# Patient Record
Sex: Male | Born: 1950
Health system: Southern US, Community
[De-identification: ages and names within clinical notes are randomized; demographics above are authoritative.]

## PROBLEM LIST (undated history)

## (undated) DIAGNOSIS — B1920 Unspecified viral hepatitis C without hepatic coma: Secondary | ICD-10-CM

## (undated) DIAGNOSIS — I10 Essential (primary) hypertension: Secondary | ICD-10-CM

## (undated) DIAGNOSIS — E7800 Pure hypercholesterolemia, unspecified: Secondary | ICD-10-CM

## (undated) DIAGNOSIS — F1020 Alcohol dependence, uncomplicated: Secondary | ICD-10-CM

## (undated) DIAGNOSIS — I219 Acute myocardial infarction, unspecified: Secondary | ICD-10-CM

## (undated) DIAGNOSIS — E119 Type 2 diabetes mellitus without complications: Secondary | ICD-10-CM

## (undated) DIAGNOSIS — N400 Enlarged prostate without lower urinary tract symptoms: Secondary | ICD-10-CM

## (undated) DIAGNOSIS — D649 Anemia, unspecified: Secondary | ICD-10-CM

## (undated) HISTORY — PX: HEMORRHOID SURGERY: SHX153

## (undated) HISTORY — PX: HERNIA REPAIR: SHX51

## (undated) HISTORY — PX: KNEE ARTHROPLASTY: SHX992

---

## 2018-12-25 ENCOUNTER — Emergency Department (HOSPITAL_COMMUNITY): Payer: No Typology Code available for payment source

## 2018-12-25 ENCOUNTER — Inpatient Hospital Stay (HOSPITAL_COMMUNITY)
Admission: EM | Admit: 2018-12-25 | Discharge: 2018-12-26 | DRG: 286 | Disposition: A | Discharge status: Home / Self Care | Payer: No Typology Code available for payment source | Attending: Cardiology | Admitting: Cardiology

## 2018-12-25 ENCOUNTER — Encounter (HOSPITAL_COMMUNITY)
Admission: EM | Disposition: A | Discharge status: Home / Self Care | Payer: Self-pay | Source: Home / Self Care | Attending: Cardiology

## 2018-12-25 ENCOUNTER — Other Ambulatory Visit: Payer: Self-pay

## 2018-12-25 ENCOUNTER — Encounter (HOSPITAL_COMMUNITY): Payer: Self-pay | Admitting: Emergency Medicine

## 2018-12-25 DIAGNOSIS — Z79899 Other long term (current) drug therapy: Secondary | ICD-10-CM

## 2018-12-25 DIAGNOSIS — E669 Obesity, unspecified: Secondary | ICD-10-CM | POA: Diagnosis present

## 2018-12-25 DIAGNOSIS — F17210 Nicotine dependence, cigarettes, uncomplicated: Secondary | ICD-10-CM | POA: Diagnosis present

## 2018-12-25 DIAGNOSIS — I5033 Acute on chronic diastolic (congestive) heart failure: Secondary | ICD-10-CM | POA: Diagnosis present

## 2018-12-25 DIAGNOSIS — I426 Alcoholic cardiomyopathy: Secondary | ICD-10-CM | POA: Diagnosis present

## 2018-12-25 DIAGNOSIS — R9431 Abnormal electrocardiogram [ECG] [EKG]: Secondary | ICD-10-CM | POA: Diagnosis present

## 2018-12-25 DIAGNOSIS — E785 Hyperlipidemia, unspecified: Secondary | ICD-10-CM | POA: Diagnosis present

## 2018-12-25 DIAGNOSIS — Z7984 Long term (current) use of oral hypoglycemic drugs: Secondary | ICD-10-CM | POA: Diagnosis not present

## 2018-12-25 DIAGNOSIS — F17200 Nicotine dependence, unspecified, uncomplicated: Secondary | ICD-10-CM | POA: Diagnosis not present

## 2018-12-25 DIAGNOSIS — I420 Dilated cardiomyopathy: Secondary | ICD-10-CM

## 2018-12-25 DIAGNOSIS — I214 Non-ST elevation (NSTEMI) myocardial infarction: Secondary | ICD-10-CM | POA: Diagnosis present

## 2018-12-25 DIAGNOSIS — I10 Essential (primary) hypertension: Secondary | ICD-10-CM

## 2018-12-25 DIAGNOSIS — Z720 Tobacco use: Secondary | ICD-10-CM | POA: Diagnosis not present

## 2018-12-25 DIAGNOSIS — I110 Hypertensive heart disease with heart failure: Secondary | ICD-10-CM | POA: Diagnosis present

## 2018-12-25 DIAGNOSIS — E7800 Pure hypercholesterolemia, unspecified: Secondary | ICD-10-CM | POA: Diagnosis present

## 2018-12-25 DIAGNOSIS — F1010 Alcohol abuse, uncomplicated: Secondary | ICD-10-CM | POA: Diagnosis not present

## 2018-12-25 DIAGNOSIS — E119 Type 2 diabetes mellitus without complications: Secondary | ICD-10-CM | POA: Diagnosis present

## 2018-12-25 DIAGNOSIS — R0789 Other chest pain: Secondary | ICD-10-CM

## 2018-12-25 DIAGNOSIS — R05 Cough: Secondary | ICD-10-CM | POA: Diagnosis present

## 2018-12-25 DIAGNOSIS — R197 Diarrhea, unspecified: Secondary | ICD-10-CM | POA: Diagnosis present

## 2018-12-25 DIAGNOSIS — I5043 Acute on chronic combined systolic (congestive) and diastolic (congestive) heart failure: Secondary | ICD-10-CM | POA: Diagnosis not present

## 2018-12-25 DIAGNOSIS — Z20828 Contact with and (suspected) exposure to other viral communicable diseases: Secondary | ICD-10-CM | POA: Diagnosis present

## 2018-12-25 DIAGNOSIS — I200 Unstable angina: Principal | ICD-10-CM | POA: Diagnosis present

## 2018-12-25 DIAGNOSIS — Z791 Long term (current) use of non-steroidal anti-inflammatories (NSAID): Secondary | ICD-10-CM

## 2018-12-25 DIAGNOSIS — F1020 Alcohol dependence, uncomplicated: Secondary | ICD-10-CM | POA: Diagnosis present

## 2018-12-25 DIAGNOSIS — R0602 Shortness of breath: Secondary | ICD-10-CM | POA: Diagnosis not present

## 2018-12-25 HISTORY — DX: Essential (primary) hypertension: I10

## 2018-12-25 HISTORY — DX: Type 2 diabetes mellitus without complications: E11.9

## 2018-12-25 HISTORY — PX: LEFT HEART CATH AND CORONARY ANGIOGRAPHY: CATH118249

## 2018-12-25 HISTORY — DX: Pure hypercholesterolemia, unspecified: E78.00

## 2018-12-25 LAB — COMPREHENSIVE METABOLIC PANEL
ALT: 34 U/L (ref 0–44)
AST: 39 U/L (ref 15–41)
Albumin: 3.5 g/dL (ref 3.5–5.0)
Alkaline Phosphatase: 60 U/L (ref 38–126)
Anion gap: 12 (ref 5–15)
BUN: 9 mg/dL (ref 8–23)
CO2: 20 mmol/L — ABNORMAL LOW (ref 22–32)
Calcium: 9.1 mg/dL (ref 8.9–10.3)
Chloride: 111 mmol/L (ref 98–111)
Creatinine, Ser: 1.22 mg/dL (ref 0.61–1.24)
GFR calc Af Amer: >60 mL/min (ref >60)
GFR calc non Af Amer: >60 mL/min (ref >60)
Glucose, Bld: 127 mg/dL — ABNORMAL HIGH (ref 70–99)
Potassium: 3.5 mmol/L (ref 3.5–5.1)
Sodium: 143 mmol/L (ref 135–145)
Total Bilirubin: 0.6 mg/dL (ref 0.3–1.2)
Total Protein: 7 g/dL (ref 6.5–8.1)

## 2018-12-25 LAB — CBC WITH DIFFERENTIAL/PLATELET
Abs Immature Granulocytes: 0.01 10*3/uL (ref 0.00–0.07)
Basophils Absolute: 0 10*3/uL (ref 0.0–0.1)
Basophils Relative: 0 %
Eosinophils Absolute: 0.2 10*3/uL (ref 0.0–0.5)
Eosinophils Relative: 3 %
HCT: 39.8 % (ref 39.0–52.0)
Hemoglobin: 12.8 g/dL — ABNORMAL LOW (ref 13.0–17.0)
Immature Granulocytes: 0 %
Lymphocytes Relative: 29 %
Lymphs Abs: 2 10*3/uL (ref 0.7–4.0)
MCH: 31.8 pg (ref 26.0–34.0)
MCHC: 32.2 g/dL (ref 30.0–36.0)
MCV: 98.8 fL (ref 80.0–100.0)
Monocytes Absolute: 0.7 10*3/uL (ref 0.1–1.0)
Monocytes Relative: 10 %
Neutro Abs: 4 10*3/uL (ref 1.7–7.7)
Neutrophils Relative %: 58 %
Platelets: 274 10*3/uL (ref 150–400)
RBC: 4.03 MIL/uL — ABNORMAL LOW (ref 4.22–5.81)
RDW: 15.3 % (ref 11.5–15.5)
WBC: 6.9 10*3/uL (ref 4.0–10.5)
nRBC: 0 % (ref 0.0–0.2)

## 2018-12-25 LAB — HEMOGLOBIN A1C
Hgb A1c MFr Bld: 6.3 % — ABNORMAL HIGH (ref 4.8–5.6)
Mean Plasma Glucose: 134.11 mg/dL

## 2018-12-25 LAB — TSH: TSH: 0.999 u[IU]/mL (ref 0.350–4.500)

## 2018-12-25 LAB — TROPONIN I (HIGH SENSITIVITY)
Troponin I (High Sensitivity): 193 ng/L (ref <18)
Troponin I (High Sensitivity): 192 ng/L (ref <18)

## 2018-12-25 LAB — D-DIMER, QUANTITATIVE: D-Dimer, Quant: 0.89 ug/mL-FEU — ABNORMAL HIGH (ref 0.00–0.50)

## 2018-12-25 LAB — LIPID PANEL
Cholesterol: 146 mg/dL (ref 0–200)
HDL: 73 mg/dL (ref >40)
LDL Cholesterol: 63 mg/dL (ref 0–99)
Total CHOL/HDL Ratio: 2 RATIO
Triglycerides: 52 mg/dL (ref <150)
VLDL: 10 mg/dL (ref 0–40)

## 2018-12-25 LAB — SARS CORONAVIRUS 2 BY RT PCR (HOSPITAL ORDER, PERFORMED IN ~~LOC~~ HOSPITAL LAB): SARS Coronavirus 2: NEGATIVE

## 2018-12-25 LAB — GLUCOSE, CAPILLARY: Glucose-Capillary: 109 mg/dL — ABNORMAL HIGH (ref 70–99)

## 2018-12-25 LAB — BRAIN NATRIURETIC PEPTIDE: B Natriuretic Peptide: 312.2 pg/mL — ABNORMAL HIGH (ref 0.0–100.0)

## 2018-12-25 SURGERY — LEFT HEART CATH AND CORONARY ANGIOGRAPHY
Anesthesia: LOCAL

## 2018-12-25 MED ORDER — HEPARIN BOLUS VIA INFUSION
4000.0000 [IU] | Freq: Once | INTRAVENOUS | Status: AC
  Administered 2018-12-25: 12:00:00 4000 [IU] via INTRAVENOUS
  Filled 2018-12-25: qty 4000

## 2018-12-25 MED ORDER — ENALAPRIL MALEATE 10 MG PO TABS
10.0000 mg | ORAL_TABLET | Freq: Every day | ORAL | Status: DC
  Administered 2018-12-25 – 2018-12-26 (×2): 10 mg via ORAL
  Filled 2018-12-25 (×2): qty 1

## 2018-12-25 MED ORDER — HEPARIN SODIUM (PORCINE) 1000 UNIT/ML IJ SOLN
INTRAMUSCULAR | Status: DC | PRN
  Administered 2018-12-25: 6000 [IU] via INTRAVENOUS

## 2018-12-25 MED ORDER — ASPIRIN 81 MG PO CHEW: 324.0000 mg | CHEWABLE_TABLET | ORAL | Status: DC

## 2018-12-25 MED ORDER — HEPARIN (PORCINE) IN NACL 1000-0.9 UT/500ML-% IV SOLN
INTRAVENOUS | Status: DC | PRN
  Administered 2018-12-25: 500 mL

## 2018-12-25 MED ORDER — FENTANYL CITRATE (PF) 100 MCG/2ML IJ SOLN
INTRAMUSCULAR | Status: DC | PRN
  Administered 2018-12-25: 50 ug via INTRAVENOUS

## 2018-12-25 MED ORDER — POTASSIUM CHLORIDE CRYS ER 10 MEQ PO TBCR
20.0000 meq | EXTENDED_RELEASE_TABLET | Freq: Two times a day (BID) | ORAL | Status: AC
  Administered 2018-12-25 – 2018-12-26 (×2): 20 meq via ORAL
  Filled 2018-12-25 (×3): qty 2

## 2018-12-25 MED ORDER — FUROSEMIDE 10 MG/ML IJ SOLN
40.0000 mg | Freq: Two times a day (BID) | INTRAMUSCULAR | Status: DC
  Administered 2018-12-26: 07:00:00 40 mg via INTRAVENOUS
  Filled 2018-12-25: qty 4

## 2018-12-25 MED ORDER — MIDAZOLAM HCL 2 MG/2ML IJ SOLN
INTRAMUSCULAR | Status: AC
  Filled 2018-12-25: qty 2

## 2018-12-25 MED ORDER — HEPARIN (PORCINE) IN NACL 1000-0.9 UT/500ML-% IV SOLN
INTRAVENOUS | Status: AC
  Filled 2018-12-25: qty 1000

## 2018-12-25 MED ORDER — SODIUM CHLORIDE 0.9% FLUSH: 3.0000 mL | INTRAVENOUS | Status: DC | PRN

## 2018-12-25 MED ORDER — ASPIRIN 300 MG RE SUPP: 300.0000 mg | RECTAL | Status: DC

## 2018-12-25 MED ORDER — SODIUM CHLORIDE 0.9% FLUSH
3.0000 mL | Freq: Two times a day (BID) | INTRAVENOUS | Status: DC
  Administered 2018-12-25: 3 mL via INTRAVENOUS

## 2018-12-25 MED ORDER — TRAZODONE HCL 50 MG PO TABS: 150.0000 mg | ORAL_TABLET | Freq: Every evening | ORAL | Status: DC | PRN

## 2018-12-25 MED ORDER — ONDANSETRON HCL 4 MG/2ML IJ SOLN: 4.0000 mg | Freq: Four times a day (QID) | INTRAMUSCULAR | Status: DC | PRN

## 2018-12-25 MED ORDER — INSULIN ASPART 100 UNIT/ML ~~LOC~~ SOLN
0.0000 [IU] | Freq: Three times a day (TID) | SUBCUTANEOUS | Status: DC
  Administered 2018-12-26: 3 [IU] via SUBCUTANEOUS

## 2018-12-25 MED ORDER — HEPARIN (PORCINE) 25000 UT/250ML-% IV SOLN
1100.0000 [IU]/h | INTRAVENOUS | Status: DC
  Administered 2018-12-25: 12:00:00 1100 [IU]/h via INTRAVENOUS
  Filled 2018-12-25: qty 250

## 2018-12-25 MED ORDER — ATORVASTATIN CALCIUM 80 MG PO TABS: 80.0000 mg | ORAL_TABLET | Freq: Every day | ORAL | Status: DC

## 2018-12-25 MED ORDER — NITROGLYCERIN 0.4 MG SL SUBL: 0.4000 mg | SUBLINGUAL_TABLET | SUBLINGUAL | Status: DC | PRN

## 2018-12-25 MED ORDER — LURASIDONE HCL 60 MG PO TABS: 60.0000 mg | ORAL_TABLET | Freq: Every day | ORAL | Status: DC

## 2018-12-25 MED ORDER — LIDOCAINE HCL (PF) 1 % IJ SOLN
INTRAMUSCULAR | Status: DC | PRN
  Administered 2018-12-25: 5 mL

## 2018-12-25 MED ORDER — SODIUM CHLORIDE 0.9 % IV BOLUS
1000.0000 mL | Freq: Once | INTRAVENOUS | Status: AC
  Administered 2018-12-25: 10:00:00 1000 mL via INTRAVENOUS

## 2018-12-25 MED ORDER — ASPIRIN EC 81 MG PO TBEC: 81.0000 mg | DELAYED_RELEASE_TABLET | Freq: Every day | ORAL | Status: DC

## 2018-12-25 MED ORDER — LIDOCAINE HCL (PF) 1 % IJ SOLN
INTRAMUSCULAR | Status: AC
  Filled 2018-12-25: qty 30

## 2018-12-25 MED ORDER — DIAZEPAM 5 MG PO TABS
10.0000 mg | ORAL_TABLET | Freq: Three times a day (TID) | ORAL | Status: DC
  Administered 2018-12-25: 10 mg via ORAL
  Filled 2018-12-25 (×2): qty 2

## 2018-12-25 MED ORDER — FENTANYL CITRATE (PF) 100 MCG/2ML IJ SOLN
INTRAMUSCULAR | Status: AC
  Filled 2018-12-25: qty 2

## 2018-12-25 MED ORDER — HYDRALAZINE HCL 20 MG/ML IJ SOLN
10.0000 mg | INTRAMUSCULAR | Status: AC | PRN
  Administered 2018-12-25 (×2): 10 mg via INTRAVENOUS
  Filled 2018-12-25: qty 1

## 2018-12-25 MED ORDER — VERAPAMIL HCL 2.5 MG/ML IV SOLN
INTRAVENOUS | Status: DC | PRN
  Administered 2018-12-25: 18:00:00 5 mL via INTRA_ARTERIAL

## 2018-12-25 MED ORDER — VERAPAMIL HCL 2.5 MG/ML IV SOLN
INTRAVENOUS | Status: AC
  Filled 2018-12-25: qty 2

## 2018-12-25 MED ORDER — PANTOPRAZOLE SODIUM 40 MG PO TBEC
40.0000 mg | DELAYED_RELEASE_TABLET | Freq: Two times a day (BID) | ORAL | Status: DC
  Administered 2018-12-26: 09:00:00 40 mg via ORAL
  Filled 2018-12-25: qty 1

## 2018-12-25 MED ORDER — MIDAZOLAM HCL 2 MG/2ML IJ SOLN
INTRAMUSCULAR | Status: DC | PRN
  Administered 2018-12-25: 2 mg via INTRAVENOUS

## 2018-12-25 MED ORDER — AMLODIPINE BESYLATE 5 MG PO TABS
5.0000 mg | ORAL_TABLET | Freq: Every day | ORAL | Status: DC
  Administered 2018-12-26: 11:00:00 5 mg via ORAL
  Filled 2018-12-25: qty 1

## 2018-12-25 MED ORDER — ASPIRIN 81 MG PO CHEW
324.0000 mg | CHEWABLE_TABLET | Freq: Once | ORAL | Status: AC
  Administered 2018-12-25: 324 mg via ORAL
  Filled 2018-12-25: qty 4

## 2018-12-25 MED ORDER — ENALAPRIL MALEATE 10 MG PO TABS
10.0000 mg | ORAL_TABLET | Freq: Every day | ORAL | Status: DC
  Filled 2018-12-25: qty 1

## 2018-12-25 MED ORDER — SODIUM CHLORIDE 0.9 % IV SOLN: 250.0000 mL | INTRAVENOUS | Status: DC | PRN

## 2018-12-25 MED ORDER — HEPARIN SODIUM (PORCINE) 1000 UNIT/ML IJ SOLN
INTRAMUSCULAR | Status: AC
  Filled 2018-12-25: qty 1

## 2018-12-25 MED ORDER — IOHEXOL 350 MG/ML SOLN
INTRAVENOUS | Status: DC | PRN
  Administered 2018-12-25: 18:00:00 70 mL via INTRA_ARTERIAL

## 2018-12-25 MED ORDER — METOPROLOL TARTRATE 25 MG PO TABS
25.0000 mg | ORAL_TABLET | Freq: Two times a day (BID) | ORAL | Status: DC
  Administered 2018-12-26: 11:00:00 25 mg via ORAL
  Filled 2018-12-25 (×2): qty 1

## 2018-12-25 MED ORDER — GLIPIZIDE 5 MG PO TABS
5.0000 mg | ORAL_TABLET | Freq: Two times a day (BID) | ORAL | Status: DC
  Administered 2018-12-26: 5 mg via ORAL
  Filled 2018-12-25 (×2): qty 1

## 2018-12-25 MED ORDER — ACETAMINOPHEN 325 MG PO TABS
650.0000 mg | ORAL_TABLET | ORAL | Status: DC | PRN
  Administered 2018-12-25: 21:00:00 650 mg via ORAL
  Filled 2018-12-25: qty 2

## 2018-12-25 MED ORDER — IOHEXOL 350 MG/ML SOLN
100.0000 mL | Freq: Once | INTRAVENOUS | Status: AC | PRN
  Administered 2018-12-25: 13:00:00 100 mL via INTRAVENOUS

## 2018-12-25 MED ORDER — LABETALOL HCL 5 MG/ML IV SOLN
10.0000 mg | INTRAVENOUS | Status: AC | PRN
  Administered 2018-12-25: 20:00:00 10 mg via INTRAVENOUS
  Filled 2018-12-25: qty 4

## 2018-12-25 SURGICAL SUPPLY — 10 items
CATH OPTITORQUE TIG 4.0 5F (CATHETERS) ×1 IMPLANT
DEVICE RAD COMP TR BAND LRG (VASCULAR PRODUCTS) ×1 IMPLANT
GLIDESHEATH SLEND A-KIT 6F 22G (SHEATH) ×1 IMPLANT
GUIDEWIRE INQWIRE 1.5J.035X260 (WIRE) IMPLANT
INQWIRE 1.5J .035X260CM (WIRE) ×4
KIT HEART LEFT (KITS) ×2 IMPLANT
PACK CARDIAC CATHETERIZATION (CUSTOM PROCEDURE TRAY) ×2 IMPLANT
SHEATH PROBE COVER 6X72 (BAG) ×1 IMPLANT
TRANSDUCER W/STOPCOCK (MISCELLANEOUS) ×2 IMPLANT
TUBING CIL FLEX 10 FLL-RA (TUBING) ×2 IMPLANT

## 2018-12-25 NOTE — Progress Notes (Signed)
TR BAND REMOVAL  LOCATION:    right radial  DEFLATED PER PROTOCOL:    Yes.    TIME BAND OFF / DRESSING APPLIED:    2145   SITE UPON ARRIVAL:    Level 0  SITE AFTER BAND REMOVAL:    Level 0  CIRCULATION SENSATION AND MOVEMENT:    Within Normal Limits   Yes.    COMMENTS:   Pt.tolerated well .No bleeding or hematoma noted.
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
## 2018-12-25 NOTE — Progress Notes (Signed)
ANTICOAGULATION CONSULT NOTE - Initial Consult  Pharmacy Consult for heparin  Indication: chest pain/ACS  No Known Allergies  Patient Measurements: Patient notes current weight to be estimated at 109kg  Vital Signs: Temp: 98.4 F (36.9 C) (06/26 0916) Temp Source: Oral (06/26 0916) BP: 161/111 (06/26 1000) Pulse Rate: 89 (06/26 0945)  Labs: Recent Labs    12/25/18 1007  HGB 12.8*  HCT 39.8  PLT 274  CREATININE 1.22  TROPONINIHS 193*    CrCl cannot be calculated (Unknown ideal weight.).   Medical History: History reviewed. No pertinent past medical history.   Assessment: Corey Meyer is a 68yo male admitted with SOB and chest pain, starting a few nights ago. Pharmacy consulted for heparin infusion. Hgb 12.8 and pltc 274  Goal of Therapy:  Heparin level 0.3-0.7 units/ml Monitor platelets by anticoagulation protocol: Yes   Plan:  Heparin bolus 4000 x1 now Start heparin infusion at 1100 units/hr Heparin level at 1800 Daily heparin level, CBC, monitor for s/sx bleeding  Thank you for involving pharmacy in this patient's care.  Janae Bridgeman, PharmD PGY1 Pharmacy Resident Phone: [phone] 12/25/2018 11:09 AM
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
## 2018-12-25 NOTE — ED Triage Notes (Addendum)
Pt here with c/o shob, cough, and diarrhea. Pt called [facility] and they instructed him to come straight to ED for Covid evaluation.  Pt states he also consumes 3.5 gallons of alcohol week. Last drink this AM. Also endorses smoking a pack of cigarettes a day.
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
## 2018-12-25 NOTE — ED Notes (Signed)
Pt transferred to cath lab. 6E notified of transfer. Report given to cath lab RN.

## 2018-12-26 ENCOUNTER — Inpatient Hospital Stay (HOSPITAL_COMMUNITY): Payer: No Typology Code available for payment source

## 2018-12-26 DIAGNOSIS — I110 Hypertensive heart disease with heart failure: Secondary | ICD-10-CM

## 2018-12-26 DIAGNOSIS — Z720 Tobacco use: Secondary | ICD-10-CM

## 2018-12-26 DIAGNOSIS — F1010 Alcohol abuse, uncomplicated: Secondary | ICD-10-CM

## 2018-12-26 LAB — BASIC METABOLIC PANEL
Anion gap: 9 (ref 5–15)
BUN: 10 mg/dL (ref 8–23)
CO2: 21 mmol/L — ABNORMAL LOW (ref 22–32)
Calcium: 8.8 mg/dL — ABNORMAL LOW (ref 8.9–10.3)
Chloride: 105 mmol/L (ref 98–111)
Creatinine, Ser: 1.18 mg/dL (ref 0.61–1.24)
GFR calc Af Amer: >60 mL/min (ref >60)
GFR calc non Af Amer: >60 mL/min (ref >60)
Glucose, Bld: 156 mg/dL — ABNORMAL HIGH (ref 70–99)
Potassium: 3.3 mmol/L — ABNORMAL LOW (ref 3.5–5.1)
Sodium: 135 mmol/L (ref 135–145)

## 2018-12-26 LAB — ECHOCARDIOGRAM COMPLETE: Weight: 3840 oz

## 2018-12-26 LAB — CBC
HCT: 36 % — ABNORMAL LOW (ref 39.0–52.0)
Hemoglobin: 12.2 g/dL — ABNORMAL LOW (ref 13.0–17.0)
MCH: 32.4 pg (ref 26.0–34.0)
MCHC: 33.9 g/dL (ref 30.0–36.0)
MCV: 95.7 fL (ref 80.0–100.0)
Platelets: 265 10*3/uL (ref 150–400)
RBC: 3.76 MIL/uL — ABNORMAL LOW (ref 4.22–5.81)
RDW: 15 % (ref 11.5–15.5)
WBC: 8.3 10*3/uL (ref 4.0–10.5)
nRBC: 0 % (ref 0.0–0.2)

## 2018-12-26 LAB — HIV ANTIBODY (ROUTINE TESTING W REFLEX): HIV Screen 4th Generation wRfx: NONREACTIVE

## 2018-12-26 LAB — GLUCOSE, CAPILLARY: Glucose-Capillary: 200 mg/dL — ABNORMAL HIGH (ref 70–99)

## 2018-12-26 MED ORDER — ENALAPRIL MALEATE 20 MG PO TABS: 20.0000 mg | ORAL_TABLET | Freq: Every evening | ORAL | 0 refills | Status: DC

## 2018-12-26 MED ORDER — TRIAMTERENE-HCTZ 37.5-25 MG PO TABS: 1.0000 | ORAL_TABLET | ORAL | 1 refills | Status: DC

## 2018-12-26 MED ORDER — AMLODIPINE BESYLATE 10 MG PO TABS: 10.0000 mg | ORAL_TABLET | Freq: Every evening | ORAL | 1 refills | Status: DC

## 2018-12-26 MED ORDER — METOPROLOL SUCCINATE ER 100 MG PO TB24: 100.0000 mg | ORAL_TABLET | Freq: Every day | ORAL | 1 refills | Status: DC

## 2018-12-26 NOTE — Discharge Instructions (Signed)
Radial Site Care ° °This sheet gives you information about how to care for yourself after your procedure. Your health care provider may also give you more specific instructions. If you have problems or questions, contact your health care provider. °What can I expect after the procedure? °After the procedure, it is common to have: °· Bruising and tenderness at the catheter insertion area. °Follow these instructions at home: °Medicines °· Take over-the-counter and prescription medicines only as told by your health care provider. °Insertion site care °· Follow instructions from your health care provider about how to take care of your insertion site. Make sure you: °? Wash your hands with soap and water before you change your bandage (dressing). If soap and water are not available, use hand sanitizer. °? Change your dressing as told by your health care provider. °? Leave stitches (sutures), skin glue, or adhesive strips in place. These skin closures may need to stay in place for 2 weeks or longer. If adhesive strip edges start to loosen and curl up, you may trim the loose edges. Do not remove adhesive strips completely unless your health care provider tells you to do that. °· Check your insertion site every day for signs of infection. Check for: °? Redness, swelling, or pain. °? Fluid or blood. °? Pus or a bad smell. °? Warmth. °· Do not take baths, swim, or use a hot tub until your health care provider approves. °· You may shower 24-48 hours after the procedure, or as directed by your health care provider. °? Remove the dressing and gently wash the site with plain soap and water. °? Pat the area dry with a clean towel. °? Do not rub the site. That could cause bleeding. °· Do not apply powder or lotion to the site. °Activity ° °· For 24 hours after the procedure, or as directed by your health care provider: °? Do not flex or bend the affected arm. °? Do not push or pull heavy objects with the affected arm. °? Do not  drive yourself home from the hospital or clinic. You may drive 24 hours after the procedure unless your health care provider tells you not to. °? Do not operate machinery or power tools. °· Do not lift anything that is heavier than 10 lb (4.5 kg), or the limit that you are told, until your health care provider says that it is safe. °· Ask your health care provider when it is okay to: °? Return to work or school. °? Resume usual physical activities or sports. °? Resume sexual activity. °General instructions °· If the catheter site starts to bleed, raise your arm and put firm pressure on the site. If the bleeding does not stop, get help right away. This is a medical emergency. °· If you went home on the same day as your procedure, a responsible adult should be with you for the first 24 hours after you arrive home. °· Keep all follow-up visits as told by your health care provider. This is important. °Contact a health care provider if: °· You have a fever. °· You have redness, swelling, or yellow drainage around your insertion site. °Get help right away if: °· You have unusual pain at the radial site. °· The catheter insertion area swells very fast. °· The insertion area is bleeding, and the bleeding does not stop when you hold steady pressure on the area. °· Your arm or hand becomes pale, cool, tingly, or numb. °These symptoms may represent a serious problem   that is an emergency. Do not wait to see if the symptoms will go away. Get medical help right away. Call your local emergency services (911 in the [place] Do not drive yourself to the hospital. Summary  After the procedure, it is common to have bruising and tenderness at the site.  Follow instructions from your health care provider about how to take care of your radial site wound. Check the wound every day for signs of infection.  Do not lift anything that is heavier than 10 lb (4.5 kg), or the limit that you are told, until your health care provider says  that it is safe. This information is not intended to replace advice given to you by your health care provider. Make sure you discuss any questions you have with your health care provider. Document Released: 07/20/2010 Document Revised: 07/23/2017 Document Reviewed: 07/23/2017 Elsevier Patient Education  2020 [employer]   Hypertension, Adult Hypertension is another name for high blood pressure. High blood pressure forces your heart to work harder to pump blood. This can cause problems over time. There are two numbers in a blood pressure reading. There is a top number (systolic) over a bottom number (diastolic). It is best to have a blood pressure that is below 120/80. Healthy choices can help lower your blood pressure, or you may need medicine to help lower it. What are the causes? The cause of this condition is not known. Some conditions may be related to high blood pressure. What increases the risk?  Smoking.  Having type 2 diabetes mellitus, high cholesterol, or both.  Not getting enough exercise or physical activity.  Being overweight.  Having too much fat, sugar, calories, or salt (sodium) in your diet.  Drinking too much alcohol.  Having long-term (chronic) kidney disease.  Having a family history of high blood pressure.  Age. Risk increases with age.  Race. You may be at higher risk if you are African American.  Gender. Men are at higher risk than women before age 66. After age 66, women are at higher risk than men.  Having obstructive sleep apnea.  Stress. What are the signs or symptoms?  High blood pressure may not cause symptoms. Very high blood pressure (hypertensive crisis) may cause: ? Headache. ? Feelings of worry or nervousness (anxiety). ? Shortness of breath. ? Nosebleed. ? A feeling of being sick to your stomach (nausea). ? Throwing up (vomiting). ? Changes in how you see. ? Very bad chest pain. ? Seizures. How is this treated?  This condition  is treated by making healthy lifestyle changes, such as: ? Eating healthy foods. ? Exercising more. ? Drinking less alcohol.  Your health care provider may prescribe medicine if lifestyle changes are not enough to get your blood pressure under control, and if: ? Your top number is above 130. ? Your bottom number is above 80.  Your personal target blood pressure may vary. Follow these instructions at home: Eating and drinking   If told, follow the DASH eating plan. To follow this plan: ? Fill one half of your plate at each meal with fruits and vegetables. ? Fill one fourth of your plate at each meal with whole grains. Whole grains include whole-wheat pasta, brown rice, and whole-grain bread. ? Eat or drink low-fat dairy products, such as skim milk or low-fat yogurt. ? Fill one fourth of your plate at each meal with low-fat (lean) proteins. Low-fat proteins include fish, chicken without skin, eggs, beans, and tofu. ? Avoid fatty  meat, cured and processed meat, or chicken with skin. ? Avoid pre-made or processed food.  Eat less than 1,500 mg of salt each day.  Do not drink alcohol if: ? Your doctor tells you not to drink. ? You are pregnant, may be pregnant, or are planning to become pregnant.  If you drink alcohol: ? Limit how much you use to:  0-1 drink a day for women.  0-2 drinks a day for men. ? Be aware of how much alcohol is in your drink. In the [place] one drink equals one 12 oz bottle of beer (355 mL), one 5 oz glass of wine (148 mL), or one 1 oz glass of hard liquor (44 mL). Lifestyle   Work with your doctor to stay at a healthy weight or to lose weight. Ask your doctor what the best weight is for you.  Get at least 30 minutes of exercise most days of the week. This may include walking, swimming, or biking.  Get at least 30 minutes of exercise that strengthens your muscles (resistance exercise) at least 3 days a week. This may include lifting weights or doing  Pilates.  Do not use any products that contain nicotine or tobacco, such as cigarettes, e-cigarettes, and chewing tobacco. If you need help quitting, ask your doctor.  Check your blood pressure at home as told by your doctor.  Keep all follow-up visits as told by your doctor. This is important. Medicines  Take over-the-counter and prescription medicines only as told by your doctor. Follow directions carefully.  Do not skip doses of blood pressure medicine. The medicine does not work as well if you skip doses. Skipping doses also puts you at risk for problems.  Ask your doctor about side effects or reactions to medicines that you should watch for. Contact a doctor if you:  Think you are having a reaction to the medicine you are taking.  Have headaches that keep coming back (recurring).  Feel dizzy.  Have swelling in your ankles.  Have trouble with your vision. Get help right away if you:  Get a very bad headache.  Start to feel mixed up (confused).  Feel weak or numb.  Feel faint.  Have very bad pain in your: ? Chest. ? Belly (abdomen).  Throw up more than once.  Have trouble breathing. Summary  Hypertension is another name for high blood pressure.  High blood pressure forces your heart to work harder to pump blood.  For most people, a normal blood pressure is less than 120/80.  Making healthy choices can help lower blood pressure. If your blood pressure does not get lower with healthy choices, you may need to take medicine. This information is not intended to replace advice given to you by your health care provider. Make sure you discuss any questions you have with your health care provider. Document Released: 12/04/2007 Document Revised: 02/25/2018 Document Reviewed: 02/25/2018 Elsevier Patient Education  2020 Elsevier Inc.    Diabetes Mellitus and Nutrition, Adult When you have diabetes (diabetes mellitus), it is very important to have healthy eating  habits because your blood sugar (glucose) levels are greatly affected by what you eat and drink. Eating healthy foods in the appropriate amounts, at about the same times every day, can help you:  Control your blood glucose.  Lower your risk of heart disease.  Improve your blood pressure.  Reach or maintain a healthy weight. Every person with diabetes is different, and each person has different needs for a meal  plan. Your health care provider may recommend that you work with a diet and nutrition specialist (dietitian) to make a meal plan that is best for you. Your meal plan may vary depending on factors such as:  The calories you need.  The medicines you take.  Your weight.  Your blood glucose, blood pressure, and cholesterol levels.  Your activity level.  Other health conditions you have, such as heart or kidney disease. How do carbohydrates affect me? Carbohydrates, also called carbs, affect your blood glucose level more than any other type of food. Eating carbs naturally raises the amount of glucose in your blood. Carb counting is a method for keeping track of how many carbs you eat. Counting carbs is important to keep your blood glucose at a healthy level, especially if you use insulin or take certain oral diabetes medicines. It is important to know how many carbs you can safely have in each meal. This is different for every person. Your dietitian can help you calculate how many carbs you should have at each meal and for each snack. Foods that contain carbs include:  Bread, cereal, rice, pasta, and crackers.  Potatoes and corn.  Peas, beans, and lentils.  Milk and yogurt.  Fruit and juice.  Desserts, such as cakes, cookies, ice cream, and candy. How does alcohol affect me? Alcohol can cause a sudden decrease in blood glucose (hypoglycemia), especially if you use insulin or take certain oral diabetes medicines. Hypoglycemia can be a life-threatening condition. Symptoms of  hypoglycemia (sleepiness, dizziness, and confusion) are similar to symptoms of having too much alcohol. If your health care provider says that alcohol is safe for you, follow these guidelines:  Limit alcohol intake to no more than 1 drink per day for nonpregnant women and 2 drinks per day for men. One drink equals 12 oz of beer, 5 oz of wine, or 1 oz of hard liquor.  Do not drink on an empty stomach.  Keep yourself hydrated with water, diet soda, or unsweetened iced tea.  Keep in mind that regular soda, juice, and other mixers may contain a lot of sugar and must be counted as carbs. What are tips for following this plan?  Reading food labels  Start by checking the serving size on the "Nutrition Facts" label of packaged foods and drinks. The amount of calories, carbs, fats, and other nutrients listed on the label is based on one serving of the item. Many items contain more than one serving per package.  Check the total grams (g) of carbs in one serving. You can calculate the number of servings of carbs in one serving by dividing the total carbs by 15. For example, if a food has 30 g of total carbs, it would be equal to 2 servings of carbs.  Check the number of grams (g) of saturated and trans fats in one serving. Choose foods that have low or no amount of these fats.  Check the number of milligrams (mg) of salt (sodium) in one serving. Most people should limit total sodium intake to less than 2,300 mg per day.  Always check the nutrition information of foods labeled as "low-fat" or "nonfat". These foods may be higher in added sugar or refined carbs and should be avoided.  Talk to your dietitian to identify your daily goals for nutrients listed on the label. Shopping  Avoid buying canned, premade, or processed foods. These foods tend to be high in fat, sodium, and added sugar.  Shop around the  outside edge of the grocery store. This includes fresh fruits and vegetables, bulk grains, fresh  meats, and fresh dairy. Cooking  Use low-heat cooking methods, such as baking, instead of high-heat cooking methods like deep frying.  Cook using healthy oils, such as olive, canola, or sunflower oil.  Avoid cooking with butter, cream, or high-fat meats. Meal planning  Eat meals and snacks regularly, preferably at the same times every day. Avoid going long periods of time without eating.  Eat foods high in fiber, such as fresh fruits, vegetables, beans, and whole grains. Talk to your dietitian about how many servings of carbs you can eat at each meal.  Eat 4-6 ounces (oz) of lean protein each day, such as lean meat, chicken, fish, eggs, or tofu. One oz of lean protein is equal to: ? 1 oz of meat, chicken, or fish. ? 1 egg. ?  cup of tofu.  Eat some foods each day that contain healthy fats, such as avocado, nuts, seeds, and fish. Lifestyle  Check your blood glucose regularly.  Exercise regularly as told by your health care provider. This may include: ? 150 minutes of moderate-intensity or vigorous-intensity exercise each week. This could be brisk walking, biking, or water aerobics. ? Stretching and doing strength exercises, such as yoga or weightlifting, at least 2 times a week.  Take medicines as told by your health care provider.  Do not use any products that contain nicotine or tobacco, such as cigarettes and e-cigarettes. If you need help quitting, ask your health care provider.  Work with a Veterinary surgeoncounselor or diabetes educator to identify strategies to manage stress and any emotional and social challenges. Questions to ask a health care provider  Do I need to meet with a diabetes educator?  Do I need to meet with a dietitian?  What number can I call if I have questions?  When are the best times to check my blood glucose? Where to find more information:  American Diabetes Association: diabetes.org  Academy of Nutrition and Dietetics: www.eatright.[employer]  National Institute  of Diabetes and Digestive and Kidney Diseases (NIH): Car[employer].tnwww.niddk.nih.gov Summary  A healthy meal plan will help you control your blood glucose and maintain a healthy lifestyle.  Working with a diet and nutrition specialist (dietitian) can help you make a meal plan that is best for you.  Keep in mind that carbohydrates (carbs) and alcohol have immediate effects on your blood glucose levels. It is important to count carbs and to use alcohol carefully. This information is not intended to replace advice given to you by your health care provider. Make sure you discuss any questions you have with your health care provider. Document Released: 03/14/2005 Document Revised: 05/30/2017 Document Reviewed: 07/22/2016 Elsevier Patient Education  2020 [employer]

## 2018-12-26 NOTE — Progress Notes (Signed)
  Echocardiogram 2D Echocardiogram has been performed.  Corey Meyer 12/26/2018, 10:05 AM
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
## 2018-12-26 NOTE — Progress Notes (Signed)
Discharge order received.  Patient's BP=155/108 at 1039.  Dr Einar Gip stated he was ok with this BP at discharge.  Discharge instructions reviewed with and given to patient. IVs and telemetry removed. Patient insistent on walking outside to wait on ride.

## 2018-12-28 ENCOUNTER — Encounter (HOSPITAL_COMMUNITY): Payer: Self-pay | Admitting: Cardiology

## 2019-01-04 ENCOUNTER — Other Ambulatory Visit: Payer: Self-pay

## 2019-01-04 ENCOUNTER — Inpatient Hospital Stay (HOSPITAL_COMMUNITY)
Admission: EM | Admit: 2019-01-04 | Discharge: 2019-01-06 | DRG: 293 | Disposition: A | Discharge status: Home / Self Care | Payer: No Typology Code available for payment source | Attending: Cardiology | Admitting: Cardiology

## 2019-01-04 ENCOUNTER — Emergency Department (HOSPITAL_COMMUNITY): Payer: No Typology Code available for payment source

## 2019-01-04 ENCOUNTER — Encounter (HOSPITAL_COMMUNITY): Payer: Self-pay | Admitting: Emergency Medicine

## 2019-01-04 DIAGNOSIS — Z79899 Other long term (current) drug therapy: Secondary | ICD-10-CM

## 2019-01-04 DIAGNOSIS — I110 Hypertensive heart disease with heart failure: Secondary | ICD-10-CM | POA: Diagnosis not present

## 2019-01-04 DIAGNOSIS — Y92239 Unspecified place in hospital as the place of occurrence of the external cause: Secondary | ICD-10-CM | POA: Diagnosis present

## 2019-01-04 DIAGNOSIS — E119 Type 2 diabetes mellitus without complications: Secondary | ICD-10-CM | POA: Diagnosis present

## 2019-01-04 DIAGNOSIS — F1010 Alcohol abuse, uncomplicated: Secondary | ICD-10-CM | POA: Diagnosis not present

## 2019-01-04 DIAGNOSIS — E669 Obesity, unspecified: Secondary | ICD-10-CM | POA: Diagnosis present

## 2019-01-04 DIAGNOSIS — I509 Heart failure, unspecified: Secondary | ICD-10-CM

## 2019-01-04 DIAGNOSIS — Z6835 Body mass index (BMI) 35.0-35.9, adult: Secondary | ICD-10-CM

## 2019-01-04 DIAGNOSIS — F17210 Nicotine dependence, cigarettes, uncomplicated: Secondary | ICD-10-CM | POA: Diagnosis present

## 2019-01-04 DIAGNOSIS — R0902 Hypoxemia: Secondary | ICD-10-CM | POA: Diagnosis present

## 2019-01-04 DIAGNOSIS — I5021 Acute systolic (congestive) heart failure: Secondary | ICD-10-CM

## 2019-01-04 DIAGNOSIS — I426 Alcoholic cardiomyopathy: Secondary | ICD-10-CM | POA: Diagnosis present

## 2019-01-04 DIAGNOSIS — T502X5A Adverse effect of carbonic-anhydrase inhibitors, benzothiadiazides and other diuretics, initial encounter: Secondary | ICD-10-CM | POA: Diagnosis present

## 2019-01-04 DIAGNOSIS — F1020 Alcohol dependence, uncomplicated: Secondary | ICD-10-CM | POA: Diagnosis present

## 2019-01-04 DIAGNOSIS — I5043 Acute on chronic combined systolic (congestive) and diastolic (congestive) heart failure: Secondary | ICD-10-CM | POA: Diagnosis not present

## 2019-01-04 DIAGNOSIS — Z7984 Long term (current) use of oral hypoglycemic drugs: Secondary | ICD-10-CM

## 2019-01-04 DIAGNOSIS — E785 Hyperlipidemia, unspecified: Secondary | ICD-10-CM | POA: Diagnosis present

## 2019-01-04 DIAGNOSIS — Z20828 Contact with and (suspected) exposure to other viral communicable diseases: Secondary | ICD-10-CM | POA: Diagnosis present

## 2019-01-04 DIAGNOSIS — E7800 Pure hypercholesterolemia, unspecified: Secondary | ICD-10-CM | POA: Diagnosis present

## 2019-01-04 DIAGNOSIS — Z9111 Patient's noncompliance with dietary regimen: Secondary | ICD-10-CM

## 2019-01-04 DIAGNOSIS — I420 Dilated cardiomyopathy: Secondary | ICD-10-CM | POA: Diagnosis present

## 2019-01-04 LAB — BASIC METABOLIC PANEL
Anion gap: 12 (ref 5–15)
BUN: 12 mg/dL (ref 8–23)
CO2: 23 mmol/L (ref 22–32)
Calcium: 9.4 mg/dL (ref 8.9–10.3)
Chloride: 107 mmol/L (ref 98–111)
Creatinine, Ser: 1.47 mg/dL — ABNORMAL HIGH (ref 0.61–1.24)
GFR calc Af Amer: 57 mL/min — ABNORMAL LOW (ref >60)
GFR calc non Af Amer: 49 mL/min — ABNORMAL LOW (ref >60)
Glucose, Bld: 181 mg/dL — ABNORMAL HIGH (ref 70–99)
Potassium: 4 mmol/L (ref 3.5–5.1)
Sodium: 142 mmol/L (ref 135–145)

## 2019-01-04 LAB — CBC
HCT: 43.3 % (ref 39.0–52.0)
Hemoglobin: 14 g/dL (ref 13.0–17.0)
MCH: 31.8 pg (ref 26.0–34.0)
MCHC: 32.3 g/dL (ref 30.0–36.0)
MCV: 98.4 fL (ref 80.0–100.0)
Platelets: 325 10*3/uL (ref 150–400)
RBC: 4.4 MIL/uL (ref 4.22–5.81)
RDW: 15.4 % (ref 11.5–15.5)
WBC: 7.8 10*3/uL (ref 4.0–10.5)
nRBC: 0 % (ref 0.0–0.2)

## 2019-01-04 LAB — BRAIN NATRIURETIC PEPTIDE: B Natriuretic Peptide: 670.8 pg/mL — ABNORMAL HIGH (ref 0.0–100.0)

## 2019-01-04 LAB — SARS CORONAVIRUS 2 BY RT PCR (HOSPITAL ORDER, PERFORMED IN ~~LOC~~ HOSPITAL LAB): SARS Coronavirus 2: NEGATIVE

## 2019-01-04 LAB — TROPONIN I (HIGH SENSITIVITY): Troponin I (High Sensitivity): 154 ng/L (ref <18)

## 2019-01-04 LAB — MRSA PCR SCREENING: MRSA by PCR: NEGATIVE

## 2019-01-04 MED ORDER — SODIUM CHLORIDE 0.9% FLUSH: 3.0000 mL | INTRAVENOUS | Status: DC | PRN

## 2019-01-04 MED ORDER — ONDANSETRON HCL 4 MG/2ML IJ SOLN: 4.0000 mg | Freq: Four times a day (QID) | INTRAMUSCULAR | Status: DC | PRN

## 2019-01-04 MED ORDER — AMLODIPINE BESYLATE 10 MG PO TABS
10.0000 mg | ORAL_TABLET | Freq: Every evening | ORAL | Status: DC
  Administered 2019-01-04 – 2019-01-05 (×2): 10 mg via ORAL
  Filled 2019-01-04 (×2): qty 1

## 2019-01-04 MED ORDER — METOPROLOL SUCCINATE ER 100 MG PO TB24
100.0000 mg | ORAL_TABLET | Freq: Every day | ORAL | Status: DC
  Administered 2019-01-04 – 2019-01-06 (×3): 100 mg via ORAL
  Filled 2019-01-04 (×3): qty 1

## 2019-01-04 MED ORDER — POTASSIUM CHLORIDE CRYS ER 20 MEQ PO TBCR: 20.0000 meq | EXTENDED_RELEASE_TABLET | Freq: Three times a day (TID) | ORAL | Status: DC

## 2019-01-04 MED ORDER — SODIUM CHLORIDE 0.9% FLUSH
3.0000 mL | Freq: Two times a day (BID) | INTRAVENOUS | Status: DC
  Administered 2019-01-04 – 2019-01-05 (×4): 3 mL via INTRAVENOUS

## 2019-01-04 MED ORDER — FUROSEMIDE 10 MG/ML IJ SOLN: 40.0000 mg | Freq: Three times a day (TID) | INTRAMUSCULAR | Status: DC

## 2019-01-04 MED ORDER — SODIUM CHLORIDE 0.9% FLUSH: 3.0000 mL | Freq: Once | INTRAVENOUS | Status: DC

## 2019-01-04 MED ORDER — SPIRONOLACTONE 25 MG PO TABS
25.0000 mg | ORAL_TABLET | Freq: Every day | ORAL | Status: DC
  Administered 2019-01-04 – 2019-01-06 (×3): 25 mg via ORAL
  Filled 2019-01-04 (×3): qty 1

## 2019-01-04 MED ORDER — FUROSEMIDE 10 MG/ML IJ SOLN
20.0000 mg | Freq: Two times a day (BID) | INTRAMUSCULAR | Status: DC
  Administered 2019-01-04 – 2019-01-05 (×3): 20 mg via INTRAVENOUS
  Filled 2019-01-04 (×3): qty 2

## 2019-01-04 MED ORDER — FUROSEMIDE 10 MG/ML IJ SOLN
40.0000 mg | INTRAMUSCULAR | Status: AC
  Administered 2019-01-04: 40 mg via INTRAVENOUS
  Filled 2019-01-04: qty 4

## 2019-01-04 MED ORDER — ACETAMINOPHEN 325 MG PO TABS: 650.0000 mg | ORAL_TABLET | ORAL | Status: DC | PRN

## 2019-01-04 MED ORDER — HEPARIN SODIUM (PORCINE) 5000 UNIT/ML IJ SOLN
5000.0000 [IU] | Freq: Three times a day (TID) | INTRAMUSCULAR | Status: DC
  Administered 2019-01-04 – 2019-01-06 (×6): 5000 [IU] via SUBCUTANEOUS
  Filled 2019-01-04 (×7): qty 1

## 2019-01-04 MED ORDER — ENALAPRIL MALEATE 20 MG PO TABS
20.0000 mg | ORAL_TABLET | Freq: Every evening | ORAL | Status: DC
  Administered 2019-01-04 – 2019-01-05 (×2): 20 mg via ORAL
  Filled 2019-01-04 (×3): qty 1

## 2019-01-04 MED ORDER — SODIUM CHLORIDE 0.9 % IV SOLN: 250.0000 mL | INTRAVENOUS | Status: DC | PRN

## 2019-01-04 MED ORDER — SIMVASTATIN 20 MG PO TABS
20.0000 mg | ORAL_TABLET | Freq: Every day | ORAL | Status: DC
  Administered 2019-01-04 – 2019-01-05 (×2): 20 mg via ORAL
  Filled 2019-01-04 (×2): qty 1

## 2019-01-04 NOTE — ED Notes (Signed)
Attempted to call report to 2C03, no answer will re-attempt 5-10 minutes

## 2019-01-04 NOTE — ED Notes (Signed)
Attempted to give report, nurse to give call back

## 2019-01-04 NOTE — TOC Initial Note (Signed)
Transition of Care [facility]) - Initial/Assessment Note    Patient Details  Name: Malakie Balis MRN: [id number] Date of Birth: 1951/04/13  Transition of Care [facility]) CM/SW Contact:    Midge Minium RN, BSN, NCM-BC, ACM-RN [phone] Phone Number: 01/04/2019, 2:06 PM  Clinical Narrative:                 CM consult acknowledged for HF/HH screening. The patient follows at [facility] for his care needs. Patient is a 68 yo male with uncontrolled HTN, type 2 DM, tobacco and ETOH abuse who presented with SOB. CM team will continue to dispositional needs.  Expected Discharge Plan: Home/Self Care Barriers to Discharge: Continued Medical Work up   Expected Discharge Plan and Services Expected Discharge Plan: Home/Self Care In-house Referral: Clinical Social Work Discharge Planning Services: CM Consult       Admission diagnosis:  Acute systolic congestive heart failure ([place]) [I50.21] CHF (congestive heart failure) ([place]) [I50.9] Patient Active Problem List   Diagnosis Date Noted  . CHF (congestive heart failure) ([place]) 01/04/2019  . Acute on chronic systolic and diastolic heart failure, NYHA class 1 ([place]) 01/04/2019  . Unstable angina pectoris ([place]) 12/25/2018   PCP:  [place]:   [place], [place] - [place] [phone] [place] [address] Phone: [phone] Fax: [phone]     Social Determinants of Health (SDOH) Interventions    Readmission Risk Interventions No flowsheet data found.

## 2019-01-04 NOTE — Plan of Care (Signed)

## 2019-01-04 NOTE — H&P (Signed)
Corey Meyer is an 68 y.o. male.   Chief Complaint: Shortness of breath HPI:   68 y.o. African American male  with uncontrolled hypertension, type 2 DM, tobacco abuse, alcohol abuse, with HFrEF, recent hospital discharge, readmitted with shortness of breath.   He endorses dietary noncompliance, states he "eats everything he wants" and has no insight into heart failure diet restrictions. He admits to drinking 3.5 gallons of bourbon a week since 03/2019. He used to be crack cocaine addict prior to that.   Chest Xray., BNP suggestive of heart failur eexacerbation.  Past Medical History:  Diagnosis Date  . Controlled diabetes mellitus type II without complication ([place])   . Essential hypertension   . Hypercholesteremia   . Hypercholesteremia     Past Surgical History:  Procedure Laterality Date  . LEFT HEART CATH AND CORONARY ANGIOGRAPHY N/A 12/25/2018   Procedure: LEFT HEART CATH AND CORONARY ANGIOGRAPHY;  Surgeon: Adrian Prows, MD;  Location: [facility];  Service: Cardiovascular;  Laterality: N/A;    No family history on file. Social History:  reports that he has been smoking. He has been smoking about 1.00 pack per day. He has never used smokeless tobacco. He reports current alcohol use of about 6.0 standard drinks of alcohol per week. He reports that he does not use drugs.  Allergies: No Known Allergies  Review of Systems  Constitution: Negative for decreased appetite, malaise/fatigue, weight gain and weight loss.  HENT: Negative for congestion.   Eyes: Negative for visual disturbance.  Cardiovascular: Positive for dyspnea on exertion. Negative for chest pain, leg swelling, palpitations and syncope.  Respiratory: Positive for shortness of breath. Negative for cough.   Endocrine: Negative for cold intolerance.  Hematologic/Lymphatic: Does not bruise/bleed easily.  Skin: Negative for itching and rash.  Musculoskeletal: Negative for myalgias.  Gastrointestinal: Negative for  abdominal pain, nausea and vomiting.  Genitourinary: Negative for dysuria.  Neurological: Negative for dizziness and weakness.  Psychiatric/Behavioral: The patient is not nervous/anxious.   All other systems reviewed and are negative.    Blood pressure (!) 149/120, pulse 81, temperature 98.4 F (36.9 C), temperature source Oral, resp. rate 17, height 5\' 9"  (1.753 m), weight 108 kg, SpO2 90 %. Body mass index is 35.16 kg/m.  Physical Exam  Constitutional: He is oriented to person, place, and time. He appears well-developed and well-nourished. No distress.  HENT:  Head: Normocephalic and atraumatic.  Eyes: Pupils are equal, round, and reactive to light. Conjunctivae are normal.  Neck: JVD present.  Cardiovascular: Normal rate, regular rhythm and intact distal pulses.  Pulmonary/Chest: Effort normal. He has no wheezes. He has rales.  Abdominal: Soft. Bowel sounds are normal. There is no rebound.  Musculoskeletal:        General: No edema.  Lymphadenopathy:    He has no cervical adenopathy.  Neurological: He is alert and oriented to person, place, and time. No cranial nerve deficit.  Skin: Skin is warm and dry.  Psychiatric: He has a normal mood and affect.  Nursing note and vitals reviewed.   Results for orders placed or performed during the hospital encounter of 01/04/19 (from the past 48 hour(s))  Basic metabolic panel     Status: Abnormal   Collection Time: 01/04/19  2:33 AM  Result Value Ref Range   Sodium 142 135 - 145 mmol/L   Potassium 4.0 3.5 - 5.1 mmol/L   Chloride 107 98 - 111 mmol/L   CO2 23 22 - 32 mmol/L   Glucose, Bld  181 (H) 70 - 99 mg/dL   BUN 12 8 - 23 mg/dL   Creatinine, Ser 1.611.47 (H) 0.61 - 1.24 mg/dL   Calcium 9.4 8.9 - 09.610.3 mg/dL   GFR calc non Af Amer 49 (L) >60 mL/min   GFR calc Af Amer 57 (L) >60 mL/min   Anion gap 12 5 - 15    Comment: Performed at [facility], [address] [place], [place] [address]  CBC     Status: None   Collection  Time: 01/04/19  2:33 AM  Result Value Ref Range   WBC 7.8 4.0 - 10.5 K/uL   RBC 4.40 4.22 - 5.81 MIL/uL   Hemoglobin 14.0 13.0 - 17.0 g/dL   HCT 98.143.3 19.139.0 - 47.852.0 %   MCV 98.4 80.0 - 100.0 fL   MCH 31.8 26.0 - 34.0 pg   MCHC 32.3 30.0 - 36.0 g/dL   RDW 29.515.4 62.111.5 - 30.815.5 %   Platelets 325 150 - 400 K/uL   nRBC 0.0 0.0 - 0.2 %    Comment: Performed at [facility], [address] [place], [place] [address]  Troponin I (High Sensitivity)     Status: Abnormal   Collection Time: 01/04/19  2:33 AM  Result Value Ref Range   Troponin I (High Sensitivity) 154 (HH) <18 ng/L    Comment: CRITICAL RESULT CALLED TO, READ BACK BY AND VERIFIED WITH: DENNIS A,RN 01/04/19 0322 WAYK Performed at [facility], [address] [place], [place] [address]   Brain natriuretic peptide     Status: Abnormal   Collection Time: 01/04/19  2:33 AM  Result Value Ref Range   B Natriuretic Peptide 670.8 (H) 0.0 - 100.0 pg/mL    Comment: Performed at [facility], [address] [place], [place] [address]  SARS Coronavirus 2 (CEPHEID - Performed in [facility] hospital lab), Hosp Order     Status: None   Collection Time: 01/04/19  3:59 AM   Specimen: Nasopharyngeal Swab  Result Value Ref Range   SARS Coronavirus 2 NEGATIVE NEGATIVE    Comment: (NOTE) If result is NEGATIVE SARS-CoV-2 target nucleic acids are NOT DETECTED. The SARS-CoV-2 RNA is generally detectable in upper and lower  respiratory specimens during the acute phase of infection. The lowest  concentration of SARS-CoV-2 viral copies this assay can detect is 250  copies / mL. A negative result does not preclude SARS-CoV-2 infection  and should not be used as the sole basis for treatment or other  patient management decisions.  A negative result may occur with  improper specimen collection / handling, submission of specimen other  than nasopharyngeal swab, presence of viral mutation(s) within the  areas targeted by this assay, and  inadequate number of viral copies  (<250 copies / mL). A negative result must be combined with clinical  observations, patient history, and epidemiological information. If result is POSITIVE SARS-CoV-2 target nucleic acids are DETECTED. The SARS-CoV-2 RNA is generally detectable in upper and lower  respiratory specimens dur ing the acute phase of infection.  Positive  results are indicative of active infection with SARS-CoV-2.  Clinical  correlation with patient history and other diagnostic information is  necessary to determine patient infection status.  Positive results do  not rule out bacterial infection or co-infection with other viruses. If result is PRESUMPTIVE POSTIVE SARS-CoV-2 nucleic acids MAY BE PRESENT.   A presumptive positive result was obtained on the submitted specimen  and confirmed  on repeat testing.  While 2019 novel coronavirus  (SARS-CoV-2) nucleic acids may be present in the submitted sample  additional confirmatory testing may be necessary for epidemiological  and / or clinical management purposes  to differentiate between  SARS-CoV-2 and other Sarbecovirus currently known to infect humans.  If clinically indicated additional testing with an alternate test  methodology [phone] is advised. The SARS-CoV-2 RNA is generally  detectable in upper and lower respiratory sp ecimens during the acute  phase of infection. The expected result is Negative. Fact Sheet for Patients:  BoilerBrush.com.cyhttps://www.fda.gov/media/136312/download Fact Sheet for Healthcare Providers: https://pope.com/https://www.fda.gov/media/136313/download This test is not yet approved or cleared by the [place] FDA and has been authorized for detection and/or diagnosis of SARS-CoV-2 by FDA under an Emergency Use Authorization (EUA).  This EUA will remain in effect (meaning this test can be used) for the duration of the COVID-19 declaration under Section 564(b)(1) of the Act, 21 U.S.C. section 360bbb-3(b)(1), unless the  authorization is terminated or revoked sooner. Performed at [facility], [address] [place], [place] [address]     Labs:   Lab Results  Component Value Date   WBC 7.8 01/04/2019   HGB 14.0 01/04/2019   HCT 43.3 01/04/2019   MCV 98.4 01/04/2019   PLT 325 01/04/2019    Recent Labs  Lab 01/04/19 0233  NA 142  K 4.0  CL 107  CO2 23  BUN 12  CREATININE 1.47*  CALCIUM 9.4  GLUCOSE 181*    Lipid Panel     Component Value Date/Time   CHOL 146 12/25/2018 1423   TRIG 52 12/25/2018 1423   HDL 73 12/25/2018 1423   CHOLHDL 2.0 12/25/2018 1423   VLDL 10 12/25/2018 1423   LDLCALC 63 12/25/2018 1423    BNP (last 3 results) Recent Labs    12/25/18 1007 01/04/19 0233  BNP 312.2* 670.8*    HEMOGLOBIN A1C Lab Results  Component Value Date   HGBA1C 6.3 (H) 12/25/2018   MPG 134.11 12/25/2018    Cardiac Panel (last 3 results) No results for input(s): CKTOTAL, CKMB, TROPONINI, RELINDX in the last 8760 hours.  No results found for: CKTOTAL, CKMB, CKMBINDEX, TROPONINI   TSH Recent Labs    12/25/18 1904  TSH 0.999     Medications Prior to Admission  Medication Sig Dispense Refill  . amLODipine (NORVASC) 10 MG tablet Take 1 tablet (10 mg total) by mouth every evening. 30 tablet 1  . enalapril (VASOTEC) 20 MG tablet Take 1 tablet (20 mg total) by mouth every evening. 90 tablet 0  . glipiZIDE (GLUCOTROL) 5 MG tablet Take 5 mg by mouth 2 (two) times daily before a meal.    . Lurasidone HCl 60 MG TABS Take 60 mg by mouth daily after supper.    . metFORMIN (GLUCOPHAGE) 1000 MG tablet Take 1,000 mg by mouth 2 (two) times daily with a meal.    . metoprolol succinate (TOPROL XL) 100 MG 24 hr tablet Take 1 tablet (100 mg total) by mouth daily. Take with or immediately following a meal. 30 tablet 1  . pantoprazole (PROTONIX) 40 MG tablet Take 40 mg by mouth 2 (two) times daily before a meal.    . simvastatin (ZOCOR) 20 MG tablet Take 20 mg by mouth at bedtime.     . traZODone (DESYREL) 150 MG tablet Take 150 mg by mouth at bedtime as needed for sleep.    Marland Kitchen. triamterene-hydrochlorothiazide (MAXZIDE-25) 37.5-25 MG tablet Take 1 tablet by  mouth every morning. 30 tablet 1  . vitamin B-12 (CYANOCOBALAMIN) 500 MCG tablet Take 500 mcg by mouth daily.        Current Facility-Administered Medications:  .  0.9 %  sodium chloride infusion, 250 mL, Intravenous, PRN, Yates DecampGanji, Jay, MD .  acetaminophen (TYLENOL) tablet 650 mg, 650 mg, Oral, Q4H PRN, Yates DecampGanji, Jay, MD .  enalapril (VASOTEC) tablet 20 mg, 20 mg, Oral, QPM, Yates DecampGanji, Jay, MD .  furosemide (LASIX) injection 20 mg, 20 mg, Intravenous, BID, Yates DecampGanji, Jay, MD, 20 mg at 01/04/19 0809 .  heparin injection 5,000 Units, 5,000 Units, Subcutaneous, Q8H, Yates DecampGanji, Jay, MD .  metoprolol succinate (TOPROL-XL) 24 hr tablet 100 mg, 100 mg, Oral, Daily, Yates DecampGanji, Jay, MD, 100 mg at 01/04/19 0810 .  ondansetron (ZOFRAN) injection 4 mg, 4 mg, Intravenous, Q6H PRN, Yates DecampGanji, Jay, MD .  simvastatin (ZOCOR) tablet 20 mg, 20 mg, Oral, QHS, Yates DecampGanji, Jay, MD .  sodium chloride flush (NS) 0.9 % injection 3 mL, 3 mL, Intravenous, Once, Ward, Kristen N, DO .  sodium chloride flush (NS) 0.9 % injection 3 mL, 3 mL, Intravenous, Q12H, Yates DecampGanji, Jay, MD, 3 mL at 01/04/19 0811 .  sodium chloride flush (NS) 0.9 % injection 3 mL, 3 mL, Intravenous, PRN, Yates DecampGanji, Jay, MD .  spironolactone (ALDACTONE) tablet 25 mg, 25 mg, Oral, Daily, Yates DecampGanji, Jay, MD, 25 mg at 01/04/19 0810   Today's Vitals   01/04/19 0420 01/04/19 0430 01/04/19 0500 01/04/19 0747  BP:  (!) 131/118 (!) 138/108 (!) 149/120  Pulse:  84 81   Resp:  14 (!) 22 17  Temp:    98.4 F (36.9 C)  TempSrc:    Oral  SpO2:  97% 95% 90%  Weight:      Height:      PainSc: 0-No pain      Body mass index is 35.16 kg/m.       CARDIAC STUDIES:  EKG 01/04/2019: Sinus rhythm. IVCD. Prolonged QTc  Echocardiogram 12/26/2018: Personally reviewed by me. Global hypokinesis with LVEF 20-25%. Grade 1  diastolic dysfunction. Mild MR, mild TR.    Assessment/Plan  68 y.o. African American male  with uncontrolled hypertension, type 2 DM, tobacco abuse, alcohol abuse, with HFrEF, recent hospital discharge, readmitted with shortness of breath.   HFrEF: Alcoholic nonischemic cardiomyopathy. Stop amlodipine. Start spironolactone 25 mg daily. IV lasix 20 mg bid.  [facility]. Continue metoprolol succinate 100 mg daily. Strict I/O. Daily weights.   Alcohol abuse: Social Designer, industrial/productwork/case manager consult. He is interested in rehab. CIWA protocol  Hypertension: As above  Elder NegusManish J Haylo Fake, MD 01/04/2019, 8:13 AM [facility] Pager: [phone] Office: [phone] If no answer: [phone]

## 2019-01-04 NOTE — ED Notes (Addendum)
Per 3E, pt. with High troponin/BNP not appropriate for floor.
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
## 2019-01-04 NOTE — ED Notes (Signed)
Nurse room [phone] not available for report. To call back.

## 2019-01-04 NOTE — Plan of Care (Signed)

## 2019-01-04 NOTE — ED Notes (Signed)
Attempted to call report x2 to 2C03, no answer.

## 2019-01-04 NOTE — ED Notes (Signed)
PA at bedside.

## 2019-01-04 NOTE — ED Notes (Signed)
Patient transported to X-ray 
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
## 2019-01-04 NOTE — ED Triage Notes (Signed)
Pt presents with increased work of breathing onset last night, pt reports he felt fine yesterday, now speaking short sentences, feeling as if he can not get enough air. Denies pain.

## 2019-01-05 DIAGNOSIS — Z20828 Contact with and (suspected) exposure to other viral communicable diseases: Secondary | ICD-10-CM | POA: Diagnosis present

## 2019-01-05 DIAGNOSIS — I426 Alcoholic cardiomyopathy: Secondary | ICD-10-CM

## 2019-01-05 DIAGNOSIS — E785 Hyperlipidemia, unspecified: Secondary | ICD-10-CM | POA: Diagnosis present

## 2019-01-05 DIAGNOSIS — I5043 Acute on chronic combined systolic (congestive) and diastolic (congestive) heart failure: Secondary | ICD-10-CM | POA: Diagnosis present

## 2019-01-05 DIAGNOSIS — I10 Essential (primary) hypertension: Secondary | ICD-10-CM

## 2019-01-05 DIAGNOSIS — Z7984 Long term (current) use of oral hypoglycemic drugs: Secondary | ICD-10-CM | POA: Diagnosis not present

## 2019-01-05 DIAGNOSIS — T502X5A Adverse effect of carbonic-anhydrase inhibitors, benzothiadiazides and other diuretics, initial encounter: Secondary | ICD-10-CM | POA: Diagnosis present

## 2019-01-05 DIAGNOSIS — E669 Obesity, unspecified: Secondary | ICD-10-CM | POA: Diagnosis present

## 2019-01-05 DIAGNOSIS — Z79899 Other long term (current) drug therapy: Secondary | ICD-10-CM | POA: Diagnosis not present

## 2019-01-05 DIAGNOSIS — E7800 Pure hypercholesterolemia, unspecified: Secondary | ICD-10-CM | POA: Diagnosis present

## 2019-01-05 DIAGNOSIS — Y92239 Unspecified place in hospital as the place of occurrence of the external cause: Secondary | ICD-10-CM | POA: Diagnosis present

## 2019-01-05 DIAGNOSIS — F17210 Nicotine dependence, cigarettes, uncomplicated: Secondary | ICD-10-CM | POA: Diagnosis present

## 2019-01-05 DIAGNOSIS — Z6835 Body mass index (BMI) 35.0-35.9, adult: Secondary | ICD-10-CM | POA: Diagnosis not present

## 2019-01-05 DIAGNOSIS — N179 Acute kidney failure, unspecified: Secondary | ICD-10-CM | POA: Diagnosis not present

## 2019-01-05 DIAGNOSIS — E119 Type 2 diabetes mellitus without complications: Secondary | ICD-10-CM | POA: Diagnosis present

## 2019-01-05 DIAGNOSIS — E876 Hypokalemia: Secondary | ICD-10-CM

## 2019-01-05 DIAGNOSIS — I420 Dilated cardiomyopathy: Secondary | ICD-10-CM | POA: Diagnosis present

## 2019-01-05 DIAGNOSIS — Z9111 Patient's noncompliance with dietary regimen: Secondary | ICD-10-CM | POA: Diagnosis not present

## 2019-01-05 DIAGNOSIS — R0902 Hypoxemia: Secondary | ICD-10-CM | POA: Diagnosis present

## 2019-01-05 DIAGNOSIS — I110 Hypertensive heart disease with heart failure: Secondary | ICD-10-CM | POA: Diagnosis present

## 2019-01-05 DIAGNOSIS — I5021 Acute systolic (congestive) heart failure: Secondary | ICD-10-CM | POA: Diagnosis present

## 2019-01-05 DIAGNOSIS — J810 Acute pulmonary edema: Secondary | ICD-10-CM | POA: Diagnosis not present

## 2019-01-05 DIAGNOSIS — F1020 Alcohol dependence, uncomplicated: Secondary | ICD-10-CM | POA: Diagnosis present

## 2019-01-05 LAB — BASIC METABOLIC PANEL
Anion gap: 14 (ref 5–15)
BUN: 15 mg/dL (ref 8–23)
CO2: 25 mmol/L (ref 22–32)
Calcium: 9.6 mg/dL (ref 8.9–10.3)
Chloride: 100 mmol/L (ref 98–111)
Creatinine, Ser: 1.28 mg/dL — ABNORMAL HIGH (ref 0.61–1.24)
GFR calc Af Amer: >60 mL/min (ref >60)
GFR calc non Af Amer: 58 mL/min — ABNORMAL LOW (ref >60)
Glucose, Bld: 135 mg/dL — ABNORMAL HIGH (ref 70–99)
Potassium: 3 mmol/L — ABNORMAL LOW (ref 3.5–5.1)
Sodium: 139 mmol/L (ref 135–145)

## 2019-01-05 LAB — BRAIN NATRIURETIC PEPTIDE: B Natriuretic Peptide: 1147.9 pg/mL — ABNORMAL HIGH (ref 0.0–100.0)

## 2019-01-05 MED ORDER — POTASSIUM CHLORIDE ER 10 MEQ PO TBCR
20.0000 meq | EXTENDED_RELEASE_TABLET | Freq: Three times a day (TID) | ORAL | Status: DC
  Administered 2019-01-05 – 2019-01-06 (×3): 20 meq via ORAL
  Filled 2019-01-05 (×6): qty 2

## 2019-01-05 MED ORDER — DIAZEPAM 5 MG PO TABS
5.0000 mg | ORAL_TABLET | Freq: Three times a day (TID) | ORAL | Status: DC
  Administered 2019-01-05 – 2019-01-06 (×4): 5 mg via ORAL
  Filled 2019-01-05 (×4): qty 1

## 2019-01-05 MED ORDER — POTASSIUM CHLORIDE ER 10 MEQ PO TBCR
20.0000 meq | EXTENDED_RELEASE_TABLET | Freq: Three times a day (TID) | ORAL | Status: DC
  Filled 2019-01-05: qty 2

## 2019-01-05 MED ORDER — POTASSIUM CHLORIDE ER 10 MEQ PO TBCR
20.0000 meq | EXTENDED_RELEASE_TABLET | ORAL | Status: AC
  Administered 2019-01-05 (×3): 20 meq via ORAL
  Filled 2019-01-05 (×6): qty 2

## 2019-01-05 MED ORDER — FUROSEMIDE 10 MG/ML IJ SOLN
40.0000 mg | Freq: Three times a day (TID) | INTRAMUSCULAR | Status: DC
  Administered 2019-01-05 – 2019-01-06 (×3): 40 mg via INTRAVENOUS
  Filled 2019-01-05 (×3): qty 4
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
## 2019-01-06 ENCOUNTER — Telehealth: Payer: Self-pay

## 2019-01-06 DIAGNOSIS — N179 Acute kidney failure, unspecified: Secondary | ICD-10-CM

## 2019-01-06 DIAGNOSIS — J810 Acute pulmonary edema: Secondary | ICD-10-CM

## 2019-01-06 LAB — BASIC METABOLIC PANEL
Anion gap: 10 (ref 5–15)
BUN: 18 mg/dL (ref 8–23)
CO2: 25 mmol/L (ref 22–32)
Calcium: 9.5 mg/dL (ref 8.9–10.3)
Chloride: 101 mmol/L (ref 98–111)
Creatinine, Ser: 1.54 mg/dL — ABNORMAL HIGH (ref 0.61–1.24)
GFR calc Af Amer: 54 mL/min — ABNORMAL LOW (ref >60)
GFR calc non Af Amer: 47 mL/min — ABNORMAL LOW (ref >60)
Glucose, Bld: 162 mg/dL — ABNORMAL HIGH (ref 70–99)
Potassium: 3.7 mmol/L (ref 3.5–5.1)
Sodium: 136 mmol/L (ref 135–145)

## 2019-01-06 LAB — BRAIN NATRIURETIC PEPTIDE: B Natriuretic Peptide: 292.7 pg/mL — ABNORMAL HIGH (ref 0.0–100.0)

## 2019-01-06 MED ORDER — POTASSIUM CHLORIDE ER 20 MEQ PO TBCR: 20.0000 meq | EXTENDED_RELEASE_TABLET | Freq: Every day | ORAL | 1 refills | Status: DC

## 2019-01-06 MED ORDER — SPIRONOLACTONE 25 MG PO TABS: 25.0000 mg | ORAL_TABLET | Freq: Every morning | ORAL | 1 refills | Status: DC

## 2019-01-06 MED ORDER — SPIRONOLACTONE 25 MG PO TABS: 25.0000 mg | ORAL_TABLET | Freq: Every morning | ORAL | 0 refills | Status: DC

## 2019-01-06 MED ORDER — SPIRONOLACTONE 25 MG PO TABS: 25.0000 mg | ORAL_TABLET | ORAL | 2 refills | Status: DC

## 2019-01-06 MED ORDER — FUROSEMIDE 40 MG PO TABS: 40.0000 mg | ORAL_TABLET | ORAL | 1 refills | Status: DC

## 2019-01-06 MED ORDER — FUROSEMIDE 40 MG PO TABS: 40.0000 mg | ORAL_TABLET | ORAL | 0 refills | Status: DC

## 2019-01-06 MED ORDER — POTASSIUM CHLORIDE ER 20 MEQ PO TBCR: 20.0000 meq | EXTENDED_RELEASE_TABLET | Freq: Every morning | ORAL | 0 refills | Status: DC

## 2019-01-06 MED FILL — POTASSIUM CL ER 20 MEQ TABL: 20 | 30 days supply | Qty: 30 | Fill #0

## 2019-01-06 MED FILL — SPIRONOLACTONE 25 MG TABLET: 25 | 30 days supply | Qty: 30 | Fill #0

## 2019-01-06 MED FILL — FUROSEMIDE 40 MG TABLET: 40 | 30 days supply | Qty: 30 | Fill #0
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
## 2019-01-06 NOTE — Plan of Care (Signed)

## 2019-01-06 NOTE — Progress Notes (Signed)
Patient provided with detailed verbal discharge instructions. Paper copy provided to patient. No further questions from patient. Prescription brought to patient at discharge by [facility]. Patient belongings sent with patient. Patient discharged via wheelchair through [employer] entrance to private vehicle.

## 2019-01-06 NOTE — Telephone Encounter (Signed)
Location of hospitalization: [place] Reason for hospitalization: chest pain, SOB Date of discharge:01/06/19 Date of first communication with patient: 01/06/19 Person contacting patient: Corey Meyer b Current symptoms: feel okay Do you understand why you were in the Hospital: Yes Questions regarding discharge instructions: None Where were you discharged to: Home Medications reviewed: Yes Allergies reviewed: Yes Dietary changes reviewed: Yes. Heart healthy diet Referals reviewed: NA Activities of Daily Living:  No straining  Any transportation issues/concerns: None Any patient concerns: None Confirmed importance & date/time of Follow up appt: Yes 7/15//20 @ 1:00p.m. Confirmed with patient if condition begins to worsen call. Pt was given the office number and encouraged to call back with questions or concerns: Yes

## 2019-01-06 NOTE — Telephone Encounter (Signed)
-----   Message from Chad Cordial sent at 01/06/2019  9:39 AM EDT ----- Regarding: TOC Per JG pt needs TOC. He has a f/u appt on 7/15 @ 1:00.

## 2019-01-13 ENCOUNTER — Encounter: Payer: Self-pay | Admitting: Cardiology

## 2019-01-13 ENCOUNTER — Other Ambulatory Visit: Payer: Self-pay

## 2019-01-13 ENCOUNTER — Ambulatory Visit (INDEPENDENT_AMBULATORY_CARE_PROVIDER_SITE_OTHER): Payer: No Typology Code available for payment source | Admitting: Cardiology

## 2019-01-13 DIAGNOSIS — I130 Hypertensive heart and chronic kidney disease with heart failure and stage 1 through stage 4 chronic kidney disease, or unspecified chronic kidney disease: Secondary | ICD-10-CM

## 2019-01-13 DIAGNOSIS — I5022 Chronic systolic (congestive) heart failure: Secondary | ICD-10-CM | POA: Diagnosis not present

## 2019-01-13 DIAGNOSIS — Z720 Tobacco use: Secondary | ICD-10-CM

## 2019-01-13 DIAGNOSIS — I426 Alcoholic cardiomyopathy: Secondary | ICD-10-CM | POA: Diagnosis not present

## 2019-01-13 DIAGNOSIS — Z789 Other specified health status: Secondary | ICD-10-CM

## 2019-01-13 DIAGNOSIS — N183 Chronic kidney disease, stage 3 unspecified: Secondary | ICD-10-CM

## 2019-01-13 DIAGNOSIS — Z7289 Other problems related to lifestyle: Secondary | ICD-10-CM | POA: Diagnosis not present

## 2019-01-13 DIAGNOSIS — I10 Essential (primary) hypertension: Secondary | ICD-10-CM

## 2019-01-13 NOTE — Progress Notes (Signed)
Primary Physician:  [facility]   Patient ID: Corey AlbrightWalter Meyer, male    DOB: 12/24/1950, 68 y.o.   MRN: [id number]  Subjective:    Chief Complaint  Patient presents with  . Congestive Heart Failure  . Hospitalization Follow-up    HPI: Corey Meyer  is a 68 y.o. male  with hypertension, hypercholesteremia, type 2 diabetes, non-ischemic cardiomyopathy by coronary angiogram in June 2020 felt to be related to heavy alcohol use, previous cocaine use, ongoing tobacco use, recently presented to the hospital on 01/04/2019 with hypoxemia and pulmonary edema requiring aggressive diuresis secondary to acute on chronic systolic and diastolic heart failure exacerbation.  He now presents for hospital follow up. Patient had previously been in the hospital in early June with chest pain and elevated troponin. Had normal coronary arteries by cath, but non-ischemic cardiomyopathy. He was started on heart failure therapy at discharge; however, patient did not start the medications, which led to his most recent hospitalization. He did have AKI not felt to be significant.   He states that since discharge, he is doing much better. Has not had any significant shortness of breath. No leg swelling, chest pain, PND or orthopnea. He is tolerating medications well.   Patient previously used cocaine, but has been abstinent since early 2020. He was previously drinking 3.5 gallons of bourbon a week, but since discharge has been able to cut back to 2 fifths a week. He states that his heavy drinking started in Sept 2019 after moving here. States that with quitting drug use, he replaced with alcohol use. He is considering a 3 month rehab program. Continues to smoke 1 pack per day for the last 30 years.   He is followed by the [facility], which he sees approximately twice a year. States diabetes is well controlled.   Past Medical History:  Diagnosis Date  . Controlled diabetes mellitus type II without complication (HCC)   .  Essential hypertension   . Hypercholesteremia   . Hypercholesteremia     Past Surgical History:  Procedure Laterality Date  . LEFT HEART CATH AND CORONARY ANGIOGRAPHY N/A 12/25/2018   Procedure: LEFT HEART CATH AND CORONARY ANGIOGRAPHY;  Surgeon: Yates DecampGanji, Jay, MD;  Location: [facility];  Service: Cardiovascular;  Laterality: N/A;    Social History   Socioeconomic History  . Marital status: Single    Spouse name: Not on file  . Number of children: 3  . Years of education: Not on file  . Highest education level: Not on file  Occupational History  . Not on file  Social Needs  . Financial resource strain: Not on file  . Food insecurity    Worry: Not on file    Inability: Not on file  . Transportation needs    Medical: Not on file    Non-medical: Not on file  Tobacco Use  . Smoking status: Current Every Day Smoker    Packs/day: 1.00    Years: 30.00    Pack years: 30.00    Types: Cigarettes  . Smokeless tobacco: Never Used  Substance and Sexual Activity  . Alcohol use: Yes    Alcohol/week: 6.0 standard drinks    Types: 6 Shots of liquor per week    Comment: 3.5 gallons of bourbon/week since 03/2019  . Drug use: Yes    Types: Cocaine    Comment: Quit cocaine in early 2020  . Sexual activity: Not Currently  Lifestyle  . Physical activity    Days per  week: 0 days    Minutes per session: Not on file  . Stress: Not at all  Relationships  . Social Herbalist on phone: Not on file    Gets together: Not on file    Attends religious service: Not on file    Active member of club or organization: Not on file    Attends meetings of clubs or organizations: Not on file    Relationship status: Not on file  . Intimate partner violence    Fear of current or ex partner: Not on file    Emotionally abused: Not on file    Physically abused: Not on file    Forced sexual activity: Not on file  Other Topics Concern  . Not on file  Social History Narrative  . Not on  file    Review of Systems  Constitution: Negative for decreased appetite, malaise/fatigue, weight gain and weight loss.  Eyes: Negative for visual disturbance.  Cardiovascular: Positive for dyspnea on exertion (significantly improved). Negative for chest pain, claudication, leg swelling, orthopnea, palpitations and syncope.  Respiratory: Negative for hemoptysis and wheezing.   Endocrine: Negative for cold intolerance and heat intolerance.  Hematologic/Lymphatic: Does not bruise/bleed easily.  Skin: Negative for nail changes.  Musculoskeletal: Negative for muscle weakness and myalgias.  Gastrointestinal: Negative for abdominal pain, change in bowel habit, nausea and vomiting.  Neurological: Negative for difficulty with concentration, dizziness, focal weakness and headaches.  Psychiatric/Behavioral: Negative for altered mental status and suicidal ideas.  All other systems reviewed and are negative.     Objective:  Blood pressure 101/71, pulse 82, height 5\' 9"  (1.753 m), weight 215 lb 3.2 oz (97.6 kg), SpO2 97 %. Body mass index is 31.78 kg/m.    Physical Exam  Constitutional: He is oriented to person, place, and time. Vital signs are normal. He appears well-developed and well-nourished.  HENT:  Head: Normocephalic and atraumatic.  Neck: Normal range of motion.  Cardiovascular: Normal rate, regular rhythm, normal heart sounds and intact distal pulses.  Pulmonary/Chest: Effort normal and breath sounds normal. No accessory muscle usage. No respiratory distress.  Abdominal: Soft. Bowel sounds are normal.  Musculoskeletal: Normal range of motion.  Neurological: He is alert and oriented to person, place, and time.  Skin: Skin is warm and dry.  Vitals reviewed.  Radiology:  CTA of chest 12/25/2018:  1. No demonstrable pulmonary embolus. No thoracic aortic aneurysm. No dissection evident with caveat that the contrast bolus in the aorta is not sufficient to exclude dissection is a  differential consideration radiographically. Foci coronary artery calcification are noted.  2. Small free-flowing pleural effusions. Areas of atelectatic change. No edema or consolidation.  3.  Right hilar and subcarinal adenopathy of uncertain etiology.  4. Reflux of contrast into the inferior vena cava and hepatic veins is noted, a finding that may be indicative of a degree of increase in right heart pressure.  Laboratory examination:    CMP Latest Ref Rng & Units 01/06/2019 01/05/2019 01/04/2019  Glucose 70 - 99 mg/dL 162(H) 135(H) 181(H)  BUN 8 - 23 mg/dL 18 15 12   Creatinine 0.61 - 1.24 mg/dL 1.54(H) 1.28(H) 1.47(H)  Sodium 135 - 145 mmol/L 136 139 142  Potassium 3.5 - 5.1 mmol/L 3.7 3.0(L) 4.0  Chloride 98 - 111 mmol/L 101 100 107  CO2 22 - 32 mmol/L 25 25 23   Calcium 8.9 - 10.3 mg/dL 9.5 9.6 9.4  Total Protein 6.5 - 8.1 g/dL - - -  Total  Bilirubin 0.3 - 1.2 mg/dL - - -  Alkaline Phos 38 - 126 U/L - - -  AST 15 - 41 U/L - - -  ALT 0 - 44 U/L - - -   CBC Latest Ref Rng & Units 01/04/2019 12/26/2018 12/25/2018  WBC 4.0 - 10.5 K/uL 7.8 8.3 6.9  Hemoglobin 13.0 - 17.0 g/dL 16.114.0 12.2(L) 12.8(L)  Hematocrit 39.0 - 52.0 % 43.3 36.0(L) 39.8  Platelets 150 - 400 K/uL 325 265 274   Lipid Panel     Component Value Date/Time   CHOL 146 12/25/2018 1423   TRIG 52 12/25/2018 1423   HDL 73 12/25/2018 1423   CHOLHDL 2.0 12/25/2018 1423   VLDL 10 12/25/2018 1423   LDLCALC 63 12/25/2018 1423   HEMOGLOBIN A1C Lab Results  Component Value Date   HGBA1C 6.3 (H) 12/25/2018   MPG 134.11 12/25/2018   TSH Recent Labs    12/25/18 1904  TSH 0.999    PRN Meds:. Medications Discontinued During This Encounter  Medication Reason  . potassium chloride 20 MEQ TBCR Duplicate  . furosemide (LASIX) 40 MG tablet Duplicate  . spironolactone (ALDACTONE) 25 MG tablet Duplicate   Current Meds  Medication Sig  . amLODipine (NORVASC) 10 MG tablet Take 1 tablet (10 mg total) by mouth every  evening.  . cetirizine (ZYRTEC) 10 MG tablet Take 10 mg by mouth daily.  . diclofenac sodium (VOLTAREN) 1 % GEL Apply topically as needed.   . enalapril (VASOTEC) 20 MG tablet Take 1 tablet (20 mg total) by mouth every evening.  . furosemide (LASIX) 40 MG tablet Take 1 tablet (40 mg total) by mouth every morning.  Marland Kitchen. glipiZIDE (GLUCOTROL) 5 MG tablet Take 5 mg by mouth 2 (two) times daily before a meal.  . Lurasidone HCl 60 MG TABS Take 60 mg by mouth daily after supper.  . metFORMIN (GLUCOPHAGE) 1000 MG tablet Take 1,000 mg by mouth 2 (two) times daily with a meal.  . metoprolol succinate (TOPROL XL) 100 MG 24 hr tablet Take 1 tablet (100 mg total) by mouth daily. Take with or immediately following a meal.  . pantoprazole (PROTONIX) 40 MG tablet Take 40 mg by mouth 2 (two) times daily before a meal.  . potassium chloride 20 MEQ TBCR Take 20 mEq by mouth daily.  . prazosin (MINIPRESS) 2 MG capsule Take 2 mg by mouth at bedtime.  . sildenafil (VIAGRA) 100 MG tablet Take 100 mg by mouth daily as needed for erectile dysfunction.  . simvastatin (ZOCOR) 20 MG tablet Take 40 mg by mouth at bedtime.  Marland Kitchen. spironolactone (ALDACTONE) 25 MG tablet Take 1 tablet (25 mg total) by mouth every morning.  . traZODone (DESYREL) 150 MG tablet Take 150 mg by mouth at bedtime as needed for sleep.  Marland Kitchen. triamterene-hydrochlorothiazide (MAXZIDE-25) 37.5-25 MG tablet Take 1 tablet by mouth daily.  . vitamin B-12 (CYANOCOBALAMIN) 500 MCG tablet Take 500 mcg by mouth daily.    Cardiac Studies:   Coronary angiogram 12/25/2018: Coronary arteries.  Mild to moderately dilated LV, markedly elevated LVEDP, EF 40%.  Echocardiogram 12/26/2018:  1. The left ventricle has moderate to severely reduced systolic function, with an ejection fraction of 30-35%. The cavity size was mildly dilated. Left ventricular diastolic parameters were normal. Left ventrical global hypokinesis without regional wall  motion abnormalities. 2. The right  ventricle has normal systolic function. The cavity was normal. There is no increase in right ventricular wall thickness.  Assessment:  ICD-10-CM   1. Alcoholic cardiomyopathy (HCC)  I42.6   2. Chronic systolic (congestive) heart failure (HCC)  I50.22   3. Essential hypertension  I10   4. Alcohol use  Z72.89   5. Tobacco use  Z72.0   6. CKD (chronic kidney disease) stage 3, GFR 30-59 ml/min (HCC)  N18.3 Basic metabolic panel    Basic metabolic panel     EKG 01/04/2019:  NSR right atrial enlargement, leftward axis, IRBBB, poor R wave progression, cannot exclude anteroseptal infarct old. Nonspecific lateral T abnormality.  Recommendations:   Since being discharged from the hospital, patient has had significant improvement in symptoms.  He has minimal dyspnea on exertion which has significantly improved since his hospitalization.  He is tolerating medications well.  Had normal coronary angiogram in June 2020 and cardiomyopathy felt to be related to heavy alcohol use.  I have discussed the importance of complete cessation from alcohol and risk of exacerbations with heart failure.  Patient is working to quit drinking and has been able to cut back from 3-1/2 gallons a week to 2/5 a week.  I have encouraged him to seek help with rehab to help with cessation.  He will discuss this with his PCP and consider this.  No evidence of acute decompensated heart failure by physical exam today.  Blood pressure is stable.  He did have acute kidney injury during his hospitalization due to aggressive diuresis.  I will repeat BMP in the next few days to follow-up on his kidney function.  Depending upon his kidney function, may [facility].  Could also consider BiDil.  Otherwise, he is on appropriate medical therapy.  He does continue to smoke 1 pack/day, I have encouraged him to also be working on smoking cessation to prevent future events and decrease his risk factors.  I would like  to see him back in 1 month for follow-up on CHF and his alcohol use.  Encouraged him to contact me sooner if needed.  Toniann FailAshton Haynes Kelley, MSN, APRN, FNP-C [facility] [place] Office: [phone] Fax: [phone]

## 2019-01-29 LAB — BASIC METABOLIC PANEL
BUN/Creatinine Ratio: 14 (ref 10–24)
BUN: 29 mg/dL — ABNORMAL HIGH (ref 8–27)
CO2: 19 mmol/L — ABNORMAL LOW (ref 20–29)
Calcium: 10.2 mg/dL (ref 8.6–10.2)
Chloride: 100 mmol/L (ref 96–106)
Creatinine, Ser: 2.03 mg/dL — ABNORMAL HIGH (ref 0.76–1.27)
GFR calc Af Amer: 39 mL/min/{1.73_m2} — ABNORMAL LOW (ref >59)
GFR calc non Af Amer: 33 mL/min/{1.73_m2} — ABNORMAL LOW (ref >59)
Glucose: 117 mg/dL — ABNORMAL HIGH (ref 65–99)
Potassium: 5.2 mmol/L (ref 3.5–5.2)
Sodium: 137 mmol/L (ref 134–144)

## 2019-02-02 NOTE — Progress Notes (Signed)
Called unable to leave vm due to mailbox being full

## 2019-02-02 NOTE — Addendum Note (Signed)
Addended by: Gwinda Maine on: 02/02/2019 07:49 AM   Modules accepted: Orders

## 2019-02-04 ENCOUNTER — Telehealth: Payer: Self-pay

## 2019-02-04 NOTE — Telephone Encounter (Signed)
I spoke with the patient and I told him to stop Maxide and to cut Aldactone in half and have blood work checked in 1 week

## 2019-02-09 LAB — BASIC METABOLIC PANEL
BUN/Creatinine Ratio: 16 (ref 10–24)
BUN: 36 mg/dL — ABNORMAL HIGH (ref 8–27)
CO2: 22 mmol/L (ref 20–29)
Calcium: 10.2 mg/dL (ref 8.6–10.2)
Chloride: 99 mmol/L (ref 96–106)
Creatinine, Ser: 2.31 mg/dL — ABNORMAL HIGH (ref 0.76–1.27)
GFR calc Af Amer: 33 mL/min/{1.73_m2} — ABNORMAL LOW (ref >59)
GFR calc non Af Amer: 29 mL/min/{1.73_m2} — ABNORMAL LOW (ref >59)
Glucose: 161 mg/dL — ABNORMAL HIGH (ref 65–99)
Potassium: 5.4 mmol/L — ABNORMAL HIGH (ref 3.5–5.2)
Sodium: 138 mmol/L (ref 134–144)

## 2019-02-10 ENCOUNTER — Encounter: Payer: Self-pay | Admitting: Cardiology

## 2019-02-10 ENCOUNTER — Ambulatory Visit (INDEPENDENT_AMBULATORY_CARE_PROVIDER_SITE_OTHER): Payer: No Typology Code available for payment source | Admitting: Cardiology

## 2019-02-10 VITALS — BP 89/63 | HR 76 | Temp 97.1°F | Ht 69.0 in | Wt 211.0 lb

## 2019-02-10 DIAGNOSIS — N183 Chronic kidney disease, stage 3 unspecified: Secondary | ICD-10-CM

## 2019-02-10 DIAGNOSIS — I129 Hypertensive chronic kidney disease with stage 1 through stage 4 chronic kidney disease, or unspecified chronic kidney disease: Secondary | ICD-10-CM

## 2019-02-10 DIAGNOSIS — I426 Alcoholic cardiomyopathy: Secondary | ICD-10-CM

## 2019-02-10 DIAGNOSIS — I10 Essential (primary) hypertension: Secondary | ICD-10-CM

## 2019-02-10 DIAGNOSIS — I5022 Chronic systolic (congestive) heart failure: Secondary | ICD-10-CM

## 2019-02-10 MED ORDER — BIDIL 20-37.5 MG PO TABS: 1.0000 | ORAL_TABLET | Freq: Three times a day (TID) | ORAL | 0 refills | Status: DC

## 2019-02-10 MED ORDER — FUROSEMIDE 20 MG PO TABS: 20.0000 mg | ORAL_TABLET | Freq: Every day | ORAL | 2 refills | Status: DC

## 2019-02-10 NOTE — Progress Notes (Signed)
Primary Physician:  Burton ApleyPantea, Liliana, MD   Patient ID: Corey AlbrightWalter Wenig, male    DOB: 01/29/1951, 68 y.o.   MRN: [id number]  Subjective:    Chief Complaint  Patient presents with  . Congestive Heart Failure  . Cardiomyopathy  . Follow-up    4wk    HPI: Corey AlbrightWalter Meyer  is a 68 y.o. male  with hypertension, hypercholesteremia, type 2 diabetes, non-ischemic cardiomyopathy by coronary angiogram in June 2020 felt to be related to heavy alcohol use, previous cocaine use, ongoing tobacco use, recently presented to the hospital on 01/04/2019 with hypoxemia and pulmonary edema requiring aggressive diuresis secondary to acute on chronic systolic and diastolic heart failure exacerbation.  He now presents for 4 week follow up. Patient had previously been in the hospital in early June with chest pain and elevated troponin. Had normal coronary arteries by cath, but non-ischemic cardiomyopathy. He was started on heart failure therapy at discharge; however, patient did not start the medications, which led to his most recent hospitalization.  He has recently had worsening kidney function. Maxzide was discontinued. Continues to state that he is doing well without any significant symptoms. Has not had any significant shortness of breath. No leg swelling, chest pain, PND or orthopnea. He is tolerating medications well.   Patient previously used cocaine, but has been abstinent since early 2020. He was previously drinking 3.5 gallons of bourbon a week, but since discharge has been able to cut back to 2 fifths a week. He states that his heavy drinking started in Sept 2019 after moving here. States that with quitting drug use, he replaced with alcohol use. He is considering a 3 month rehab program. Continues to smoke 1 pack per day for the last 30 years.   He is followed by the [facility], which he sees approximately twice a year. States diabetes is well controlled.   Past Medical History:  Diagnosis Date  . Controlled  diabetes mellitus type II without complication (HCC)   . Essential hypertension   . Hypercholesteremia   . Hypercholesteremia     Past Surgical History:  Procedure Laterality Date  . LEFT HEART CATH AND CORONARY ANGIOGRAPHY N/A 12/25/2018   Procedure: LEFT HEART CATH AND CORONARY ANGIOGRAPHY;  Surgeon: Yates DecampGanji, Jay, MD;  Location: [facility];  Service: Cardiovascular;  Laterality: N/A;    Social History   Socioeconomic History  . Marital status: Single    Spouse name: Not on file  . Number of children: 3  . Years of education: Not on file  . Highest education level: Not on file  Occupational History  . Not on file  Social Needs  . Financial resource strain: Not on file  . Food insecurity    Worry: Not on file    Inability: Not on file  . Transportation needs    Medical: Not on file    Non-medical: Not on file  Tobacco Use  . Smoking status: Current Every Day Smoker    Packs/day: 1.00    Years: 30.00    Pack years: 30.00    Types: Cigarettes  . Smokeless tobacco: Never Used  Substance and Sexual Activity  . Alcohol use: Yes    Alcohol/week: 6.0 standard drinks    Types: 6 Shots of liquor per week    Comment: 3.5 gallons of bourbon/week since 03/2019  . Drug use: Yes    Types: Cocaine    Comment: Quit cocaine in early 2020  . Sexual activity: Not Currently  Lifestyle  .  Physical activity    Days per week: 0 days    Minutes per session: Not on file  . Stress: Not at all  Relationships  . Social Herbalist on phone: Not on file    Gets together: Not on file    Attends religious service: Not on file    Active member of club or organization: Not on file    Attends meetings of clubs or organizations: Not on file    Relationship status: Not on file  . Intimate partner violence    Fear of current or ex partner: Not on file    Emotionally abused: Not on file    Physically abused: Not on file    Forced sexual activity: Not on file  Other Topics  Concern  . Not on file  Social History Narrative  . Not on file    Review of Systems  Constitution: Negative for decreased appetite, malaise/fatigue, weight gain and weight loss.  Eyes: Negative for visual disturbance.  Cardiovascular: Negative for chest pain, claudication, dyspnea on exertion (significantly improved), leg swelling, orthopnea, palpitations and syncope.  Respiratory: Negative for hemoptysis and wheezing.   Endocrine: Negative for cold intolerance and heat intolerance.  Hematologic/Lymphatic: Does not bruise/bleed easily.  Skin: Negative for nail changes.  Musculoskeletal: Negative for muscle weakness and myalgias.  Gastrointestinal: Negative for abdominal pain, change in bowel habit, nausea and vomiting.  Neurological: Negative for difficulty with concentration, dizziness, focal weakness and headaches.  Psychiatric/Behavioral: Negative for altered mental status and suicidal ideas.  All other systems reviewed and are negative.     Objective:  Blood pressure (!) 89/63, pulse 76, temperature (!) 97.1 F (36.2 C), height 5\' 9"  (1.753 m), weight 211 lb (95.7 kg), SpO2 96 %. Body mass index is 31.16 kg/m.    Physical Exam  Constitutional: He is oriented to person, place, and time. Vital signs are normal. He appears well-developed and well-nourished.  HENT:  Head: Normocephalic and atraumatic.  Neck: Normal range of motion.  Cardiovascular: Normal rate, regular rhythm, normal heart sounds and intact distal pulses.  Pulmonary/Chest: Effort normal and breath sounds normal. No accessory muscle usage. No respiratory distress.  Abdominal: Soft. Bowel sounds are normal.  Musculoskeletal: Normal range of motion.  Neurological: He is alert and oriented to person, place, and time.  Skin: Skin is warm and dry.  Vitals reviewed.  Radiology:  CTA of chest 12/25/2018:  1. No demonstrable pulmonary embolus. No thoracic aortic aneurysm. No dissection evident with caveat that the  contrast bolus in the aorta is not sufficient to exclude dissection is a differential consideration radiographically. Foci coronary artery calcification are noted.  2. Small free-flowing pleural effusions. Areas of atelectatic change. No edema or consolidation.  3.  Right hilar and subcarinal adenopathy of uncertain etiology.  4. Reflux of contrast into the inferior vena cava and hepatic veins is noted, a finding that may be indicative of a degree of increase in right heart pressure.  Laboratory examination:    CMP Latest Ref Rng & Units 02/08/2019 01/28/2019 01/06/2019  Glucose 65 - 99 mg/dL 161(H) 117(H) 162(H)  BUN 8 - 27 mg/dL 36(H) 29(H) 18  Creatinine 0.76 - 1.27 mg/dL 2.31(H) 2.03(H) 1.54(H)  Sodium 134 - 144 mmol/L 138 137 136  Potassium 3.5 - 5.2 mmol/L 5.4(H) 5.2 3.7  Chloride 96 - 106 mmol/L 99 100 101  CO2 20 - 29 mmol/L 22 19(L) 25  Calcium 8.6 - 10.2 mg/dL 10.2 10.2 9.5  Total  Protein 6.5 - 8.1 g/dL - - -  Total Bilirubin 0.3 - 1.2 mg/dL - - -  Alkaline Phos 38 - 126 U/L - - -  AST 15 - 41 U/L - - -  ALT 0 - 44 U/L - - -   CBC Latest Ref Rng & Units 01/04/2019 12/26/2018 12/25/2018  WBC 4.0 - 10.5 K/uL 7.8 8.3 6.9  Hemoglobin 13.0 - 17.0 g/dL 13.014.0 12.2(L) 12.8(L)  Hematocrit 39.0 - 52.0 % 43.3 36.0(L) 39.8  Platelets 150 - 400 K/uL 325 265 274   Lipid Panel     Component Value Date/Time   CHOL 146 12/25/2018 1423   TRIG 52 12/25/2018 1423   HDL 73 12/25/2018 1423   CHOLHDL 2.0 12/25/2018 1423   VLDL 10 12/25/2018 1423   LDLCALC 63 12/25/2018 1423   HEMOGLOBIN A1C Lab Results  Component Value Date   HGBA1C 6.3 (H) 12/25/2018   MPG 134.11 12/25/2018   TSH Recent Labs    12/25/18 1904  TSH 0.999    PRN Meds:. Medications Discontinued During This Encounter  Medication Reason  . Lurasidone HCl 60 MG TABS Discontinued by provider   Current Meds  Medication Sig  . amLODipine (NORVASC) 10 MG tablet Take 1 tablet (10 mg total) by mouth every  evening.  . cetirizine (ZYRTEC) 10 MG tablet Take 10 mg by mouth daily.  . diclofenac sodium (VOLTAREN) 1 % GEL Apply topically as needed.   . enalapril (VASOTEC) 20 MG tablet Take 1 tablet (20 mg total) by mouth every evening.  . furosemide (LASIX) 40 MG tablet Take 1 tablet (40 mg total) by mouth every morning.  Marland Kitchen. glipiZIDE (GLUCOTROL) 5 MG tablet Take 5 mg by mouth 2 (two) times daily before a meal.  . metFORMIN (GLUCOPHAGE) 1000 MG tablet Take 1,000 mg by mouth 2 (two) times daily with a meal.  . metoprolol succinate (TOPROL XL) 100 MG 24 hr tablet Take 1 tablet (100 mg total) by mouth daily. Take with or immediately following a meal.  . pantoprazole (PROTONIX) 40 MG tablet Take 40 mg by mouth 2 (two) times daily before a meal.  . potassium chloride 20 MEQ TBCR Take 20 mEq by mouth daily.  . prazosin (MINIPRESS) 2 MG capsule Take 2 mg by mouth as needed.   . sildenafil (VIAGRA) 100 MG tablet Take 100 mg by mouth daily as needed for erectile dysfunction.  . simvastatin (ZOCOR) 20 MG tablet Take 40 mg by mouth at bedtime.  Marland Kitchen. spironolactone (ALDACTONE) 25 MG tablet Take 1 tablet (25 mg total) by mouth every morning. (Patient taking differently: Take 12.5 mg by mouth every morning. )  . traZODone (DESYREL) 150 MG tablet Take 150 mg by mouth at bedtime as needed for sleep.  . vitamin B-12 (CYANOCOBALAMIN) 500 MCG tablet Take 500 mcg by mouth daily.    Cardiac Studies:   Coronary angiogram 12/25/2018: Coronary arteries.  Mild to moderately dilated LV, markedly elevated LVEDP, EF 40%.  Echocardiogram 12/26/2018:  1. The left ventricle has moderate to severely reduced systolic function, with an ejection fraction of 30-35%. The cavity size was mildly dilated. Left ventricular diastolic parameters were normal. Left ventrical global hypokinesis without regional wall  motion abnormalities. 2. The right ventricle has normal systolic function. The cavity was normal. There is no increase in right  ventricular wall thickness.  Assessment:     ICD-10-CM   1. Chronic systolic (congestive) heart failure (HCC)  I50.22   2. Alcoholic cardiomyopathy (HCC)  I42.6  3. Essential hypertension  I10   4. CKD (chronic kidney disease) stage 3, GFR 30-59 ml/min (HCC)  N18.3      EKG 01/04/2019:  NSR right atrial enlargement, leftward axis, IRBBB, poor R wave progression, cannot exclude anteroseptal infarct old. Nonspecific lateral T abnormality.  Recommendations:   CKD has continued to worsen despite changes to his medications, will discontinue his enalapril and Aldactone.  Can potentially retry these medications once kidney function improves.  I will also decrease his dose of Lasix down to 20 mg, but advised him that he may take an extra dose if needed.  He denies any shortness of breath or leg swelling.  Essentially asymptomatic.  No clinical evidence of decompensated heart failure.  I will start him on BiDil 1 tablet 3 times a day.  I have discontinued his Viagra and advised him not to use due to contraindication with BiDil.  No other complaints today. No clinical evidence of decompensated heart failure. We will perform BMP to follow-up on kidney function in 2 weeks. I have urged him to continue with working towards complete abstinence from tobacco and alcohol. I will see him back in 4 weeks.  Toniann FailAshton Haynes , MSN, APRN, FNP-C [facility] [place] Office: [phone] Fax: [phone]

## 2019-03-10 ENCOUNTER — Ambulatory Visit: Payer: No Typology Code available for payment source | Admitting: Cardiology

## 2019-04-23 ENCOUNTER — Telehealth: Payer: Self-pay
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
## 2021-12-17 ENCOUNTER — Emergency Department (HOSPITAL_COMMUNITY)
Admission: EM | Admit: 2021-12-17 | Discharge: 2021-12-17 | Disposition: A | Discharge status: Home / Self Care | Payer: No Typology Code available for payment source | Attending: Emergency Medicine | Admitting: Emergency Medicine

## 2021-12-17 ENCOUNTER — Other Ambulatory Visit: Payer: Self-pay

## 2021-12-17 ENCOUNTER — Encounter (HOSPITAL_COMMUNITY): Payer: Self-pay

## 2021-12-17 ENCOUNTER — Emergency Department (HOSPITAL_BASED_OUTPATIENT_CLINIC_OR_DEPARTMENT_OTHER): Payer: No Typology Code available for payment source

## 2021-12-17 DIAGNOSIS — F10129 Alcohol abuse with intoxication, unspecified: Secondary | ICD-10-CM | POA: Insufficient documentation

## 2021-12-17 DIAGNOSIS — N3001 Acute cystitis with hematuria: Secondary | ICD-10-CM

## 2021-12-17 DIAGNOSIS — R350 Frequency of micturition: Secondary | ICD-10-CM | POA: Insufficient documentation

## 2021-12-17 DIAGNOSIS — Z79899 Other long term (current) drug therapy: Secondary | ICD-10-CM | POA: Insufficient documentation

## 2021-12-17 DIAGNOSIS — T83011A Breakdown (mechanical) of indwelling urethral catheter, initial encounter: Secondary | ICD-10-CM

## 2021-12-17 DIAGNOSIS — R609 Edema, unspecified: Secondary | ICD-10-CM

## 2021-12-17 LAB — RAPID URINE DRUG SCREEN, HOSP PERFORMED
Amphetamines: NOT DETECTED
Barbiturates: NOT DETECTED
Benzodiazepines: NOT DETECTED
Cocaine: NOT DETECTED
Opiates: NOT DETECTED
Tetrahydrocannabinol: NOT DETECTED

## 2021-12-17 LAB — CBC WITH DIFFERENTIAL/PLATELET
Abs Immature Granulocytes: 0.04 10*3/uL (ref 0.00–0.07)
Basophils Absolute: 0.1 10*3/uL (ref 0.0–0.1)
Basophils Relative: 0 %
Eosinophils Absolute: 0.1 10*3/uL (ref 0.0–0.5)
Eosinophils Relative: 1 %
HCT: 33.6 % — ABNORMAL LOW (ref 39.0–52.0)
Hemoglobin: 10.5 g/dL — ABNORMAL LOW (ref 13.0–17.0)
Immature Granulocytes: 0 %
Lymphocytes Relative: 20 %
Lymphs Abs: 2.3 10*3/uL (ref 0.7–4.0)
MCH: 29.7 pg (ref 26.0–34.0)
MCHC: 31.3 g/dL (ref 30.0–36.0)
MCV: 94.9 fL (ref 80.0–100.0)
Monocytes Absolute: 1 10*3/uL (ref 0.1–1.0)
Monocytes Relative: 8 %
Neutro Abs: 8.3 10*3/uL — ABNORMAL HIGH (ref 1.7–7.7)
Neutrophils Relative %: 71 %
Platelets: 265 10*3/uL (ref 150–400)
RBC: 3.54 MIL/uL — ABNORMAL LOW (ref 4.22–5.81)
RDW: 18.4 % — ABNORMAL HIGH (ref 11.5–15.5)
WBC: 11.7 10*3/uL — ABNORMAL HIGH (ref 4.0–10.5)
nRBC: 0 % (ref 0.0–0.2)

## 2021-12-17 LAB — URINALYSIS, ROUTINE W REFLEX MICROSCOPIC
Bilirubin Urine: NEGATIVE
Glucose, UA: >=500 mg/dL — AB
Ketones, ur: NEGATIVE mg/dL
Nitrite: NEGATIVE
Protein, ur: 30 mg/dL — AB
RBC / HPF: >50 RBC/hpf — ABNORMAL HIGH (ref 0–5)
Specific Gravity, Urine: 1.006 (ref 1.005–1.030)
WBC, UA: >50 WBC/hpf — ABNORMAL HIGH (ref 0–5)
pH: 6 (ref 5.0–8.0)

## 2021-12-17 LAB — ETHANOL: Alcohol, Ethyl (B): 229 mg/dL — ABNORMAL HIGH (ref <10)

## 2021-12-17 LAB — COMPREHENSIVE METABOLIC PANEL
ALT: 11 U/L (ref 0–44)
AST: 22 U/L (ref 15–41)
Albumin: 3.5 g/dL (ref 3.5–5.0)
Alkaline Phosphatase: 120 U/L (ref 38–126)
Anion gap: 14 (ref 5–15)
BUN: 18 mg/dL (ref 8–23)
CO2: 20 mmol/L — ABNORMAL LOW (ref 22–32)
Calcium: 9.1 mg/dL (ref 8.9–10.3)
Chloride: 112 mmol/L — ABNORMAL HIGH (ref 98–111)
Creatinine, Ser: 1.69 mg/dL — ABNORMAL HIGH (ref 0.61–1.24)
GFR, Estimated: 44 mL/min — ABNORMAL LOW (ref >60)
Glucose, Bld: 62 mg/dL — ABNORMAL LOW (ref 70–99)
Potassium: 3.6 mmol/L (ref 3.5–5.1)
Sodium: 146 mmol/L — ABNORMAL HIGH (ref 135–145)
Total Bilirubin: 0.4 mg/dL (ref 0.3–1.2)
Total Protein: 8 g/dL (ref 6.5–8.1)

## 2021-12-17 LAB — BRAIN NATRIURETIC PEPTIDE: B Natriuretic Peptide: 174.4 pg/mL — ABNORMAL HIGH (ref 0.0–100.0)

## 2021-12-17 LAB — CBG MONITORING, ED: Glucose-Capillary: 83 mg/dL (ref 70–99)

## 2021-12-17 MED ORDER — DEXTROSE 50 % IV SOLN
25.0000 mL | Freq: Once | INTRAVENOUS | Status: DC
  Filled 2021-12-17: qty 50

## 2021-12-17 MED ORDER — CEPHALEXIN 500 MG PO CAPS: 500.0000 mg | ORAL_CAPSULE | Freq: Two times a day (BID) | ORAL | 0 refills | Status: DC

## 2021-12-17 MED ORDER — CEPHALEXIN 250 MG PO CAPS
500.0000 mg | ORAL_CAPSULE | Freq: Once | ORAL | Status: AC
Start: 2021-12-17 — End: 2021-12-17
  Administered 2021-12-17: 500 mg via ORAL
  Filled 2021-12-17: qty 2

## 2021-12-17 NOTE — ED Notes (Signed)
Pt given meal bag and drink 

## 2021-12-17 NOTE — Progress Notes (Signed)
Right LE venous duplex study completed. Please see CV Proc for preliminary results.  Victorino Dike  Ailin Rochford BS, RVT 12/17/2021 3:42 PM
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
## 2021-12-17 NOTE — ED Notes (Addendum)
Pt in room. Updated on plan of care.

## 2021-12-17 NOTE — ED Provider Triage Note (Cosign Needed Addendum)
Emergency Medicine Provider Triage Evaluation Note  Corey Meyer , a 71 y.o. male  was evaluated in triage.  Pt complains of excessive drinking, frequent urination, swelling on legs. Hx of heart failure. Takes lasix. Reports depression, without SI/HI. Drinking 1/2 gallon of whiskey a day. Clinically intoxicated at time of eval. Patient reports he doesn't need an blood work. He reports catheter is leaking.  Review of Systems  Positive: Frequent urination, leg swelling, etoh abuse Negative: NVD, chest pain, shob  Physical Exam  BP (!) 154/100 (BP Location: Right Arm)   Pulse 89   Temp 98.6 F (37 C) (Oral)   Resp 20   Ht 5\' 9"  (1.753 m)   Wt 95.7 kg   SpO2 100%   BMI 31.16 kg/m  Gen:   Awake, clinically intoxicated Resp:  Normal effort MSK:   Moves extremities without difficulty  Other:    Medical Decision Making  Medically screening exam initiated at 11:43 AM.  Appropriate orders placed.  Corey Meyer was informed that the remainder of the evaluation will be completed by another provider, this initial triage assessment does not replace that evaluation, and the importance of remaining in the ED until their evaluation is complete.  Workup initiated   Corey Albright, PA-C 12/17/21 1145    Corey Meyer, [place], [place] 12/17/21 1148

## 2021-12-17 NOTE — ED Notes (Signed)
Pt ambulated with a steady gait out of room. Pt ready to leave the ER. Leg bag in place

## 2021-12-17 NOTE — ED Provider Notes (Signed)
Patient here with leaking of Foley catheter for urinary retention.  This was replaced and is draining better now.  He is awaiting labs. Denies any abdominal pain, chest pain or shortness of breath.  Patient with recent complicated hospitalization at outside facility about 3 weeks ago after cardiac arrest in setting of drug use.  Creatinine today is 1.7 which is stable from his discharge in May. Hemoglobin to 10 which is improved from discharge hemoglobin of 7.9 Labs concerning for alcohol intoxication as well as hypoglycemia. DVT study is negative for DVT  Patient given meal to eat.  He declines observation admission in the hospital for IV fluids and antibiotics for his complicated UTI. Though he is intoxicated he appears to have capacity to make decisions.  Denies chest pain or shortness of breath.  Blood sugar is improved to the 80s.  Patient tolerating p.o. and ambulatory.  He is adamant that he wants to go home.  He is unwilling to stay for IV fluids and IV antibiotics while his culture is pending.  He denies any abdominal pain or any back pain.  He appears to have capacity to leave AGAINST MEDICAL ADVICE.  Antibiotics sent to his pharmacy.   Glynn Octave, MD 12/17/21 Flossie Buffy

## 2021-12-17 NOTE — ED Provider Notes (Addendum)
[facility] EMERGENCY DEPARTMENT Provider Note   CSN: [id number] Arrival date & time: 12/17/21  1107     History  Chief Complaint  Patient presents with   Urinary Frequency        Alcohol Intoxication   Psychiatric Evaluation    Corey Meyer is a 71 y.o. male.  HPI 71 year old male presents with urinary frequency and urination around his Foley catheter.  About 3 weeks ago he had to go to an outside hospital to get a Foley catheter placed emergently due to retention.  He has had the catheter ever since.  Over the last 3 or 4 days he has noticed increasing urinary output both in and around the catheter.  He has also noticed some discharge.  Maybe a little bit of lower abdominal discomfort.  He denies chest pain, shortness of breath or vomiting.  He does drink half gallon of whiskey a day.  He reports that he is depressed but he thinks this is due to medical issues such as the catheter rather than a psychiatric issue.  He denies suicidal/homicidal thoughts or hallucinations.  Home Medications Prior to Admission medications   Medication Sig Start Date End Date Taking? Authorizing Provider  amLODipine (NORVASC) 10 MG tablet Take 1 tablet (10 mg total) by mouth every evening. 12/26/18   Yates Decamp, MD  cetirizine (ZYRTEC) 10 MG tablet Take 10 mg by mouth daily.    [provider]  diclofenac sodium (VOLTAREN) 1 % GEL Apply topically as needed.     [provider]  furosemide (LASIX) 20 MG tablet Take 1 tablet (20 mg total) by mouth daily. 02/10/19 05/11/19  Toniann Fail, NP  glipiZIDE (GLUCOTROL) 5 MG tablet Take 5 mg by mouth 2 (two) times daily before a meal.    [provider]  isosorbide-hydrALAZINE (BIDIL) 20-37.5 MG tablet Take 1 tablet by mouth 3 (three) times daily. 02/10/19   Toniann Fail, NP  metFORMIN (GLUCOPHAGE) 1000 MG tablet Take 1,000 mg by mouth 2 (two) times daily with a meal.    [provider]   metoprolol succinate (TOPROL XL) 100 MG 24 hr tablet Take 1 tablet (100 mg total) by mouth daily. Take with or immediately following a meal. 12/26/18 02/24/19  Yates Decamp, MD  pantoprazole (PROTONIX) 40 MG tablet Take 40 mg by mouth 2 (two) times daily before a meal.    [provider]  potassium chloride 20 MEQ TBCR Take 20 mEq by mouth daily. 01/06/19   Yates Decamp, MD  prazosin (MINIPRESS) 2 MG capsule Take 2 mg by mouth as needed.     [provider]  simvastatin (ZOCOR) 20 MG tablet Take 40 mg by mouth at bedtime.    [provider]  traZODone (DESYREL) 150 MG tablet Take 150 mg by mouth at bedtime as needed for sleep.    [provider]  vitamin B-12 (CYANOCOBALAMIN) 500 MCG tablet Take 500 mcg by mouth daily.    [provider]      Allergies    Haloperidol lactate    Review of Systems   Review of Systems  Respiratory:  Negative for shortness of breath.   Cardiovascular:  Negative for chest pain.  Gastrointestinal:  Positive for abdominal pain. Negative for vomiting.  Genitourinary:  Positive for frequency and penile discharge. Negative for dysuria.    Physical Exam Updated Vital Signs BP (!) 154/100 (BP Location: Right Arm)   Pulse 89   Temp 98.6 F (37  C) (Oral)   Resp 20   Ht 5\' 9"  (1.753 m)   Wt 95.7 kg   SpO2 100%   BMI 31.16 kg/m  Physical Exam Vitals and nursing note reviewed.  Constitutional:      Appearance: He is well-developed.  HENT:     Head: Normocephalic and atraumatic.  Cardiovascular:     Rate and Rhythm: Normal rate and regular rhythm.     Pulses:          Dorsalis pedis pulses are 2+ on the right side.     Heart sounds: Normal heart sounds.  Pulmonary:     Effort: Pulmonary effort is normal.     Breath sounds: Normal breath sounds.  Abdominal:     Palpations: Abdomen is soft.     Tenderness: There is abdominal tenderness in the suprapubic area.  Genitourinary:    Comments: Foley catheter is in place.   There does appear to be intermittent small amounts of urine coming around the catheter.  No overt discharge.  No penile swelling or testicular swelling Musculoskeletal:     Comments: Mild right lower leg swelling compared to the left.  Especially noted in the ankle but patient reports the calf is swollen though is not prominently so.  Skin:    General: Skin is warm and dry.  Neurological:     Mental Status: He is alert.     ED Results / Procedures / Treatments   Labs (all labs ordered are listed, but only abnormal results are displayed) Labs Reviewed  URINALYSIS, ROUTINE W REFLEX MICROSCOPIC - Abnormal; Notable for the following components:      Result Value   APPearance CLOUDY (*)    Glucose, UA >=500 (*)    Hgb urine dipstick LARGE (*)    Protein, ur 30 (*)    Leukocytes,Ua LARGE (*)    RBC / HPF >50 (*)    WBC, UA >50 (*)    Bacteria, UA FEW (*)    All other components within normal limits  RAPID URINE DRUG SCREEN, HOSP PERFORMED  COMPREHENSIVE METABOLIC PANEL  ETHANOL  CBC WITH DIFFERENTIAL/PLATELET  BRAIN NATRIURETIC PEPTIDE    EKG None  Radiology No results found.  Procedures Procedures    Medications Ordered in ED Medications - No data to display  ED Course/ Medical Decision Making/ A&P                           Medical Decision Making Amount and/or Complexity of Data Reviewed External Data Reviewed: notes. Labs: ordered. ECG/medicine tests: ordered.   Patient's Foley catheter seem to be malfunctioning which was probably causing the urine coming around the catheter.  Old catheter was removed and a new one was placed and over 1000 mL urine came out.  Currently labs are pending to evaluate renal function.  Patient seems like he has some mild intoxication but otherwise is not altered.  He had a recent admission to an outside hospital where he had cardiac arrest and shock.  Has a little bit of leg swelling in the right leg compared to left. Will get DVT  u/s.   He feels depressed but denies SI/HI. No psychosis.   Abd discomfort gone after retention resolved  Care transferred to Dr. .        Final Clinical Impression(s) / ED Diagnoses Final diagnoses:  None    Rx / DC Orders ED Discharge Orders     None  Pricilla Loveless, MD 12/17/21 1533    Pricilla Loveless, MD 12/17/21 1534

## 2021-12-17 NOTE — ED Notes (Signed)
Previous cather removed. Pt attempted to urinate in urinal. No success. Pts bladder scan showed of urine. MD notified.

## 2021-12-17 NOTE — Discharge Instructions (Addendum)
Catheter was replaced and appears to be draining appropriately.  You are started on antibiotics for a urinary tract infection.  It was recommended you stay in the hospital today for IV fluids and antibiotics but you declined.  You should follow-up with your primary doctor as well as the urologist for removal of your catheter.  Return to the ED with new or worsening symptoms

## 2021-12-17 NOTE — ED Notes (Signed)
Pt refused labs.  

## 2021-12-30 ENCOUNTER — Inpatient Hospital Stay (HOSPITAL_COMMUNITY)
Admission: EM | Admit: 2021-12-30 | Discharge: 2022-01-04 | DRG: 698 | Disposition: A | Discharge status: Home / Self Care | Payer: No Typology Code available for payment source | Attending: Internal Medicine | Admitting: Internal Medicine

## 2021-12-30 ENCOUNTER — Encounter (HOSPITAL_COMMUNITY): Payer: Self-pay | Admitting: Internal Medicine

## 2021-12-30 ENCOUNTER — Emergency Department (HOSPITAL_COMMUNITY): Payer: No Typology Code available for payment source

## 2021-12-30 DIAGNOSIS — F10239 Alcohol dependence with withdrawal, unspecified: Secondary | ICD-10-CM | POA: Diagnosis present

## 2021-12-30 DIAGNOSIS — F1410 Cocaine abuse, uncomplicated: Secondary | ICD-10-CM | POA: Diagnosis present

## 2021-12-30 DIAGNOSIS — Y906 Blood alcohol level of 120-199 mg/100 ml: Secondary | ICD-10-CM | POA: Diagnosis present

## 2021-12-30 DIAGNOSIS — Y846 Urinary catheterization as the cause of abnormal reaction of the patient, or of later complication, without mention of misadventure at the time of the procedure: Secondary | ICD-10-CM | POA: Diagnosis present

## 2021-12-30 DIAGNOSIS — F10932 Alcohol use, unspecified with withdrawal with perceptual disturbance: Secondary | ICD-10-CM | POA: Diagnosis not present

## 2021-12-30 DIAGNOSIS — Z888 Allergy status to other drugs, medicaments and biological substances status: Secondary | ICD-10-CM

## 2021-12-30 DIAGNOSIS — E876 Hypokalemia: Secondary | ICD-10-CM | POA: Diagnosis present

## 2021-12-30 DIAGNOSIS — F10939 Alcohol use, unspecified with withdrawal, unspecified: Secondary | ICD-10-CM | POA: Diagnosis present

## 2021-12-30 DIAGNOSIS — Z8674 Personal history of sudden cardiac arrest: Secondary | ICD-10-CM

## 2021-12-30 DIAGNOSIS — I482 Chronic atrial fibrillation, unspecified: Secondary | ICD-10-CM | POA: Diagnosis present

## 2021-12-30 DIAGNOSIS — R338 Other retention of urine: Secondary | ICD-10-CM | POA: Diagnosis not present

## 2021-12-30 DIAGNOSIS — E162 Hypoglycemia, unspecified: Principal | ICD-10-CM

## 2021-12-30 DIAGNOSIS — E669 Obesity, unspecified: Secondary | ICD-10-CM | POA: Diagnosis present

## 2021-12-30 DIAGNOSIS — R569 Unspecified convulsions: Secondary | ICD-10-CM | POA: Diagnosis present

## 2021-12-30 DIAGNOSIS — E1165 Type 2 diabetes mellitus with hyperglycemia: Secondary | ICD-10-CM | POA: Diagnosis not present

## 2021-12-30 DIAGNOSIS — F17210 Nicotine dependence, cigarettes, uncomplicated: Secondary | ICD-10-CM | POA: Diagnosis present

## 2021-12-30 DIAGNOSIS — N179 Acute kidney failure, unspecified: Secondary | ICD-10-CM | POA: Diagnosis present

## 2021-12-30 DIAGNOSIS — J9690 Respiratory failure, unspecified, unspecified whether with hypoxia or hypercapnia: Secondary | ICD-10-CM | POA: Diagnosis present

## 2021-12-30 DIAGNOSIS — T83091A Other mechanical complication of indwelling urethral catheter, initial encounter: Secondary | ICD-10-CM | POA: Diagnosis present

## 2021-12-30 DIAGNOSIS — Y738 Miscellaneous gastroenterology and urology devices associated with adverse incidents, not elsewhere classified: Secondary | ICD-10-CM | POA: Diagnosis present

## 2021-12-30 DIAGNOSIS — F1510 Other stimulant abuse, uncomplicated: Secondary | ICD-10-CM | POA: Diagnosis present

## 2021-12-30 DIAGNOSIS — K2920 Alcoholic gastritis without bleeding: Secondary | ICD-10-CM | POA: Diagnosis present

## 2021-12-30 DIAGNOSIS — E1122 Type 2 diabetes mellitus with diabetic chronic kidney disease: Secondary | ICD-10-CM | POA: Diagnosis present

## 2021-12-30 DIAGNOSIS — Z6830 Body mass index (BMI) 30.0-30.9, adult: Secondary | ICD-10-CM

## 2021-12-30 DIAGNOSIS — Z7984 Long term (current) use of oral hypoglycemic drugs: Secondary | ICD-10-CM

## 2021-12-30 DIAGNOSIS — T83511A Infection and inflammatory reaction due to indwelling urethral catheter, initial encounter: Principal | ICD-10-CM | POA: Diagnosis present

## 2021-12-30 DIAGNOSIS — A419 Sepsis, unspecified organism: Secondary | ICD-10-CM | POA: Diagnosis present

## 2021-12-30 DIAGNOSIS — N1832 Chronic kidney disease, stage 3b: Secondary | ICD-10-CM | POA: Diagnosis present

## 2021-12-30 DIAGNOSIS — I130 Hypertensive heart and chronic kidney disease with heart failure and stage 1 through stage 4 chronic kidney disease, or unspecified chronic kidney disease: Secondary | ICD-10-CM | POA: Diagnosis present

## 2021-12-30 DIAGNOSIS — I5022 Chronic systolic (congestive) heart failure: Secondary | ICD-10-CM | POA: Diagnosis present

## 2021-12-30 DIAGNOSIS — N136 Pyonephrosis: Secondary | ICD-10-CM | POA: Diagnosis present

## 2021-12-30 DIAGNOSIS — G9341 Metabolic encephalopathy: Secondary | ICD-10-CM | POA: Diagnosis present

## 2021-12-30 DIAGNOSIS — N189 Chronic kidney disease, unspecified: Secondary | ICD-10-CM | POA: Diagnosis not present

## 2021-12-30 DIAGNOSIS — Z79899 Other long term (current) drug therapy: Secondary | ICD-10-CM

## 2021-12-30 DIAGNOSIS — E860 Dehydration: Secondary | ICD-10-CM | POA: Diagnosis present

## 2021-12-30 DIAGNOSIS — E11649 Type 2 diabetes mellitus with hypoglycemia without coma: Secondary | ICD-10-CM | POA: Diagnosis present

## 2021-12-30 DIAGNOSIS — N320 Bladder-neck obstruction: Secondary | ICD-10-CM | POA: Diagnosis present

## 2021-12-30 DIAGNOSIS — E7800 Pure hypercholesterolemia, unspecified: Secondary | ICD-10-CM | POA: Diagnosis present

## 2021-12-30 DIAGNOSIS — F10229 Alcohol dependence with intoxication, unspecified: Secondary | ICD-10-CM | POA: Diagnosis present

## 2021-12-30 DIAGNOSIS — I509 Heart failure, unspecified: Secondary | ICD-10-CM

## 2021-12-30 LAB — URINALYSIS, ROUTINE W REFLEX MICROSCOPIC
Bilirubin Urine: NEGATIVE
Glucose, UA: 50 mg/dL — AB
Ketones, ur: NEGATIVE mg/dL
Nitrite: NEGATIVE
Protein, ur: 100 mg/dL — AB
RBC / HPF: >50 RBC/hpf — ABNORMAL HIGH (ref 0–5)
Specific Gravity, Urine: 1.01 (ref 1.005–1.030)
WBC, UA: >50 WBC/hpf — ABNORMAL HIGH (ref 0–5)
pH: 6 (ref 5.0–8.0)

## 2021-12-30 LAB — RAPID URINE DRUG SCREEN, HOSP PERFORMED
Amphetamines: NOT DETECTED
Barbiturates: NOT DETECTED
Benzodiazepines: NOT DETECTED
Cocaine: NOT DETECTED
Opiates: NOT DETECTED
Tetrahydrocannabinol: NOT DETECTED

## 2021-12-30 LAB — CBC WITH DIFFERENTIAL/PLATELET
Abs Immature Granulocytes: 0.02 10*3/uL (ref 0.00–0.07)
Basophils Absolute: 0 10*3/uL (ref 0.0–0.1)
Basophils Relative: 0 %
Eosinophils Absolute: 0 10*3/uL (ref 0.0–0.5)
Eosinophils Relative: 0 %
HCT: 32.7 % — ABNORMAL LOW (ref 39.0–52.0)
Hemoglobin: 10.1 g/dL — ABNORMAL LOW (ref 13.0–17.0)
Immature Granulocytes: 0 %
Lymphocytes Relative: 8 %
Lymphs Abs: 0.7 10*3/uL (ref 0.7–4.0)
MCH: 29.3 pg (ref 26.0–34.0)
MCHC: 30.9 g/dL (ref 30.0–36.0)
MCV: 94.8 fL (ref 80.0–100.0)
Monocytes Absolute: 0.7 10*3/uL (ref 0.1–1.0)
Monocytes Relative: 8 %
Neutro Abs: 7.3 10*3/uL (ref 1.7–7.7)
Neutrophils Relative %: 84 %
Platelets: 325 10*3/uL (ref 150–400)
RBC: 3.45 MIL/uL — ABNORMAL LOW (ref 4.22–5.81)
RDW: 18.9 % — ABNORMAL HIGH (ref 11.5–15.5)
WBC: 8.8 10*3/uL (ref 4.0–10.5)
nRBC: 0 % (ref 0.0–0.2)

## 2021-12-30 LAB — COMPREHENSIVE METABOLIC PANEL
ALT: 10 U/L (ref 0–44)
AST: 20 U/L (ref 15–41)
Albumin: 3.4 g/dL — ABNORMAL LOW (ref 3.5–5.0)
Alkaline Phosphatase: 108 U/L (ref 38–126)
Anion gap: 13 (ref 5–15)
BUN: 28 mg/dL — ABNORMAL HIGH (ref 8–23)
CO2: 17 mmol/L — ABNORMAL LOW (ref 22–32)
Calcium: 9 mg/dL (ref 8.9–10.3)
Chloride: 109 mmol/L (ref 98–111)
Creatinine, Ser: 2.92 mg/dL — ABNORMAL HIGH (ref 0.61–1.24)
GFR, Estimated: 23 mL/min — ABNORMAL LOW (ref >60)
Glucose, Bld: 86 mg/dL (ref 70–99)
Potassium: 4.1 mmol/L (ref 3.5–5.1)
Sodium: 139 mmol/L (ref 135–145)
Total Bilirubin: 0.5 mg/dL (ref 0.3–1.2)
Total Protein: 7.5 g/dL (ref 6.5–8.1)

## 2021-12-30 LAB — ACETAMINOPHEN LEVEL: Acetaminophen (Tylenol), Serum: <10 ug/mL — ABNORMAL LOW (ref 10–30)

## 2021-12-30 LAB — GLUCOSE, CAPILLARY
Glucose-Capillary: 19 mg/dL — CL (ref 70–99)
Glucose-Capillary: 20 mg/dL — CL (ref 70–99)
Glucose-Capillary: 83 mg/dL (ref 70–99)
Glucose-Capillary: 119 mg/dL — ABNORMAL HIGH (ref 70–99)
Glucose-Capillary: 44 mg/dL — CL (ref 70–99)

## 2021-12-30 LAB — CBG MONITORING, ED
Glucose-Capillary: 66 mg/dL — ABNORMAL LOW (ref 70–99)
Glucose-Capillary: 92 mg/dL (ref 70–99)
Glucose-Capillary: 61 mg/dL — ABNORMAL LOW (ref 70–99)
Glucose-Capillary: 93 mg/dL (ref 70–99)
Glucose-Capillary: 83 mg/dL (ref 70–99)

## 2021-12-30 LAB — ETHANOL: Alcohol, Ethyl (B): 182 mg/dL — ABNORMAL HIGH (ref <10)

## 2021-12-30 LAB — HIV ANTIBODY (ROUTINE TESTING W REFLEX): HIV Screen 4th Generation wRfx: NONREACTIVE

## 2021-12-30 LAB — MAGNESIUM: Magnesium: 2.3 mg/dL (ref 1.7–2.4)

## 2021-12-30 LAB — PROTIME-INR
INR: 1 (ref 0.8–1.2)
Prothrombin Time: 13.5 seconds (ref 11.4–15.2)

## 2021-12-30 LAB — SALICYLATE LEVEL: Salicylate Lvl: <7 mg/dL — ABNORMAL LOW (ref 7.0–30.0)

## 2021-12-30 LAB — PHOSPHORUS: Phosphorus: 5.6 mg/dL — ABNORMAL HIGH (ref 2.5–4.6)

## 2021-12-30 MED ORDER — DOCUSATE SODIUM 100 MG PO CAPS: 100.0000 mg | ORAL_CAPSULE | Freq: Two times a day (BID) | ORAL | Status: DC | PRN

## 2021-12-30 MED ORDER — DEXTROSE IN LACTATED RINGERS 5 % IV SOLN: INTRAVENOUS | Status: DC

## 2021-12-30 MED ORDER — THIAMINE HCL 100 MG/ML IJ SOLN
100.0000 mg | Freq: Every day | INTRAMUSCULAR | Status: DC
  Administered 2021-12-30 – 2021-12-31 (×2): 100 mg via INTRAVENOUS
  Filled 2021-12-30 (×2): qty 2

## 2021-12-30 MED ORDER — SODIUM CHLORIDE 0.9 % IV SOLN
250.0000 mL | INTRAVENOUS | Status: DC | PRN
  Administered 2022-01-04: 250 mL via INTRAVENOUS

## 2021-12-30 MED ORDER — DEXTROSE 50 % IV SOLN
INTRAVENOUS | Status: AC
  Filled 2021-12-30: qty 50

## 2021-12-30 MED ORDER — THIAMINE HCL 100 MG PO TABS
100.0000 mg | ORAL_TABLET | Freq: Every day | ORAL | Status: DC
Start: 2021-12-30 — End: 2022-01-01

## 2021-12-30 MED ORDER — PHENOBARBITAL SODIUM 65 MG/ML IJ SOLN: 65.0000 mg | Freq: Three times a day (TID) | INTRAMUSCULAR | Status: DC

## 2021-12-30 MED ORDER — SODIUM CHLORIDE 0.9% FLUSH: 3.0000 mL | INTRAVENOUS | Status: DC | PRN

## 2021-12-30 MED ORDER — DEXTROSE 50 % IV SOLN
25.0000 g | INTRAVENOUS | Status: AC
Start: 2021-12-30 — End: 2021-12-30
  Administered 2021-12-30: 25 g via INTRAVENOUS

## 2021-12-30 MED ORDER — LORAZEPAM 1 MG PO TABS: 0.0000 mg | ORAL_TABLET | Freq: Four times a day (QID) | ORAL | Status: DC

## 2021-12-30 MED ORDER — DEXTROSE 50 % IV SOLN
INTRAVENOUS | Status: AC
  Administered 2021-12-30: 50 mL
  Filled 2021-12-30: qty 50

## 2021-12-30 MED ORDER — LORAZEPAM 2 MG/ML IJ SOLN
2.0000 mg | Freq: Once | INTRAMUSCULAR | Status: AC
  Administered 2021-12-30: 2 mg via INTRAVENOUS

## 2021-12-30 MED ORDER — PHENOBARBITAL SODIUM 130 MG/ML IJ SOLN
97.5000 mg | Freq: Three times a day (TID) | INTRAMUSCULAR | Status: AC
  Administered 2021-12-30 – 2022-01-01 (×6): 97.5 mg via INTRAVENOUS
  Filled 2021-12-30 (×6): qty 1

## 2021-12-30 MED ORDER — LORAZEPAM 2 MG/ML IJ SOLN
INTRAMUSCULAR | Status: AC
  Filled 2021-12-30: qty 1

## 2021-12-30 MED ORDER — SODIUM CHLORIDE 0.9% FLUSH
3.0000 mL | Freq: Two times a day (BID) | INTRAVENOUS | Status: DC
  Administered 2021-12-30 – 2022-01-04 (×10): 3 mL via INTRAVENOUS

## 2021-12-30 MED ORDER — LACTATED RINGERS IV BOLUS
500.0000 mL | Freq: Once | INTRAVENOUS | Status: AC
  Administered 2021-12-30: 500 mL via INTRAVENOUS

## 2021-12-30 MED ORDER — LORAZEPAM 1 MG PO TABS: 0.0000 mg | ORAL_TABLET | Freq: Two times a day (BID) | ORAL | Status: DC

## 2021-12-30 MED ORDER — DEXTROSE-NACL 10-0.45 % IV SOLN
INTRAVENOUS | Status: DC
  Filled 2021-12-30 (×2): qty 1000

## 2021-12-30 MED ORDER — SODIUM CHLORIDE 0.9 % IV SOLN
260.0000 mg | Freq: Once | INTRAVENOUS | Status: DC
  Filled 2021-12-30: qty 2

## 2021-12-30 MED ORDER — HEPARIN SODIUM (PORCINE) 5000 UNIT/ML IJ SOLN
5000.0000 [IU] | Freq: Three times a day (TID) | INTRAMUSCULAR | Status: DC
  Administered 2021-12-30 – 2022-01-03 (×12): 5000 [IU] via SUBCUTANEOUS
  Filled 2021-12-30 (×12): qty 1

## 2021-12-30 MED ORDER — DEXTROSE 50 % IV SOLN
INTRAVENOUS | Status: AC
  Filled 2021-12-30: qty 100

## 2021-12-30 MED ORDER — LORAZEPAM 2 MG/ML IJ SOLN: 0.0000 mg | Freq: Two times a day (BID) | INTRAMUSCULAR | Status: DC

## 2021-12-30 MED ORDER — SODIUM CHLORIDE 0.9 % IV SOLN
1.0000 g | INTRAVENOUS | Status: DC
  Administered 2021-12-30: 1 g via INTRAVENOUS
  Filled 2021-12-30: qty 10

## 2021-12-30 MED ORDER — LORAZEPAM 2 MG/ML IJ SOLN
1.0000 mg | INTRAMUSCULAR | Status: DC | PRN
  Administered 2021-12-30 – 2022-01-01 (×8): 1 mg via INTRAVENOUS
  Filled 2021-12-30 (×8): qty 1

## 2021-12-30 MED ORDER — ACETAMINOPHEN 325 MG PO TABS
650.0000 mg | ORAL_TABLET | Freq: Four times a day (QID) | ORAL | Status: DC | PRN
  Administered 2021-12-30: 650 mg via ORAL
  Filled 2021-12-30: qty 2

## 2021-12-30 MED ORDER — POLYETHYLENE GLYCOL 3350 17 G PO PACK: 17.0000 g | PACK | Freq: Every day | ORAL | Status: DC | PRN

## 2021-12-30 MED ORDER — LORAZEPAM 2 MG/ML IJ SOLN
0.0000 mg | Freq: Four times a day (QID) | INTRAMUSCULAR | Status: DC
  Administered 2021-12-30: 2 mg via INTRAVENOUS
  Filled 2021-12-30: qty 1

## 2021-12-30 MED ORDER — DEXTROSE-NACL 10-0.45 % IV SOLN
INTRAVENOUS | Status: DC
  Administered 2021-12-31: 1000 mL via INTRAVENOUS
  Filled 2021-12-30 (×10): qty 1000

## 2021-12-30 MED ORDER — DEXTROSE 50 % IV SOLN
25.0000 g | INTRAVENOUS | Status: AC
  Administered 2021-12-30: 25 g via INTRAVENOUS

## 2021-12-30 MED ORDER — PHENOBARBITAL SODIUM 65 MG/ML IJ SOLN
65.0000 mg | Freq: Three times a day (TID) | INTRAMUSCULAR | Status: AC
  Administered 2022-01-01 – 2022-01-03 (×6): 65 mg via INTRAVENOUS
  Filled 2021-12-30 (×6): qty 1

## 2021-12-30 MED ORDER — PHENOBARBITAL SODIUM 130 MG/ML IJ SOLN: 97.5000 mg | Freq: Three times a day (TID) | INTRAMUSCULAR | Status: DC

## 2021-12-30 MED ORDER — PHENOBARBITAL SODIUM 65 MG/ML IJ SOLN: 32.5000 mg | Freq: Three times a day (TID) | INTRAMUSCULAR | Status: DC

## 2021-12-30 MED ORDER — PHENOBARBITAL SODIUM 65 MG/ML IJ SOLN
32.5000 mg | Freq: Three times a day (TID) | INTRAMUSCULAR | Status: DC
  Administered 2022-01-03 – 2022-01-04 (×2): 32.5 mg via INTRAVENOUS
  Filled 2021-12-30 (×2): qty 1

## 2021-12-30 MED ORDER — DEXTROSE-NACL 5-0.45 % IV SOLN: INTRAVENOUS | Status: DC

## 2021-12-30 NOTE — ED Notes (Signed)
Notified Dr. Francine Graven of temp 100.2

## 2021-12-30 NOTE — ED Notes (Signed)
Patient transported to CT 

## 2021-12-30 NOTE — Progress Notes (Signed)
Pharmacy Phenobarbital Consult Note   Pharmacy Consult for IV Phenobarbital  Indication: Alcohol Withdrawal Treatment  Labs:  Lab Results  Component Value Date   CREATININE 2.92 (H) 12/30/2021   AST 20 12/30/2021   ALT 10 12/30/2021   ALKPHOS 108 12/30/2021    Nursing Bedside Screening:   Patient still in ED - to be performed Risk assessment performed by MD Dewald  Assessment: Corey Meyer is a 71 y.o. year old male admitted on 12/30/2021. Patients meets criteria for high risk dosing of phenobarbital per MD Dewald. Pharmacy consulted to dose phenobarbital.    Plan: Start high risk taper IV: Phenobarbital 97.5mg  IV push q 8h x 6 doses followed by Phenobarbital 65mg  IV push q 8h x 6 doses followed by Phenobarbital 32.4mg  IV push q 8h x 6 doses Start Lorazepam 1mg  IV q4h prn agitation     Thank you for allowing pharmacy to participate in this patient's care.  , PharmD, BCPS 12/30/2021 6:28 PM ED Clinical Pharmacist -  [phone]

## 2021-12-30 NOTE — ED Triage Notes (Signed)
Patient BIB [facility] from home. Was found by roommate on couch unresponsive this AM around 6. Patient was found to have pinpoint pupils, unresponsive, CBG of 21 initially, found with emesis around him. EMS did need to provide patient with rescue breathing initially, but patient improved enroute w/ spontaneous respirations. EMS treated with D10, and .4 of Narcan with improvement in respiratory status and increase in CBG to 119. Pt also give 5 mg of Midazolam enroute for 5 minutes of tonic clonic seizing. Pt with known ETOH abuse. VSS upon arrival. Pt moaning in pain, but opening eyes and able to state name upon arrival.

## 2021-12-30 NOTE — Progress Notes (Signed)
eLink Physician-Brief Progress Note Patient Name: Corey Meyer DOB: [birth year] MRN: [id number]   Date of Service  12/30/2021  HPI/Events of Note  Notified of fever with Tmax 101.52F.  Review of UA showed moderate leukocytes.  CXR with no acute infiltrates.    eICU Interventions  Cultures drawn earlier.  Start on Ceftriaxone.  Tylenol ordered PRN.     Intervention Category Intermediate Interventions: Infection - evaluation and management  [name] 12/30/2021, 8:17 PM

## 2021-12-30 NOTE — Consult Note (Signed)
NAME:  Corey Meyer, MRN:  [id number], DOB:  10-Feb-1951, LOS: 0 ADMISSION DATE:  12/30/2021, CONSULTATION DATE: 12/30/2021 REFERRING MD: [facility], CHIEF COMPLAINT: Alcohol withdrawal  History of Present Illness:  Corey Meyer is a 71 year old male with a history of heavy substance abuse alcohol drinks approximately 1/5 of either vodka or [place] club on a daily basis.  He was recently admitted for sepsis and had cardiac arrest.  He returns today with heavy EtOH abuse tremulous confusion [facility] hospitalist requested Precedex. We will transfer him to the intensive care unit continue CIWA protocol along with phenobarbital and possibly Precedex.   Pertinent  Medical History   Past Medical History:  Diagnosis Date   Controlled diabetes mellitus type II without complication ([place])    Essential hypertension    Hypercholesteremia      Significant Hospital Events: Including procedures, antibiotic start and stop dates in addition to other pertinent events   12/30/2021 pulmonary critical care take over  Interim History / Subjective:  Alcohol abuse with confusion withdrawal symptoms.  Objective   Blood pressure (!) 147/123, pulse 100, temperature 98 F (36.7 C), temperature source Temporal, resp. rate 12, height 5\' 9"  (1.753 m), weight 95 kg, SpO2 100 %.       No intake or output data in the 24 hours ending 12/30/21 1722 Filed Weights   12/30/21 1412  Weight: 95 kg    Examination: General: Confused and tremulous male he is confused HENT: No JVD or lymphadenopathy is appreciated Lungs: Decreased breath sounds in the bases Cardiovascular: Heart sounds are regular Abdomen: Obese soft Extremities: 1+ edema Neuro: Tremulous follows some commands wants to go home  GU: Foley catheter is in place  Resolved Hospital Problem list     Assessment & Plan:  Severe hypoglycemia in the setting of alcohol ingestion daily basis complicated by oral diabetic medication not being taken properly due to  altered mental status. Admit to intensive care unit Sliding-scale insulin protocol Hold all oral glycemic medications  Altered mental status  in the setting of alcohol withdrawal CIWA protocol Phenobarbital protocol Folic acid Thiamine May need  Acute renal insufficiency Lab Results  Component Value Date   CREATININE 2.92 (H) 12/30/2021   CREATININE 1.69 (H) 12/17/2021   CREATININE 2.31 (H) 02/08/2019   Avoid nephrotoxins Hydration  History of upper GI bleed Recent Labs    12/30/21 1400  HGB 10.1*  Monitor hemoglobin Hold anticoagulant     Best Practice (right click and "Reselect all SmartList Selections" daily)   Diet/type: Regular consistency (see orders) DVT prophylaxis: SCD GI prophylaxis: PPI Lines: N/A Foley:  N/A Code Status:  full code Last date of multidisciplinary goals of care discussion tbd  Labs   CBC: Recent Labs  Lab 12/30/21 1400  WBC 8.8  NEUTROABS 7.3  HGB 10.1*  HCT 32.7*  MCV 94.8  PLT [id number]    Basic Metabolic Panel: Recent Labs  Lab 12/30/21 1400  NA 139  K 4.1  CL 109  CO2 17*  GLUCOSE 86  BUN 28*  CREATININE 2.92*  CALCIUM 9.0   GFR: Estimated Creatinine Clearance: 27.5 mL/min (A) (by C-G formula based on SCr of 2.92 mg/dL (H)). Recent Labs  Lab 12/30/21 1400  WBC 8.8    Liver Function Tests: Recent Labs  Lab 12/30/21 1400  AST 20  ALT 10  ALKPHOS 108  BILITOT 0.5  PROT 7.5  ALBUMIN 3.4*   No results for input(s): "LIPASE", "AMYLASE" in the last 168 hours. No results for  input(s): "AMMONIA" in the last 168 hours.  ABG No results found for: "PHART", "PCO2ART", "PO2ART", "HCO3", "TCO2", "ACIDBASEDEF", "O2SAT"   Coagulation Profile: No results for input(s): "INR", "PROTIME" in the last 168 hours.  Cardiac Enzymes: No results for input(s): "CKTOTAL", "CKMB", "CKMBINDEX", "TROPONINI" in the last 168 hours.  HbA1C: Hgb A1c MFr Bld  Date/Time Value Ref Range Status  12/25/2018 07:04 PM 6.3 (H) 4.8 -  5.6 % Final    Comment:    (NOTE) Pre diabetes:          5.7%-6.4% Diabetes:              >6.4% Glycemic control for   <7.0% adults with diabetes     CBG: Recent Labs  Lab 12/30/21 1356 12/30/21 1418 12/30/21 1502 12/30/21 1602  GLUCAP 92 66* 61* 93    Review of Systems:   na  Past Medical History:  He,  has a past medical history of Controlled diabetes mellitus type II without complication (HCC), Essential hypertension, and Hypercholesteremia.   Surgical History:   Past Surgical History:  Procedure Laterality Date   LEFT HEART CATH AND CORONARY ANGIOGRAPHY N/A 12/25/2018   Procedure: LEFT HEART CATH AND CORONARY ANGIOGRAPHY;  Surgeon: Yates Decamp, MD;  Location: [facility] INVASIVE CV LAB;  Service: Cardiovascular;  Laterality: N/A;     Social History:   reports that he has been smoking cigarettes. He has a 30.00 pack-year smoking history. He has never used smokeless tobacco. He reports current alcohol use of about 6.0 standard drinks of alcohol per week. He reports current drug use. Drug: Cocaine.   Family History:  His family history is not on file.   Allergies Allergies  Allergen Reactions   Haloperidol Lactate     Other reaction(s): Xerostomia     Home Medications  Prior to Admission medications   Medication Sig Start Date End Date Taking? Authorizing Provider  amLODipine (NORVASC) 10 MG tablet Take 1 tablet (10 mg total) by mouth every evening. 12/26/18   Yates Decamp, MD  cephALEXin (KEFLEX) 500 MG capsule Take 1 capsule (500 mg total) by mouth 2 (two) times daily. 12/17/21   Rancour, Jeannett Senior, MD  cetirizine (ZYRTEC) 10 MG tablet Take 10 mg by mouth daily.    [provider]  diclofenac sodium (VOLTAREN) 1 % GEL Apply topically as needed.     [provider]  furosemide (LASIX) 20 MG tablet Take 1 tablet (20 mg total) by mouth daily. 02/10/19 05/11/19  Toniann Fail, NP  glipiZIDE (GLUCOTROL) 5 MG tablet Take 5 mg by mouth 2 (two) times daily  before a meal.    [provider]  isosorbide-hydrALAZINE (BIDIL) 20-37.5 MG tablet Take 1 tablet by mouth 3 (three) times daily. 02/10/19   Toniann Fail, NP  metFORMIN (GLUCOPHAGE) 1000 MG tablet Take 1,000 mg by mouth 2 (two) times daily with a meal.    [provider]  metoprolol succinate (TOPROL XL) 100 MG 24 hr tablet Take 1 tablet (100 mg total) by mouth daily. Take with or immediately following a meal. 12/26/18 02/24/19  Yates Decamp, MD  pantoprazole (PROTONIX) 40 MG tablet Take 40 mg by mouth 2 (two) times daily before a meal.    [provider]  potassium chloride 20 MEQ TBCR Take 20 mEq by mouth daily. 01/06/19   Yates Decamp, MD  prazosin (MINIPRESS) 2 MG capsule Take 2 mg by mouth as needed.     [provider]  simvastatin (ZOCOR) 20 MG  tablet Take 40 mg by mouth at bedtime.    [provider]  traZODone (DESYREL) 150 MG tablet Take 150 mg by mouth at bedtime as needed for sleep.    [provider]  vitamin B-12 (CYANOCOBALAMIN) 500 MCG tablet Take 500 mcg by mouth daily.    [provider]     Critical care time: 35 min    Brett Canales Bonney Berres ACNP Acute Care Nurse Practitioner [facility] Pul[facility]/Critical Care Please consult Amion 12/30/2021, 5:23 PM

## 2021-12-30 NOTE — Progress Notes (Signed)
eLink Physician-Brief Progress Note Patient Name: Loomis Anacker DOB: Nov 27, 1950 MRN: [id number]   Date of Service  12/30/2021  HPI/Events of Note  Notified of glucose of 20 while getting D5LR@75cc /hr.  Pt given D50 as per hypoglycemia protocol. Pt is awake and oriented as per RN.   RN also reported some blood clots in the foley.   eICU Interventions  Change IVFs to D50 1/2 NS @ 75cc/hr.  Monitor foley for obstruction but is draining so far with some manual manipulation.      Intervention Category Intermediate Interventions: Other:  [name] 12/30/2021, 7:42 PM
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
## 2021-12-31 ENCOUNTER — Inpatient Hospital Stay (HOSPITAL_COMMUNITY): Payer: No Typology Code available for payment source

## 2021-12-31 LAB — GLUCOSE, CAPILLARY
Glucose-Capillary: 133 mg/dL — ABNORMAL HIGH (ref 70–99)
Glucose-Capillary: 23 mg/dL — CL (ref 70–99)
Glucose-Capillary: 119 mg/dL — ABNORMAL HIGH (ref 70–99)
Glucose-Capillary: 77 mg/dL (ref 70–99)
Glucose-Capillary: 105 mg/dL — ABNORMAL HIGH (ref 70–99)
Glucose-Capillary: 149 mg/dL — ABNORMAL HIGH (ref 70–99)
Glucose-Capillary: 185 mg/dL — ABNORMAL HIGH (ref 70–99)
Glucose-Capillary: 103 mg/dL — ABNORMAL HIGH (ref 70–99)
Glucose-Capillary: 62 mg/dL — ABNORMAL LOW (ref 70–99)
Glucose-Capillary: 110 mg/dL — ABNORMAL HIGH (ref 70–99)
Glucose-Capillary: 77 mg/dL (ref 70–99)
Glucose-Capillary: 72 mg/dL (ref 70–99)
Glucose-Capillary: 74 mg/dL (ref 70–99)
Glucose-Capillary: 92 mg/dL (ref 70–99)
Glucose-Capillary: 105 mg/dL — ABNORMAL HIGH (ref 70–99)
Glucose-Capillary: 123 mg/dL — ABNORMAL HIGH (ref 70–99)
Glucose-Capillary: 158 mg/dL — ABNORMAL HIGH (ref 70–99)
Glucose-Capillary: 229 mg/dL — ABNORMAL HIGH (ref 70–99)

## 2021-12-31 LAB — BASIC METABOLIC PANEL
Anion gap: 7 (ref 5–15)
BUN: 33 mg/dL — ABNORMAL HIGH (ref 8–23)
CO2: 21 mmol/L — ABNORMAL LOW (ref 22–32)
Calcium: 9.2 mg/dL (ref 8.9–10.3)
Chloride: 109 mmol/L (ref 98–111)
Creatinine, Ser: 3.7 mg/dL — ABNORMAL HIGH (ref 0.61–1.24)
GFR, Estimated: 17 mL/min — ABNORMAL LOW (ref >60)
Glucose, Bld: 83 mg/dL (ref 70–99)
Potassium: 4.2 mmol/L (ref 3.5–5.1)
Sodium: 137 mmol/L (ref 135–145)

## 2021-12-31 LAB — MAGNESIUM
Magnesium: 2.1 mg/dL (ref 1.7–2.4)
Magnesium: 1.8 mg/dL (ref 1.7–2.4)

## 2021-12-31 LAB — CBC
HCT: 30.7 % — ABNORMAL LOW (ref 39.0–52.0)
Hemoglobin: 9.6 g/dL — ABNORMAL LOW (ref 13.0–17.0)
MCH: 29.2 pg (ref 26.0–34.0)
MCHC: 31.3 g/dL (ref 30.0–36.0)
MCV: 93.3 fL (ref 80.0–100.0)
Platelets: 306 10*3/uL (ref 150–400)
RBC: 3.29 MIL/uL — ABNORMAL LOW (ref 4.22–5.81)
RDW: 18.9 % — ABNORMAL HIGH (ref 11.5–15.5)
WBC: 7.5 10*3/uL (ref 4.0–10.5)
nRBC: 0 % (ref 0.0–0.2)

## 2021-12-31 LAB — PHOSPHORUS
Phosphorus: 4.8 mg/dL — ABNORMAL HIGH (ref 2.5–4.6)
Phosphorus: 3.7 mg/dL (ref 2.5–4.6)

## 2021-12-31 MED ORDER — OSMOLITE 1.5 CAL PO LIQD
1000.0000 mL | ORAL | Status: DC
  Administered 2021-12-31 – 2022-01-01 (×3): 1000 mL
  Filled 2021-12-31 (×3): qty 1000

## 2021-12-31 MED ORDER — ADULT MULTIVITAMIN W/MINERALS CH
1.0000 | ORAL_TABLET | Freq: Every day | ORAL | Status: DC
  Administered 2021-12-31 – 2022-01-02 (×3): 1
  Filled 2021-12-31 (×3): qty 1

## 2021-12-31 MED ORDER — DEXTROSE 50 % IV SOLN
25.0000 g | INTRAVENOUS | Status: AC
  Administered 2021-12-31: 25 g via INTRAVENOUS

## 2021-12-31 MED ORDER — ORAL CARE MOUTH RINSE: 15.0000 mL | OROMUCOSAL | Status: DC | PRN

## 2021-12-31 MED ORDER — LIDOCAINE HCL URETHRAL/MUCOSAL 2 % EX GEL
1.0000 | Freq: Once | CUTANEOUS | Status: AC
  Administered 2021-12-31: 1 via URETHRAL
  Filled 2021-12-31: qty 6

## 2021-12-31 MED ORDER — DEXTROSE 50 % IV SOLN
1.0000 | Freq: Once | INTRAVENOUS | Status: AC
  Administered 2021-12-31: 50 mL via INTRAVENOUS

## 2021-12-31 MED ORDER — VANCOMYCIN HCL 1750 MG/350ML IV SOLN
1750.0000 mg | Freq: Once | INTRAVENOUS | Status: AC
  Administered 2021-12-31: 1750 mg via INTRAVENOUS
  Filled 2021-12-31: qty 350

## 2021-12-31 MED ORDER — PROSOURCE TF PO LIQD
45.0000 mL | Freq: Three times a day (TID) | ORAL | Status: DC
  Administered 2021-12-31 – 2022-01-03 (×8): 45 mL
  Filled 2021-12-31 (×8): qty 45

## 2021-12-31 MED ORDER — VANCOMYCIN HCL 1750 MG/350ML IV SOLN
1750.0000 mg | INTRAVENOUS | Status: DC
  Filled 2021-12-31: qty 350

## 2021-12-31 MED ORDER — CHLORHEXIDINE GLUCONATE CLOTH 2 % EX PADS
6.0000 | MEDICATED_PAD | Freq: Every day | CUTANEOUS | Status: DC
  Administered 2021-12-31 – 2022-01-03 (×4): 6 via TOPICAL

## 2021-12-31 MED ORDER — SODIUM CHLORIDE 0.9 % IV SOLN
2.0000 g | INTRAVENOUS | Status: DC
  Administered 2021-12-31: 2 g via INTRAVENOUS
  Filled 2021-12-31: qty 12.5

## 2021-12-31 MED ORDER — DEXTROSE 50 % IV SOLN
INTRAVENOUS | Status: AC
  Filled 2021-12-31: qty 50

## 2021-12-31 MED ORDER — VANCOMYCIN VARIABLE DOSE PER UNSTABLE RENAL FUNCTION (PHARMACIST DOSING): Status: DC

## 2021-12-31 MED ORDER — ACETAMINOPHEN 325 MG PO TABS
650.0000 mg | ORAL_TABLET | Freq: Four times a day (QID) | ORAL | Status: DC | PRN
  Administered 2022-01-01 (×3): 650 mg
  Filled 2021-12-31 (×3): qty 2

## 2021-12-31 MED ORDER — LIDOCAINE HCL URETHRAL/MUCOSAL 2 % EX GEL: 1.0000 | Freq: Once | CUTANEOUS | Status: DC

## 2021-12-31 NOTE — Progress Notes (Signed)
TF put on pause after measuring of cortrak is 20 cm difference from last measurement during day shift.

## 2021-12-31 NOTE — Progress Notes (Signed)
  Transition of Care [facility]) Screening Note   Patient Details  Name: Corey Meyer Date of Birth: April 25, 1951   Transition of Care [facility]) CM/SW Contact:    Mearl Latin, LCSW Phone Number: 12/31/2021, 4:38 PM    Transition of Care Department [facility]) has reviewed patient with ETOH withdrawal.   Completed [facility] 72 hour online notification, notification ID#: [id number].  We will continue to monitor patient advancement through interdisciplinary progression rounds. If new patient transition needs arise, please place a TOC consult.

## 2021-12-31 NOTE — Progress Notes (Signed)
LTM EEG hooked up and running - no initial skin breakdown - push button tested - neuro notified. [facility] monitoring.  

## 2021-12-31 NOTE — Progress Notes (Signed)
TF taken off pause d/t initial placement measurement being documented correctly before.

## 2021-12-31 NOTE — Progress Notes (Signed)
Initial Nutrition Assessment  DOCUMENTATION CODES:   Obesity unspecified  INTERVENTION:   Initiate tube feeding via Cortrak tube: Osmolite 1.5 at 25 ml/h and increase by 10 ml every 8 hours to goal rate of 55 ml/hr ml/h (1320 ml per day) Prosource TF 45 ml TID  Provides 2100 kcal, 115 gm protein, 1003 ml free water daily  Thiamine daily  MVI with minerals daily   Monitor magnesium and phosphorus every 12 hours x 4 occurrences, MD to replete as needed, as pt is at risk for refeeding syndrome given heavy ETOH abuse.   NUTRITION DIAGNOSIS:   Inadequate oral intake related to inability to eat as evidenced by NPO status.  GOAL:   Patient will meet greater than or equal to 90% of their needs  MONITOR:   Diet advancement, TF tolerance  REASON FOR ASSESSMENT:   Consult Enteral/tube feeding initiation and management  ASSESSMENT:   Pt with PMH of heavy substance abuse 1/5th vodka daily and 1/2 gallon on weekends, DM, and recently dx with sepsis and cardiac arrest who is now admitted metabolic encephalopathy and seizures.   Pt encephalopathic and unable to provide nutrition hx, no family present. Cortrak in place.    7/3 s/p cortrak; tip gastric   Medications reviewed and include: MVI with minerals, phenobarbital, thiamine  D10 1/2 NS @ 125 ml/hr   Labs reviewed: PO4: 4.8  NUTRITION - FOCUSED PHYSICAL EXAM:  Flowsheet Row Most Recent Value  Orbital Region No depletion  Upper Arm Region Mild depletion  Thoracic and Lumbar Region No depletion  Buccal Region No depletion  Temple Region No depletion  Clavicle Bone Region No depletion  Clavicle and Acromion Bone Region No depletion  Scapular Bone Region No depletion  Dorsal Hand Unable to assess  [confused, mittens due to cortrak]  Patellar Region No depletion  Anterior Thigh Region Mild depletion  Posterior Calf Region No depletion  Edema (RD Assessment) None  Hair Reviewed  Eyes Reviewed  Mouth Reviewed  Skin  Reviewed  Nails Unable to assess       Diet Order:   Diet Order             Diet NPO time specified  Diet effective now                   EDUCATION NEEDS:   Not appropriate for education at this time  Skin:  Skin Assessment: Reviewed RN Assessment  Last BM:  7/3  Height:   Ht Readings from Last 1 Encounters:  12/30/21 5\' 9"  (1.753 m)    Weight:   Wt Readings from Last 1 Encounters:  12/30/21 95 kg    BMI:  Body mass index is 30.93 kg/m.  Estimated Nutritional Needs:   Kcal:  2100-2300  Protein:  105-115 grams  Fluid:  >2 L/day  03/02/22., RD, LDN, CNSC See AMiON for contact information

## 2021-12-31 NOTE — Progress Notes (Addendum)
   NAME:  Corey Meyer, MRN:  [id number], DOB:  09-01-1950, LOS: 1 ADMISSION DATE:  12/30/2021, CONSULTATION DATE: 12/30/2021 REFERRING MD: [facility], CHIEF COMPLAINT: Alcohol withdrawal  History of Present Illness:  Corey Meyer is a 71 year old male with a history of heavy substance abuse alcohol drinks approximately 1/5 of either vodka or [place] club on a daily basis.  He was recently admitted for sepsis and had cardiac arrest.  He returns today with heavy EtOH abuse tremulous confusion [facility] hospitalist requested Precedex. We will transfer him to the intensive care unit continue CIWA protocol along with phenobarbital and possibly Precedex.   Pertinent  Medical History   Past Medical History:  Diagnosis Date   Controlled diabetes mellitus type II without complication (HCC)    Essential hypertension    Hypercholesteremia      Significant Hospital Events: Including procedures, antibiotic start and stop dates in addition to other pertinent events   12/30/2021 pulmonary critical care take over  Interim History / Subjective:  Persistent hypoglycemia. He remains confused.  Objective   Blood pressure (!) 155/101, pulse (!) 106, temperature 99.4 F (37.4 C), temperature source Oral, resp. rate 19, height 5\' 9"  (1.753 m), weight 95 kg, SpO2 100 %.        Intake/Output Summary (Last 24 hours) at 12/31/2021 0746 Last data filed at 12/31/2021 0600 Gross per 24 hour  Intake 1833.67 ml  Output 1050 ml  Net 783.67 ml   Filed Weights   12/30/21 1412  Weight: 95 kg    Examination: No distress Shaking L hand uncontrollably Follows commands but speech is unintelligible Falls back asleep easily during conversations Heart/lung exams benign Ext warm  Resolved Hospital Problem list     Assessment & Plan:  Multifactorial metabolic encephalopathy and seizures- related to hypoglycemia from antiglycemics + AKI, question Dts with postictal state. AKI on CKD 3a, bladder outlet obstruction, CAUTI  POA- check urine culture, change out foley to coude; talked with urology, just flush PRN for now, no benefit to CBD at present  - Continue IV supplemental glucose + crystalloid and close CBG monitoring - Renal 03/02/22 - Cortrak - Needs an EEG to assure he is not in subclinical status - f/u am labs, not drawn yet - Continue phenobarb taper with CIWA-driven PRN ativan on top, he is currently protecting airway  Best Practice (right click and "Reselect all SmartList Selections" daily)   Diet/type: NPO DVT prophylaxis: heparin GI prophylaxis: PPI Lines: N/A Foley:  N/A Code Status:  full code Last date of multidisciplinary goals of care discussion tbd  31 min cc time [place] MD PCCM

## 2021-12-31 NOTE — Procedures (Signed)
Cortrak  Person Inserting Tube:  Younes Degeorge D, RD Tube Type:  Cortrak - 43 inches Tube Size:  10 Tube Location:  Left nare Secured by: Bridle Technique Used to Measure Tube Placement:  Marking at nare/corner of mouth Cortrak Secured At:  64 cm  Cortrak Tube Team Note:  Consult received to place a Cortrak feeding tube.   X-ray is required, abdominal x-ray has been ordered by the Cortrak team. Please confirm tube placement before using the Cortrak tube.   If the tube becomes dislodged please keep the tube and contact the Cortrak team at [email or web address] ([email or web address]) for replacement.  If after hours and replacement cannot be delayed, place a NG tube and confirm placement with an abdominal x-ray.    Monica Codd, RD, LDN Clinical Dietitian RD pager # available in AMION  After hours/weekend pager # available in AMION 

## 2021-12-31 NOTE — Progress Notes (Signed)
eLink Physician-Brief Progress Note Patient Name: Corey Meyer DOB: 14-Oct-1950 MRN: [id number]   Date of Service  12/31/2021  HPI/Events of Note  Notified of persistent hypoglycemia despite D10.  eICU Interventions  Increase D10 1/2 NS to 125cc/hr.  Check POC glucose q1hr.     Intervention Category Intermediate Interventions: Other:  [name] 12/31/2021, 3:15 AM

## 2021-12-31 NOTE — Progress Notes (Addendum)
Pharmacy Antibiotic Note  Corey Meyer is a 71 y.o. male admitted on 12/30/2021 with sepsis.  Pharmacy has been consulted for Vanc and Cefepime dosing. Scr 2.93 (AKI on CKD 3)  Vancomycin 1750 mg IV Q 12 hrs. Goal AUC 400-550. Expected AUC: 522 SCr used: 2.92  Plan: Start Vancomycin 1750 q48hr Start Cefepime 2 gms IV q24hr Monitor renal function, C&S, and vanc levels as needed  ADDENDUM: Serum creatinine up to 3.7  Give Vancomycin 1750 IV x1, then pharmacy will dose based on levels given worsening renal function Continue Cefepime 2 gm IV q24hr  Height: 5\' 9"  (175.3 cm) Weight: 95 kg (209 lb 7 oz) IBW/kg (Calculated) : 70.7  Temp (24hrs), Avg:99.6 F (37.6 C), Min:98 F (36.7 C), Max:101.9 F (38.8 C)  Recent Labs  Lab 12/30/21 1400 12/31/21 0804  WBC 8.8 7.5  CREATININE 2.92*  --     Estimated Creatinine Clearance: 27.5 mL/min (A) (by C-G formula based on SCr of 2.92 mg/dL (H)).    Allergies  Allergen Reactions   Haldol [Haloperidol Lactate] Swelling and Other (See Comments)    Xerostomia    Antimicrobials this admission: Cefepime 7/3 >>  Vanc 7/3 >>   Thank you for allowing pharmacy to be a part of this patient's care.  03/03/22, PharmD, [facility] Clinical Pharmacist Please see AMION for all Pharmacists' Contact Phone Numbers 12/31/2021, 9:56 AM

## 2021-12-31 NOTE — Progress Notes (Signed)
eLink Physician-Brief Progress Note Patient Name: Corey Meyer DOB: 1950-12-09 MRN: [id number]   Date of Service  12/31/2021  HPI/Events of Note  Notified of glucoses in the 120s-150s.  Tube feeds at goal.  D10 at 125cc/hr.   eICU Interventions  Decrease D10 to 50cc/hr.       Intervention Category Intermediate Interventions: Other:  [name] 12/31/2021, 10:37 PM

## 2022-01-01 LAB — GLUCOSE, CAPILLARY
Glucose-Capillary: 104 mg/dL — ABNORMAL HIGH (ref 70–99)
Glucose-Capillary: 124 mg/dL — ABNORMAL HIGH (ref 70–99)
Glucose-Capillary: 127 mg/dL — ABNORMAL HIGH (ref 70–99)
Glucose-Capillary: 161 mg/dL — ABNORMAL HIGH (ref 70–99)
Glucose-Capillary: 316 mg/dL — ABNORMAL HIGH (ref 70–99)
Glucose-Capillary: 322 mg/dL — ABNORMAL HIGH (ref 70–99)
Glucose-Capillary: 348 mg/dL — ABNORMAL HIGH (ref 70–99)
Glucose-Capillary: 73 mg/dL (ref 70–99)

## 2022-01-01 LAB — HEMOGLOBIN A1C
Hgb A1c MFr Bld: 6.2 % — ABNORMAL HIGH (ref 4.8–5.6)
Mean Plasma Glucose: 131.24 mg/dL

## 2022-01-01 LAB — URINE CULTURE

## 2022-01-01 LAB — BASIC METABOLIC PANEL
Anion gap: 9 (ref 5–15)
BUN: 24 mg/dL — ABNORMAL HIGH (ref 8–23)
CO2: 20 mmol/L — ABNORMAL LOW (ref 22–32)
Calcium: 8.8 mg/dL — ABNORMAL LOW (ref 8.9–10.3)
Chloride: 111 mmol/L (ref 98–111)
Creatinine, Ser: 2.31 mg/dL — ABNORMAL HIGH (ref 0.61–1.24)
GFR, Estimated: 30 mL/min — ABNORMAL LOW (ref >60)
Glucose, Bld: 325 mg/dL — ABNORMAL HIGH (ref 70–99)
Potassium: 4 mmol/L (ref 3.5–5.1)
Sodium: 140 mmol/L (ref 135–145)

## 2022-01-01 LAB — CBC
HCT: 28.6 % — ABNORMAL LOW (ref 39.0–52.0)
Hemoglobin: 9 g/dL — ABNORMAL LOW (ref 13.0–17.0)
MCH: 29.7 pg (ref 26.0–34.0)
MCHC: 31.5 g/dL (ref 30.0–36.0)
MCV: 94.4 fL (ref 80.0–100.0)
Platelets: 293 10*3/uL (ref 150–400)
RBC: 3.03 MIL/uL — ABNORMAL LOW (ref 4.22–5.81)
RDW: 18.6 % — ABNORMAL HIGH (ref 11.5–15.5)
WBC: 8.9 10*3/uL (ref 4.0–10.5)
nRBC: 0 % (ref 0.0–0.2)

## 2022-01-01 LAB — PHOSPHORUS
Phosphorus: 3.2 mg/dL (ref 2.5–4.6)
Phosphorus: 2.9 mg/dL (ref 2.5–4.6)

## 2022-01-01 LAB — MAGNESIUM
Magnesium: 1.8 mg/dL (ref 1.7–2.4)
Magnesium: 1.8 mg/dL (ref 1.7–2.4)

## 2022-01-01 MED ORDER — PANTOPRAZOLE 2 MG/ML SUSPENSION
40.0000 mg | Freq: Every day | ORAL | Status: DC
  Administered 2022-01-01 – 2022-01-02 (×2): 40 mg
  Filled 2022-01-01 (×2): qty 20

## 2022-01-01 MED ORDER — METOPROLOL TARTRATE 25 MG PO TABS
25.0000 mg | ORAL_TABLET | Freq: Two times a day (BID) | ORAL | Status: DC
  Administered 2022-01-01 – 2022-01-02 (×3): 25 mg
  Filled 2022-01-01 (×3): qty 1

## 2022-01-01 MED ORDER — VANCOMYCIN HCL 750 MG/150ML IV SOLN
750.0000 mg | INTRAVENOUS | Status: DC
  Administered 2022-01-01 – 2022-01-02 (×2): 750 mg via INTRAVENOUS
  Filled 2022-01-01 (×2): qty 150

## 2022-01-01 MED ORDER — CEFEPIME HCL 2 G IV SOLR
2.0000 g | Freq: Two times a day (BID) | INTRAVENOUS | Status: DC
  Administered 2022-01-01 – 2022-01-02 (×3): 2 g via INTRAVENOUS
  Filled 2022-01-01 (×3): qty 12.5

## 2022-01-01 MED ORDER — INSULIN ASPART 100 UNIT/ML IJ SOLN: 0.0000 [IU] | INTRAMUSCULAR | Status: DC

## 2022-01-01 MED ORDER — THIAMINE HCL 100 MG PO TABS
100.0000 mg | ORAL_TABLET | Freq: Every day | ORAL | Status: DC
  Administered 2022-01-01 – 2022-01-02 (×2): 100 mg
  Filled 2022-01-01 (×2): qty 1

## 2022-01-01 MED ORDER — FOLIC ACID 1 MG PO TABS
1.0000 mg | ORAL_TABLET | Freq: Every day | ORAL | Status: DC
  Administered 2022-01-01 – 2022-01-02 (×2): 1 mg
  Filled 2022-01-01 (×2): qty 1

## 2022-01-01 MED ORDER — INSULIN ASPART 100 UNIT/ML IJ SOLN
1.0000 [IU] | INTRAMUSCULAR | Status: DC
  Administered 2022-01-01: 3 [IU] via SUBCUTANEOUS

## 2022-01-01 MED ORDER — MONTELUKAST SODIUM 10 MG PO TABS
10.0000 mg | ORAL_TABLET | Freq: Every day | ORAL | Status: DC
  Administered 2022-01-01: 10 mg
  Filled 2022-01-01 (×2): qty 1

## 2022-01-01 MED ORDER — INSULIN DETEMIR 100 UNIT/ML ~~LOC~~ SOLN
10.0000 [IU] | Freq: Two times a day (BID) | SUBCUTANEOUS | Status: DC
  Administered 2022-01-01 – 2022-01-03 (×6): 10 [IU] via SUBCUTANEOUS
  Filled 2022-01-01 (×10): qty 0.1

## 2022-01-01 MED ORDER — METOPROLOL TARTRATE 25 MG PO TABS: 25.0000 mg | ORAL_TABLET | Freq: Two times a day (BID) | ORAL | Status: DC

## 2022-01-01 MED ORDER — INSULIN ASPART 100 UNIT/ML IJ SOLN
0.0000 [IU] | INTRAMUSCULAR | Status: DC
  Administered 2022-01-01: 2 [IU] via SUBCUTANEOUS
  Administered 2022-01-01: 3 [IU] via SUBCUTANEOUS
  Administered 2022-01-01: 11 [IU] via SUBCUTANEOUS
  Administered 2022-01-02: 5 [IU] via SUBCUTANEOUS
  Administered 2022-01-02: 3 [IU] via SUBCUTANEOUS
  Administered 2022-01-02: 8 [IU] via SUBCUTANEOUS
  Administered 2022-01-02: 3 [IU] via SUBCUTANEOUS
  Administered 2022-01-02: 2 [IU] via SUBCUTANEOUS
  Administered 2022-01-03: 3 [IU] via SUBCUTANEOUS
  Administered 2022-01-03: 11 [IU] via SUBCUTANEOUS
  Administered 2022-01-03: 3 [IU] via SUBCUTANEOUS
  Administered 2022-01-03: 2 [IU] via SUBCUTANEOUS
  Administered 2022-01-04 (×2): 5 [IU] via SUBCUTANEOUS

## 2022-01-01 NOTE — Progress Notes (Signed)
   NAME:  Corey Meyer, MRN:  [id number], DOB:  Jun 21, 1951, LOS: 2 ADMISSION DATE:  12/30/2021, CONSULTATION DATE: 12/30/2021 REFERRING MD: [facility], CHIEF COMPLAINT: Alcohol withdrawal  History of Present Illness:  Corey Meyer is a 71 year old male with a history of heavy substance abuse alcohol drinks approximately 1/5 of either vodka or [place] club on a daily basis.  He was recently admitted for sepsis and had cardiac arrest.  He returns today with heavy EtOH abuse tremulous confusion [facility] hospitalist requested Precedex. We will transfer him to the intensive care unit continue CIWA protocol along with phenobarbital and possibly Precedex.   Pertinent  Medical History   Past Medical History:  Diagnosis Date   Controlled diabetes mellitus type II without complication (HCC)    Essential hypertension    Hypercholesteremia      Significant Hospital Events: Including procedures, antibiotic start and stop dates in addition to other pertinent events   12/30/2021 pulmonary critical care take over  Interim History / Subjective:  Now hyperglycemic, more responsive, making great urine, still febrile.  Objective   Blood pressure (!) 145/101, pulse 99, temperature (!) 101.1 F (38.4 C), temperature source Axillary, resp. rate 16, height 5\' 9"  (1.753 m), weight 92.8 kg, SpO2 100 %.        Intake/Output Summary (Last 24 hours) at 01/01/2022 1059 Last data filed at 01/01/2022 0800 Gross per 24 hour  Intake 2995.18 ml  Output 7475 ml  Net -4479.82 ml    Filed Weights   12/30/21 1412 01/01/22 0400  Weight: 95 kg 92.8 kg    Examination: No distress L hand shaking improved Follows commands, speech more fluent today More awake, asking for food Heart/lung exams benign Ext warm Foley now with clear urine  Cr improving with functional foley CBC looks good Urine cx pending  Resolved Hospital Problem list     Assessment & Plan:  Multifactorial metabolic encephalopathy and seizures-  related to hypoglycemia from antiglycemics + AKI, question Dts with postictal state + sepsis; improving slowly AKI on CKD 3a, bladder outlet obstruction, CAUTI POA- improved with foley change and flushes Dm2 with hyperglycemia  - SLP eval - Phenobarb taper with PRN benzos as ordered - Abx, f/u urine culture data - DC ltveeg - Continue foley flushes, needs OP urology f/u - PT/OT - Start levemir + SSI - stable for transfer out of ICU, appreciate [facility] taking over 7/5 for ongoing care  9/5 MD PCCM

## 2022-01-01 NOTE — Plan of Care (Signed)
  Problem: Education: Goal: Knowledge of General Education information will improve Description: Including pain rating scale, medication(s)/side effects and non-pharmacologic comfort measures Outcome: Progressing   Problem: Health Behavior/Discharge Planning: Goal: Ability to manage health-related needs will improve Outcome: Progressing   Problem: Clinical Measurements: Goal: Ability to maintain clinical measurements within normal limits will improve Outcome: Progressing   Problem: Nutrition: Goal: Adequate nutrition will be maintained Outcome: Progressing   Problem: Coping: Goal: Level of anxiety will decrease Outcome: Progressing   Problem: Elimination: Goal: Will not experience complications related to bowel motility Outcome: Progressing Goal: Will not experience complications related to urinary retention Outcome: Progressing   Problem: Education: Goal: Knowledge of disease or condition will improve Outcome: Progressing Goal: Understanding of discharge needs will improve Outcome: Progressing   Problem: Health Behavior/Discharge Planning: Goal: Ability to identify changes in lifestyle to reduce recurrence of condition will improve Outcome: Progressing Goal: Identification of resources available to assist in meeting health care needs will improve Outcome: Progressing   Problem: Physical Regulation: Goal: Complications related to the disease process, condition or treatment will be avoided or minimized Outcome: Progressing   Problem: Safety: Goal: Ability to remain free from injury will improve Outcome: Progressing

## 2022-01-01 NOTE — Procedures (Signed)
Patient Name: Corey Meyer  MRN: [id number]  Epilepsy Attending: Charlsie Quest  Referring Physician/Provider: Lorin Glass, MD  Duration: 01/01/2022 0926 to 01/01/2022 1314   Patient history: 71 year old male with altered mental status.  EEG to evaluate for seizure.   Level of alertness: Awake, asleep   AEDs during EEG study: Phenobarb   Technical aspects: This EEG study was done with scalp electrodes positioned according to the 10-20 International system of electrode placement. Electrical activity was acquired at a sampling rate of 500Hz  and reviewed with a high frequency filter of 70Hz  and a low frequency filter of 1Hz . EEG data were recorded continuously and digitally stored.    Description: The posterior dominant rhythm consists of 9-10 Hz activity of moderate voltage (25-35 uV) seen predominantly in posterior head regions, symmetric and reactive to eye opening and eye closing. Sleep was characterized by vertex waves, sleep spindles (12 to 14 Hz), maximal frontocentral region. Hyperventilation and photic stimulation were not performed.      IMPRESSION: This study is within normal limits. No seizures or epileptiform discharges were seen throughout the recording.    Darcey Cardy 

## 2022-01-01 NOTE — Progress Notes (Addendum)
eLink Physician-Brief Progress Note Patient Name: Corey Meyer DOB: 07-01-1951 MRN: [id number]   Date of Service  01/01/2022  HPI/Events of Note  Notified of hyperglycemia with glucose now at 322.  Pt also hypertensive at 136/102, HR 100s.   Pt is on amlodipine and metoprolol at home.   eICU Interventions  Discontinue D10.  Start on sensitive insulin sliding scale given hypoglycemia the day prior. Restart low dose metoprolol via tube.      Intervention Category Intermediate Interventions: Hyperglycemia - evaluation and treatment;Hypertension - evaluation and management  [name] 01/01/2022, 3:51 AM

## 2022-01-01 NOTE — Evaluation (Signed)
Clinical/Bedside Swallow Evaluation Patient Details  Name: Corey Meyer MRN: [id number] Date of Birth: [birth year]  Today's Date: 01/01/2022 Time: SLP Start Time (ACUTE ONLY): 1003 SLP Stop Time (ACUTE ONLY): 1022 SLP Time Calculation (min) (ACUTE ONLY): 19 min  Past Medical History:  Past Medical History:  Diagnosis Date   Controlled diabetes mellitus type II without complication (HCC)    Essential hypertension    Hypercholesteremia    Past Surgical History:  Past Surgical History:  Procedure Laterality Date   LEFT HEART CATH AND CORONARY ANGIOGRAPHY N/A 12/25/2018   Procedure: LEFT HEART CATH AND CORONARY ANGIOGRAPHY;  Surgeon: Yates Decamp, MD;  Location: [facility];  Service: Cardiovascular;  Laterality: N/A;   HPI:  Pt is a 71 yo male, recently admitted for sepsis and had cardiac arrest, who presents 7/2 in the setting of heavy alcohol use with metabolic encephalopathy and seizures. PMH includes: EtOH abuse (1/5 of liquor most days, polysubstance abuse (crack cocaine, meth) gallon a day on the weekend), DM, HTN, afib, CKD, HLD    Assessment / Plan / Recommendation  Clinical Impression  Pt initially presented with functional appearing oropharyngeal swallowing although needed cues throughout testing to attend to boluses and not talk with food in his mouth. Upon return to liquids after attempting solids, pt had a strong, explosive cough response immediately after swallowing, concerning for inadequate airway protection with mentation felt to be contributing. Although anticipate that he will be able to return to PO diet, would proceed cautiously in light of current mentation as well as likely fluctuations that could put him at increased risk for even episodic aspiration. Would offer sips of water after oral care when pt is fully alert and cooperative. Pt does have cortrak for now, but could consider meds in puree if access is lost. SLP Visit Diagnosis: Dysphagia, unspecified (R13.10)     Aspiration Risk  Moderate aspiration risk    Diet Recommendation Free water protocol after oral care (as mentation allows)   Liquid Administration via: Cup Medication Administration: Via alternative means (could offer in puree if pt were to lose cortrak) Supervision: Staff to assist with self feeding Compensations: Minimize environmental distractions;Slow rate;Small sips/bites Postural Changes: Seated upright at 90 degrees;Remain upright for at least 30 minutes after po intake    Other  Recommendations Oral Care Recommendations: Oral care QID;Oral care prior to ice chip/H20 Other Recommendations: Have oral suction available    Recommendations for follow up therapy are one component of a multi-disciplinary discharge planning process, led by the attending physician.  Recommendations may be updated based on patient status, additional functional criteria and insurance authorization.  Follow up Recommendations  (tba - none anticipated for swallowing)      Assistance Recommended at Discharge Intermittent Supervision/Assistance  Functional Status Assessment Patient has had a recent decline in their functional status and demonstrates the ability to make significant improvements in function in a reasonable and predictable amount of time.  Frequency and Duration min 2x/week  2 weeks       Prognosis Prognosis for Safe Diet Advancement: Good Barriers to Reach Goals: Cognitive deficits      Swallow Study   General HPI: Pt is a 71 yo male, recently admitted for sepsis and had cardiac arrest, who presents 7/2 in the setting of heavy alcohol use with metabolic encephalopathy and seizures. PMH includes: EtOH abuse (1/5 of liquor most days, polysubstance abuse (crack cocaine, meth) gallon a day on the weekend), DM, HTN, afib, CKD, HLD Type of Study:  Bedside Swallow Evaluation Previous Swallow Assessment: none in chart Diet Prior to this Study: NPO;NG Tube Temperature Spikes Noted: Yes  (100.6) Respiratory Status: Room air History of Recent Intubation: No Behavior/Cognition: Alert;Cooperative;Impulsive;Distractible;Requires cueing Oral Cavity Assessment: Within Functional Limits Oral Care Completed by SLP: No Oral Cavity - Dentition: Edentulous Vision: Functional for self-feeding Self-Feeding Abilities: Needs assist Patient Positioning: Upright in bed Baseline Vocal Quality: Normal Volitional Cough: Weak Volitional Swallow: Able to elicit    Oral/Motor/Sensory Function Overall Oral Motor/Sensory Function: Within functional limits (doesn't follow all commands, but what can be observed is [facility])   [place] chips: Not tested   Thin Liquid Thin Liquid: Impaired Presentation: Cup;Straw;Spoon Pharyngeal  Phase Impairments: Cough - Immediate    Nectar Thick Nectar Thick Liquid: Not tested   Honey Thick Honey Thick Liquid: Not tested   Puree Puree: Within functional limits Presentation: Spoon   Solid     Solid: Within functional limits      Mahala Menghini., M.A. CCC-SLP Acute Rehabilitation Services Office [phone]  Secure chat preferred  01/01/2022,10:42 AM

## 2022-01-01 NOTE — Procedures (Addendum)
Patient Name: Billy Turvey  MRN: [id number]  Epilepsy Attending: Charlsie Quest  Referring Physician/Provider: Lorin Glass, MD  Duration: 12/31/2021 4469 to 01/01/2022 5072  Patient history: 71 year old male with altered mental status.  EEG to evaluate for seizure.  Level of alertness: Awake, asleep  AEDs during EEG study: Phenobarb  Technical aspects: This EEG study was done with scalp electrodes positioned according to the 10-20 International system of electrode placement. Electrical activity was acquired at a sampling rate of 500Hz  and reviewed with a high frequency filter of 70Hz  and a low frequency filter of 1Hz . EEG data were recorded continuously and digitally stored.   Description: The posterior dominant rhythm consists of 9-10 Hz activity of moderate voltage (25-35 uV) seen predominantly in posterior head regions, symmetric and reactive to eye opening and eye closing. Sleep was characterized by vertex waves, sleep spindles (12 to 14 Hz), maximal frontocentral region. There is an excessive amount of 15 to 18 Hz beta activity distributed symmetrically and diffusely.   Patient was noted to have intermittent episodes of left and right upper extremity tremor-like movements. Concomitant EEG before, during and after the event did not show any EEG changes suggest seizure.  Hyperventilation and photic stimulation were not performed.     ABNORMALITY - Excessive beta, generalized  IMPRESSION: This study is within normal limits. The excessive beta activity seen in the background is most likely due to the effect of benzodiazepine and is a benign EEG pattern. No seizures or epileptiform discharges were seen throughout the recording.  Patient was noted to have intermittent episodes of left and right upper extremity tremor-like movements without concomitant EEG change.  These episodes were NOT epileptic.   Maleena Eddleman 

## 2022-01-01 NOTE — Progress Notes (Signed)
LTM maint complete - no skin breakdown [facility] monitored, Event button test confirmed by [facility]. ? ?

## 2022-01-01 NOTE — Progress Notes (Signed)
LTM EEG disconnected - no skin breakdown at unhook.  

## 2022-01-01 NOTE — Plan of Care (Signed)
  Problem: Education: Goal: Knowledge of General Education information will improve Description: Including pain rating scale, medication(s)/side effects and non-pharmacologic comfort measures Outcome: Progressing   Problem: Health Behavior/Discharge Planning: Goal: Ability to manage health-related needs will improve Outcome: Progressing   Problem: Clinical Measurements: Goal: Will remain free from infection Outcome: Progressing   Problem: Activity: Goal: Risk for activity intolerance will decrease Outcome: Progressing   Problem: Nutrition: Goal: Adequate nutrition will be maintained Outcome: Progressing   Problem: Elimination: Goal: Will not experience complications related to urinary retention Outcome: Progressing   Problem: Education: Goal: Knowledge of disease or condition will improve Outcome: Progressing Goal: Understanding of discharge needs will improve Outcome: Progressing   Problem: Safety: Goal: Non-violent Restraint(s) Outcome: Progressing

## 2022-01-01 NOTE — Progress Notes (Signed)
Pharmacy Antibiotic Note  Corey Meyer is a 71 y.o. male admitted on 12/30/2021 with sepsis.  Pharmacy has been consulted for Vanc and Cefepime dosing.    Renal function is improving, Tmax 101.1, WBC WNL.  Plan: Cancel vancomycin random level Schedule vanc 750mg  IV Q24H for AUC 482 using SCr 2.31 Increase cefepime to 2gm IV Q12H Monitor renal fxn, clinical progress, vanc levels as indicated  Height: 5\' 9"  (175.3 cm) Weight: 92.8 kg (204 lb 9.4 oz) IBW/kg (Calculated) : 70.7  Temp (24hrs), Avg:99.4 F (37.4 C), Min:97.9 F (36.6 C), Max:101.1 F (38.4 C)  Recent Labs  Lab 12/30/21 1400 12/31/21 0804 01/01/22 0303  WBC 8.8 7.5 8.9  CREATININE 2.92* 3.70* 2.31*     Estimated Creatinine Clearance: 34.4 mL/min (A) (by C-G formula based on SCr of 2.31 mg/dL (H)).    Allergies  Allergen Reactions   Haldol [Haloperidol Lactate] Swelling and Other (See Comments)    Xerostomia   Vanc 7/3 >> CTX x1 7/2 Cefepime 7/3 >>   7/2 BCx - NGTD 7/3 UCx -   Tasheba Henson D. 9/2, PharmD, BCPS, BCCCP 01/01/2022, 8:38 AM

## 2022-01-01 NOTE — Progress Notes (Signed)
MB Performed Maint. On A1, A2, and FP1 electrodes. All impedances below 10k ohms. No skin breakdown to note at this time.

## 2022-01-01 NOTE — Progress Notes (Signed)
Pt w/ D10-.45 NS on board along w/ conti TF. Elink informed and CBG monitored. Pt also w/ some elevated dBPs, which a low dose beta blocker added.

## 2022-01-02 LAB — BASIC METABOLIC PANEL
Anion gap: 7 (ref 5–15)
BUN: 19 mg/dL (ref 8–23)
CO2: 20 mmol/L — ABNORMAL LOW (ref 22–32)
Calcium: 8.8 mg/dL — ABNORMAL LOW (ref 8.9–10.3)
Chloride: 109 mmol/L (ref 98–111)
Creatinine, Ser: 1.36 mg/dL — ABNORMAL HIGH (ref 0.61–1.24)
GFR, Estimated: 57 mL/min — ABNORMAL LOW (ref >60)
Glucose, Bld: 233 mg/dL — ABNORMAL HIGH (ref 70–99)
Potassium: 3.5 mmol/L (ref 3.5–5.1)
Sodium: 136 mmol/L (ref 135–145)

## 2022-01-02 LAB — MAGNESIUM: Magnesium: 1.7 mg/dL (ref 1.7–2.4)

## 2022-01-02 LAB — GLUCOSE, CAPILLARY
Glucose-Capillary: 213 mg/dL — ABNORMAL HIGH (ref 70–99)
Glucose-Capillary: 150 mg/dL — ABNORMAL HIGH (ref 70–99)
Glucose-Capillary: 175 mg/dL — ABNORMAL HIGH (ref 70–99)
Glucose-Capillary: 264 mg/dL — ABNORMAL HIGH (ref 70–99)
Glucose-Capillary: 180 mg/dL — ABNORMAL HIGH (ref 70–99)

## 2022-01-02 LAB — CBC
HCT: 28.6 % — ABNORMAL LOW (ref 39.0–52.0)
Hemoglobin: 9.2 g/dL — ABNORMAL LOW (ref 13.0–17.0)
MCH: 29.5 pg (ref 26.0–34.0)
MCHC: 32.2 g/dL (ref 30.0–36.0)
MCV: 91.7 fL (ref 80.0–100.0)
Platelets: 300 10*3/uL (ref 150–400)
RBC: 3.12 MIL/uL — ABNORMAL LOW (ref 4.22–5.81)
RDW: 17.8 % — ABNORMAL HIGH (ref 11.5–15.5)
WBC: 9.3 10*3/uL (ref 4.0–10.5)
nRBC: 0 % (ref 0.0–0.2)

## 2022-01-02 LAB — PHOSPHORUS: Phosphorus: 2.6 mg/dL (ref 2.5–4.6)

## 2022-01-02 MED ORDER — FOLIC ACID 1 MG PO TABS
1.0000 mg | ORAL_TABLET | Freq: Every day | ORAL | Status: DC
  Administered 2022-01-03 – 2022-01-04 (×2): 1 mg via ORAL
  Filled 2022-01-02 (×2): qty 1

## 2022-01-02 MED ORDER — PANTOPRAZOLE 2 MG/ML SUSPENSION
40.0000 mg | Freq: Every day | ORAL | Status: DC
  Administered 2022-01-03 – 2022-01-04 (×2): 40 mg via ORAL
  Filled 2022-01-02 (×2): qty 20

## 2022-01-02 MED ORDER — ACETAMINOPHEN 325 MG PO TABS: 650.0000 mg | ORAL_TABLET | Freq: Four times a day (QID) | ORAL | Status: DC | PRN

## 2022-01-02 MED ORDER — METOPROLOL TARTRATE 25 MG PO TABS
25.0000 mg | ORAL_TABLET | Freq: Two times a day (BID) | ORAL | Status: DC
  Administered 2022-01-02 – 2022-01-04 (×4): 25 mg via ORAL
  Filled 2022-01-02 (×4): qty 1

## 2022-01-02 MED ORDER — MONTELUKAST SODIUM 10 MG PO TABS
10.0000 mg | ORAL_TABLET | Freq: Every day | ORAL | Status: DC
  Administered 2022-01-02 – 2022-01-03 (×2): 10 mg via ORAL
  Filled 2022-01-02 (×2): qty 1

## 2022-01-02 MED ORDER — SODIUM CHLORIDE 0.9 % IV SOLN
2.0000 g | INTRAVENOUS | Status: DC
  Administered 2022-01-03 – 2022-01-04 (×2): 2 g via INTRAVENOUS
  Filled 2022-01-02 (×2): qty 20

## 2022-01-02 MED ORDER — ADULT MULTIVITAMIN W/MINERALS CH
1.0000 | ORAL_TABLET | Freq: Every day | ORAL | Status: DC
  Administered 2022-01-03 – 2022-01-04 (×2): 1 via ORAL
  Filled 2022-01-02 (×2): qty 1

## 2022-01-02 MED ORDER — THIAMINE HCL 100 MG PO TABS
100.0000 mg | ORAL_TABLET | Freq: Every day | ORAL | Status: DC
  Administered 2022-01-03 – 2022-01-04 (×2): 100 mg via ORAL
  Filled 2022-01-02 (×2): qty 1

## 2022-01-02 NOTE — Progress Notes (Signed)
PROGRESS NOTE        PATIENT DETAILS Name: Corey Meyer Age: 71 y.o. Sex: male Date of Birth: [birth year] Admit Date: 12/30/2021 Admitting Physician Martina Sinner, MD [id number], Lenn Sink  Brief Summary: Patient is a 71 y.o.  male with a history of polysubstance abuse (alcohol, cocaine, methamphetamine)-recent cardiac arrest in the setting of polysubstance use (May 2023) chronic indwelling Foley catheter,, DM-2, HTN, CKD stage IIIa, HLD, A-fib-who presented with acute encephalopathy in the setting of alcohol withdrawal, hypoglycemia and seizures.   Significant events: 7/2>> admit to [facility] for AMS-alcohol withdrawal symptoms-hypoglycemia 7/2>> transferred to PCCM   Significant studies: 7/2>> CXR: No PNA 7/2>> CT head: No acute intracranial abnormality 7/2>> renal ultrasound: Bilateral hydroureteronephrosis-worse on right-likely due to bladder outlet obstruction. 7/3-7/4>> LTM EEG: No seizures   Significant microbiology data: 7/2>> blood culture: No growth 7/3>> urine culture: Multiple species present.  Procedures:   Consults: PCCM  Subjective: Lying comfortably in bed-denies any chest pain or shortness of breath.  Objective: Vitals: Blood pressure (!) 136/100, pulse 93, temperature 99.3 F (37.4 C), temperature source Oral, resp. rate 16, height 5\' 9"  (1.753 m), weight 92.8 kg, SpO2 100 %.   Exam: Gen Exam: Awake-somewhat confused but answering some questions appropriately-not in any distress. HEENT:atraumatic, normocephalic Chest: B/L clear to auscultation anteriorly CVS:S1S2 regular Abdomen:soft non tender, non distended Extremities:no edema Neurology: Non focal Skin: no rash  Pertinent Labs/Radiology:    Latest Ref Rng & Units 01/02/2022    5:01 AM 01/01/2022    3:03 AM 12/31/2021    8:04 AM  CBC  WBC 4.0 - 10.5 K/uL 9.3  8.9  7.5   Hemoglobin 13.0 - 17.0 g/dL 9.2  9.0  9.6   Hematocrit 39.0 - 52.0 % 28.6  28.6  30.7    Platelets 150 - 400 K/uL 300  293  306     Lab Results  Component Value Date   NA 136 01/02/2022   K 3.5 01/02/2022   CL 109 01/02/2022   CO2 20 (L) 01/02/2022      Assessment/Plan: Acute metabolic encephalopathy: Due to combination of alcohol withdrawal, hypoglycemia, AKI, and postictal state.  Continue supportive care-continue treat underlying etiologies-encephalopathy slowly improving.  We will attempt to slowly remove restraints-mobilize over the next few days.  Continue NG tube feedings-await SLP eval to see if he is ready for oral intake.  Alcohol withdrawal: Improving-continue tapering phenobarb and as needed benzos  Seizures with postictal state: Had a witnessed seizure with EMS while in route to the hospital-in the setting of alcohol withdrawal/hypoglycemia-LTM EEG/CT head stable-on tapering phenobarb-does not require long-term AEDs.  AKI on CKD stage IIIa: AKI likely due to obstructive uropathy-creatinine improved and now close to baseline.  History of urinary retention: Foley catheter present prior to this hospitalization-on Flomax.  Will need outpatient urology follow-up.  Sepsis due to complicated UTI: Likely related to Foley catheter use-stop vancomycin/cefepime-although urine cultures negative-given clinical presentation-we will treat with antibiotics x1 week total-transition to IV Rocephin.  Hypoglycemia: Resolved-likely due to oral hypoglycemic use/drug use-stop glipizide on discharge.  History of PAF: Containing sinus rhythm-continue metoprolol-only on Eliquis as an outpatient-given his drug history-not a good candidate-we will discuss with patient when he is more awake and alert regarding how compliant he is.  History of cardiac arrest: In the setting of drug use-this occurred in May 2023-who was  hospitalized at [facility].  Chronic systolic heart failure: Volume status stable-follow closely.  HTN: BP stable-continue metoprolol  DM-2: CBG stable  with 10 units Levemir twice daily and SSI.  Recent Labs    01/01/22 2346 01/02/22 0425 01/02/22 0818  GLUCAP 124* 213* 150*    Polysubstance abuse(Alcohol/cocaine/methamphetamine): We will need counseling when more awake and alert.  Nutrition Status: Nutrition Problem: Inadequate oral intake Etiology: inability to eat Signs/Symptoms: NPO status Interventions: Tube feeding, Prostat, MVI  Obesity Estimated body mass index is 30.21 kg/m as calculated from the following:   Height as of this encounter: 5\' 9"  (1.753 m).   Weight as of this encounter: 92.8 kg.   Code status:   Code Status: Full Code   DVT Prophylaxis: heparin injection 5,000 Units Start: 12/30/21 1730   Family Communication: None at bedside  Disposition Plan: Status is: Inpatient Remains inpatient appropriate because: Not stable for discharge-resolving encephalopathy   Planned Discharge Destination:Home   Diet: Diet Order             DIET DYS 3 Room service appropriate? Yes; Fluid consistency: Thin  Diet effective now                     Antimicrobial agents: Anti-infectives (From admission, onward)    Start     Dose/Rate Route Frequency Ordered Stop   01/01/22 1200  vancomycin (VANCOREADY) IVPB 750 mg/150 mL        750 mg 150 mL/hr over 60 Minutes Intravenous Every 24 hours 01/01/22 0840     01/01/22 1000  ceFEPIme (MAXIPIME) 2 g in sodium chloride 0.9 % 100 mL IVPB        2 g 200 mL/hr over 30 Minutes Intravenous Every 12 hours 01/01/22 0834     12/31/21 1100  vancomycin (VANCOREADY) IVPB 1750 mg/350 mL        1,750 mg 175 mL/hr over 120 Minutes Intravenous  Once 12/31/21 1000 12/31/21 1415   12/31/21 1045  ceFEPIme (MAXIPIME) 2 g in sodium chloride 0.9 % 100 mL IVPB  Status:  Discontinued        2 g 200 mL/hr over 30 Minutes Intravenous Every 24 hours 12/31/21 0949 01/01/22 0834   12/31/21 1045  vancomycin (VANCOREADY) IVPB 1750 mg/350 mL  Status:  Discontinued        1,750 mg 175  mL/hr over 120 Minutes Intravenous Every 48 hours 12/31/21 0953 12/31/21 1000   12/31/21 0959  vancomycin variable dose per unstable renal function (pharmacist dosing)  Status:  Discontinued         Does not apply See admin instructions 12/31/21 1000 01/01/22 0840   12/30/21 2100  cefTRIAXone (ROCEPHIN) 1 g in sodium chloride 0.9 % 100 mL IVPB  Status:  Discontinued        1 g 200 mL/hr over 30 Minutes Intravenous Every 24 hours 12/30/21 2013 12/31/21 0949        MEDICATIONS: Scheduled Meds:  Chlorhexidine Gluconate Cloth  6 each Topical Daily   feeding supplement (PROSource TF)  45 mL Per Tube TID   folic acid  1 mg Per Tube Daily   heparin  5,000 Units Subcutaneous Q8H   insulin aspart  0-15 Units Subcutaneous Q4H   insulin detemir  10 Units Subcutaneous BID   metoprolol tartrate  25 mg Per Tube BID   montelukast  10 mg Per Tube QHS   multivitamin with minerals  1 tablet Per Tube Daily   pantoprazole sodium  40 mg Per Tube Daily   PHENObarbital  65 mg Intravenous Q8H   Followed by   Derrill Memo ON 01/03/2022] PHENObarbital  32.5 mg Intravenous Q8H   sodium chloride flush  3 mL Intravenous Q12H   thiamine  100 mg Per Tube Daily   Continuous Infusions:  sodium chloride     ceFEPime (MAXIPIME) IV 2 g (01/02/22 0829)   feeding supplement (OSMOLITE 1.5 CAL) 55 mL/hr at 01/02/22 0600   vancomycin 750 mg (01/02/22 1144)   PRN Meds:.sodium chloride, acetaminophen, docusate sodium, LORazepam, mouth rinse, polyethylene glycol, sodium chloride flush   I have personally reviewed following labs and imaging studies  LABORATORY DATA: CBC: Recent Labs  Lab 12/30/21 1400 12/31/21 0804 01/01/22 0303 01/02/22 0501  WBC 8.8 7.5 8.9 9.3  NEUTROABS 7.3  --   --   --   HGB 10.1* 9.6* 9.0* 9.2*  HCT 32.7* 30.7* 28.6* 28.6*  MCV 94.8 93.3 94.4 91.7  PLT 325 306 293 [id number]    Basic Metabolic Panel: Recent Labs  Lab 12/30/21 1400 12/30/21 1719 12/31/21 0804 12/31/21 1709 01/01/22 0303  01/01/22 1550 01/02/22 0501  NA 139  --  137  --  140  --  136  K 4.1  --  4.2  --  4.0  --  3.5  CL 109  --  109  --  111  --  109  CO2 17*  --  21*  --  20*  --  20*  GLUCOSE 86  --  83  --  325*  --  233*  BUN 28*  --  33*  --  24*  --  19  CREATININE 2.92*  --  3.70*  --  2.31*  --  1.36*  CALCIUM 9.0  --  9.2  --  8.8*  --  8.8*  MG  --    < > 2.1 1.8 1.8 1.8 1.7  PHOS  --    < > 4.8* 3.7 3.2 2.9 2.6   < > = values in this interval not displayed.    GFR: Estimated Creatinine Clearance: 58.5 mL/min (A) (by C-G formula based on SCr of 1.36 mg/dL (H)).  Liver Function Tests: Recent Labs  Lab 12/30/21 1400  AST 20  ALT 10  ALKPHOS 108  BILITOT 0.5  PROT 7.5  ALBUMIN 3.4*   No results for input(s): "LIPASE", "AMYLASE" in the last 168 hours. No results for input(s): "AMMONIA" in the last 168 hours.  Coagulation Profile: Recent Labs  Lab 12/30/21 1719  INR 1.0    Cardiac Enzymes: No results for input(s): "CKTOTAL", "CKMB", "CKMBINDEX", "TROPONINI" in the last 168 hours.  BNP (last 3 results) No results for input(s): "PROBNP" in the last 8760 hours.  Lipid Profile: No results for input(s): "CHOL", "HDL", "LDLCALC", "TRIG", "CHOLHDL", "LDLDIRECT" in the last 72 hours.  Thyroid Function Tests: No results for input(s): "TSH", "T4TOTAL", "FREET4", "T3FREE", "THYROIDAB" in the last 72 hours.  Anemia Panel: No results for input(s): "VITAMINB12", "FOLATE", "FERRITIN", "TIBC", "IRON", "RETICCTPCT" in the last 72 hours.  Urine analysis:    Component Value Date/Time   COLORURINE AMBER (A) 12/30/2021 1400   APPEARANCEUR TURBID (A) 12/30/2021 1400   LABSPEC 1.010 12/30/2021 1400   PHURINE 6.0 12/30/2021 1400   GLUCOSEU 50 (A) 12/30/2021 1400   HGBUR MODERATE (A) 12/30/2021 1400   BILIRUBINUR NEGATIVE 12/30/2021 1400   KETONESUR NEGATIVE 12/30/2021 1400   PROTEINUR 100 (A) 12/30/2021 1400   NITRITE NEGATIVE 12/30/2021 1400   LEUKOCYTESUR  MODERATE (A) 12/30/2021  1400    Sepsis Labs: Lactic Acid, Venous No results found for: "LATICACIDVEN"  MICROBIOLOGY: Recent Results (from the past 240 hour(s))  Culture, blood (Routine X 2) w Reflex to ID Panel     Status: None (Preliminary result)   Collection Time: 12/30/21  6:56 PM   Specimen: BLOOD  Result Value Ref Range Status   Specimen Description BLOOD LEFT ANTECUBITAL  Final   Special Requests   Final    BOTTLES DRAWN AEROBIC AND ANAEROBIC Blood Culture results may not be optimal due to an inadequate volume of blood received in culture bottles   Culture   Final    NO GROWTH 2 DAYS Performed at [facility], [place] [address], [place], [place] [address]    Report Status PENDING  Incomplete  Culture, blood (Routine X 2) w Reflex to ID Panel     Status: None (Preliminary result)   Collection Time: 12/30/21  7:10 PM   Specimen: BLOOD RIGHT HAND  Result Value Ref Range Status   Specimen Description BLOOD RIGHT HAND  Final   Special Requests   Final    BOTTLES DRAWN AEROBIC AND ANAEROBIC Blood Culture results may not be optimal due to an inadequate volume of blood received in culture bottles   Culture   Final    NO GROWTH 2 DAYS Performed at [facility], [place] [address], [place], [place] [address]    Report Status PENDING  Incomplete  Urine Culture     Status: Abnormal   Collection Time: 12/31/21  9:40 AM   Specimen: Urine, Catheterized  Result Value Ref Range Status   Specimen Description URINE, CATHETERIZED  Final   Special Requests   Final    NONE Performed at [facility], [place] [address], [place], [place] [address]    Culture MULTIPLE SPECIES PRESENT, SUGGEST RECOLLECTION (A)  Final   Report Status 01/01/2022 FINAL  Final    RADIOLOGY STUDIES/RESULTS: Overnight EEG with video  Result Date: 01/01/2022 Lora Havens, MD     01/01/2022  2:56 PM Patient Name: Isidor Caban MRN: [id number] Epilepsy Attending: Lora Havens Referring Physician/Provider: Candee Furbish,  MD Duration: 12/31/2021 [id number] to 01/01/2022 [id number] Patient history: 71 year old male with altered mental status.  EEG to evaluate for seizure. Level of alertness: Awake, asleep AEDs during EEG study: Phenobarb Technical aspects: This EEG study was done with scalp electrodes positioned according to the 10-20 International system of electrode placement. Electrical activity was acquired at a sampling rate of 500Hz  and reviewed with a high frequency filter of 70Hz  and a low frequency filter of 1Hz . EEG data were recorded continuously and digitally stored. Description: The posterior dominant rhythm consists of 9-10 Hz activity of moderate voltage (25-35 uV) seen predominantly in posterior head regions, symmetric and reactive to eye opening and eye closing. Sleep was characterized by vertex waves, sleep spindles (12 to 14 Hz), maximal frontocentral region. There is an excessive amount of 15 to 18 Hz beta activity distributed symmetrically and diffusely. Patient was noted to have intermittent episodes of left and right upper extremity tremor-like movements. Concomitant EEG before, during and after the event did not show any EEG changes suggest seizure. Hyperventilation and photic stimulation were not performed.   ABNORMALITY - Excessive beta, generalized IMPRESSION: This study is within normal limits. The excessive beta activity seen in the background is most likely due to the effect of benzodiazepine and is a benign EEG pattern. No seizures  or epileptiform discharges were seen throughout the recording. Patient was noted to have intermittent episodes of left and right upper extremity tremor-like movements without concomitant EEG change.  These episodes were NOT epileptic. Priyanka Annabelle Harman     LOS: 3 days   Jeoffrey Massed, MD  [facility]    To contact the attending provider between 7A-7P or the covering provider during after hours 7P-7A, please log into the web site [email or web address] and access using universal [facility] password for that web site. If you do not have the password, please call the hospital operator.  01/02/2022, 12:12 PM

## 2022-01-02 NOTE — Progress Notes (Signed)
Speech Language Pathology Treatment: Dysphagia  Patient Details Name: Corey Meyer MRN: [id number] DOB: 06/07/1951 Today's Date: 01/02/2022 Time: 3435-6861 SLP Time Calculation (min) (ACUTE ONLY): 15 min  Assessment / Plan / Recommendation Clinical Impression  Patient seen by SLP for skilled treatment session focused on dysphagia goals. Patient was awake and alert, cooperative but a little irritable. He was willing to have some PO's after SLP got him sitting up in bed, stating, "its been two days!" SLP observed patient with straw sips of thin liquids (water) with patient exhibiting timely swallow initiation and no overt s/s aspiration or penetration even with large successive sips. He was able to masticate graham cracker despite not having teeth and mastication was only mildly prolonged. SLP is recommending initiate PO diet of Dys 3 solids, thin liquids and will follow briefly for toleration.    HPI HPI: Pt is a 71 yo male, recently admitted for sepsis and had cardiac arrest, who presents 7/2 in the setting of heavy alcohol use with metabolic encephalopathy and seizures. PMH includes: EtOH abuse (1/5 of liquor most days, polysubstance abuse (crack cocaine, meth) gallon a day on the weekend), DM, HTN, afib, CKD, HLD      SLP Plan  Continue with current plan of care      Recommendations for follow up therapy are one component of a multi-disciplinary discharge planning process, led by the attending physician.  Recommendations may be updated based on patient status, additional functional criteria and insurance authorization.    Recommendations  Diet recommendations: Dysphagia 3 (mechanical soft);Thin liquid Liquids provided via: Cup;Straw Medication Administration: Whole meds with puree Supervision: Full supervision/cueing for compensatory strategies;Staff to assist with self feeding Compensations: Minimize environmental distractions;Slow rate;Small sips/bites Postural Changes and/or Swallow  Maneuvers: Seated upright 90 degrees                Oral Care Recommendations: Oral care QID;Oral care prior to ice chip/H20 Follow Up Recommendations: No SLP follow up Assistance recommended at discharge: Intermittent Supervision/Assistance SLP Visit Diagnosis: Dysphagia, unspecified (R13.10) Plan: Continue with current plan of care           Angela Nevin, MA, CCC-SLP Speech Therapy

## 2022-01-02 NOTE — Plan of Care (Signed)
  Problem: Education: Goal: Knowledge of General Education information will improve Description: Including pain rating scale, medication(s)/side effects and non-pharmacologic comfort measures Outcome: Progressing   Problem: Health Behavior/Discharge Planning: Goal: Ability to manage health-related needs will improve Outcome: Progressing   Problem: Clinical Measurements: Goal: Ability to maintain clinical measurements within normal limits will improve Outcome: Progressing Goal: Will remain free from infection Outcome: Progressing Goal: Diagnostic test results will improve Outcome: Progressing Goal: Respiratory complications will improve Outcome: Progressing Goal: Cardiovascular complication will be avoided Outcome: Progressing   Problem: Activity: Goal: Risk for activity intolerance will decrease Outcome: Progressing   Problem: Nutrition: Goal: Adequate nutrition will be maintained Outcome: Progressing   Problem: Coping: Goal: Level of anxiety will decrease Outcome: Progressing   Problem: Elimination: Goal: Will not experience complications related to bowel motility Outcome: Progressing Goal: Will not experience complications related to urinary retention Outcome: Progressing   Problem: Pain Managment: Goal: General experience of comfort will improve Outcome: Progressing   Problem: Safety: Goal: Ability to remain free from injury will improve Outcome: Progressing   Problem: Skin Integrity: Goal: Risk for impaired skin integrity will decrease Outcome: Progressing   Problem: Education: Goal: Knowledge of disease or condition will improve Outcome: Progressing Goal: Understanding of discharge needs will improve Outcome: Progressing   Problem: Health Behavior/Discharge Planning: Goal: Ability to identify changes in lifestyle to reduce recurrence of condition will improve Outcome: Progressing Goal: Identification of resources available to assist in meeting health  care needs will improve Outcome: Progressing   Problem: Physical Regulation: Goal: Complications related to the disease process, condition or treatment will be avoided or minimized Outcome: Progressing   Problem: Safety: Goal: Ability to remain free from injury will improve Outcome: Progressing   Problem: Education: Goal: Ability to describe self-care measures that may prevent or decrease complications (Diabetes Survival Skills Education) will improve Outcome: Progressing Goal: Individualized Educational Video(s) Outcome: Progressing   Problem: Coping: Goal: Ability to adjust to condition or change in health will improve Outcome: Progressing   Problem: Fluid Volume: Goal: Ability to maintain a balanced intake and output will improve Outcome: Progressing   Problem: Health Behavior/Discharge Planning: Goal: Ability to identify and utilize available resources and services will improve Outcome: Progressing Goal: Ability to manage health-related needs will improve Outcome: Progressing   Problem: Metabolic: Goal: Ability to maintain appropriate glucose levels will improve Outcome: Progressing   Problem: Nutritional: Goal: Maintenance of adequate nutrition will improve Outcome: Progressing Goal: Progress toward achieving an optimal weight will improve Outcome: Progressing   Problem: Skin Integrity: Goal: Risk for impaired skin integrity will decrease Outcome: Progressing   Problem: Tissue Perfusion: Goal: Adequacy of tissue perfusion will improve Outcome: Progressing   Problem: Safety: Goal: Non-violent Restraint(s) Outcome: Progressing

## 2022-01-03 LAB — BASIC METABOLIC PANEL
Anion gap: 8 (ref 5–15)
BUN: 15 mg/dL (ref 8–23)
CO2: 20 mmol/L — ABNORMAL LOW (ref 22–32)
Calcium: 8.6 mg/dL — ABNORMAL LOW (ref 8.9–10.3)
Chloride: 104 mmol/L (ref 98–111)
Creatinine, Ser: 1.15 mg/dL (ref 0.61–1.24)
GFR, Estimated: >60 mL/min (ref >60)
Glucose, Bld: 112 mg/dL — ABNORMAL HIGH (ref 70–99)
Potassium: 3.2 mmol/L — ABNORMAL LOW (ref 3.5–5.1)
Sodium: 132 mmol/L — ABNORMAL LOW (ref 135–145)

## 2022-01-03 LAB — CBC
HCT: 26.7 % — ABNORMAL LOW (ref 39.0–52.0)
Hemoglobin: 8.6 g/dL — ABNORMAL LOW (ref 13.0–17.0)
MCH: 29.6 pg (ref 26.0–34.0)
MCHC: 32.2 g/dL (ref 30.0–36.0)
MCV: 91.8 fL (ref 80.0–100.0)
Platelets: 295 10*3/uL (ref 150–400)
RBC: 2.91 MIL/uL — ABNORMAL LOW (ref 4.22–5.81)
RDW: 17.5 % — ABNORMAL HIGH (ref 11.5–15.5)
WBC: 9.8 10*3/uL (ref 4.0–10.5)
nRBC: 0 % (ref 0.0–0.2)

## 2022-01-03 LAB — GLUCOSE, CAPILLARY
Glucose-Capillary: 110 mg/dL — ABNORMAL HIGH (ref 70–99)
Glucose-Capillary: 129 mg/dL — ABNORMAL HIGH (ref 70–99)
Glucose-Capillary: 139 mg/dL — ABNORMAL HIGH (ref 70–99)
Glucose-Capillary: 153 mg/dL — ABNORMAL HIGH (ref 70–99)
Glucose-Capillary: 190 mg/dL — ABNORMAL HIGH (ref 70–99)
Glucose-Capillary: 246 mg/dL — ABNORMAL HIGH (ref 70–99)
Glucose-Capillary: 344 mg/dL — ABNORMAL HIGH (ref 70–99)

## 2022-01-03 LAB — MAGNESIUM: Magnesium: 1.5 mg/dL — ABNORMAL LOW (ref 1.7–2.4)

## 2022-01-03 MED ORDER — POTASSIUM CHLORIDE CRYS ER 20 MEQ PO TBCR
40.0000 meq | EXTENDED_RELEASE_TABLET | ORAL | Status: AC
  Administered 2022-01-03 (×2): 40 meq via ORAL
  Filled 2022-01-03 (×2): qty 2

## 2022-01-03 MED ORDER — APIXABAN 5 MG PO TABS
5.0000 mg | ORAL_TABLET | Freq: Two times a day (BID) | ORAL | Status: DC
  Administered 2022-01-03 – 2022-01-04 (×2): 5 mg via ORAL
  Filled 2022-01-03 (×2): qty 1

## 2022-01-03 MED ORDER — MAGNESIUM SULFATE 4 GM/100ML IV SOLN
4.0000 g | Freq: Once | INTRAVENOUS | Status: AC
  Administered 2022-01-03: 4 g via INTRAVENOUS
  Filled 2022-01-03: qty 100

## 2022-01-03 NOTE — Progress Notes (Signed)
PROGRESS NOTE        PATIENT DETAILS Name: Corey Meyer Age: 71 y.o. Sex: male Date of Birth: 1950-08-21 Admit Date: 12/30/2021 Admitting Physician Martina Sinner, MD UDJ:[id number], Lenn Sink  Brief Summary: Patient is a 71 y.o.  male with a history of polysubstance abuse (alcohol, cocaine, methamphetamine)-recent cardiac arrest in the setting of polysubstance use (May 2023) chronic indwelling Foley catheter,, DM-2, HTN, CKD stage IIIa, HLD, A-fib-who presented with acute encephalopathy in the setting of alcohol withdrawal, hypoglycemia and seizures.   Significant events: 7/2>> admit to [facility] for AMS-alcohol withdrawal symptoms-hypoglycemia 7/2>> transferred to PCCM 7/5>> transferred to [facility], [place] tube removed   Significant studies: 7/2>> CXR: No PNA 7/2>> CT head: No acute intracranial abnormality 7/2>> renal ultrasound: Bilateral hydroureteronephrosis-worse on right-likely due to bladder outlet obstruction. 7/3-7/4>> LTM EEG: No seizures   Significant microbiology data: 7/2>> blood culture: No growth 7/3>> urine culture: Multiple species present.  Procedures:   Consults: PCCM  Subjective: Awake and alert this morning-asking about discharge plans.  Objective: Vitals: Blood pressure 122/78, pulse 94, temperature 98.4 F (36.9 C), temperature source Oral, resp. rate 12, height 5\' 9"  (1.753 m), weight 92.8 kg, SpO2 99 %.   Exam: Gen Exam:Alert awake-not in any distress HEENT:atraumatic, normocephalic Chest: B/L clear to auscultation anteriorly CVS:S1S2 regular Abdomen:soft non tender, non distended Extremities:no edema Neurology: Non focal Skin: no rash   Pertinent Labs/Radiology:    Latest Ref Rng & Units 01/03/2022    1:48 AM 01/02/2022    5:01 AM 01/01/2022    3:03 AM  CBC  WBC 4.0 - 10.5 K/uL 9.8  9.3  8.9   Hemoglobin 13.0 - 17.0 g/dL 8.6  9.2  9.0   Hematocrit 39.0 - 52.0 % 26.7  28.6  28.6   Platelets 150 - 400 K/uL 295   300  293     Lab Results  Component Value Date   NA 132 (L) 01/03/2022   K 3.2 (L) 01/03/2022   CL 104 01/03/2022   CO2 20 (L) 01/03/2022      Assessment/Plan: Acute metabolic encephalopathy: Due to combination of alcohol withdrawal, hypoglycemia, AKI, and postictal state.  Significantly better-completely awake and alert this morning-continue supportive care.   Alcohol withdrawal: Hardly any tremors-significant improvement in mentation-continue tapering phenobarb and as needed benzos.  Mobilize with rehab/RN staff.  Counseled this morning-claims he will try and quit.  Seizures with postictal state: Had a witnessed seizure with EMS while in route to the hospital-in the setting of alcohol withdrawal/hypoglycemia-LTM EEG/CT head stable-on tapering phenobarb-does not require long-term AEDs.  AKI on CKD stage IIIa: AKI likely due to obstructive uropathy-creatinine improved and now close to baseline.  History of urinary retention: Foley catheter present prior to this hospitalization-on Flomax.  Will need outpatient urology follow-up.  Sepsis due to complicated UTI: Likely related to Foley catheter use-stop vancomycin/cefepime-although urine cultures negative-given clinical presentation-we will treat with antibiotics x1 week total-transition to IV Rocephin.  Hypoglycemia: Resolved-likely due to oral hypoglycemic use/drug use-stop glipizide on discharge.  Hypokalemia/hypomagnesemia: Replete and recheck.  History of PAF: Maintaining sinus rhythm-continue metoprolol-discussed at length with patient-he is aware the risk/benefits of continuing to use alcohol and anticoagulation-however he claims that he will stop drinking alcohol-wants to continue on Eliquis.  He will discuss further with his outpatient cardiologist.   History of cardiac arrest: In the setting of  drug use-this occurred in May 2023-who was hospitalized at [facility]  Chronic systolic heart failure: Volume status  stable-follow closely.  HTN: BP stable-continue metoprolol  DM-2: CBG stable with 10 units Levemir twice daily and SSI.  Recent Labs    01/03/22 0045 01/03/22 0402 01/03/22 1242  GLUCAP 129* 110* 344*     Polysubstance abuse(Alcohol/cocaine/methamphetamine): Counseled this morning-claims he has stopped doing cocaine and methamphetamine (UDS negative on admission)-and that he intends to cut back/stop drinking alcohol.  Nutrition Status: Nutrition Problem: Inadequate oral intake Etiology: inability to eat Signs/Symptoms: NPO status Interventions: MVI, Refer to RD note for recommendations  Obesity Estimated body mass index is 30.21 kg/m as calculated from the following:   Height as of this encounter: 5\' 9"  (1.753 m).   Weight as of this encounter: 92.8 kg.   Code status:   Code Status: Full Code   DVT Prophylaxis: heparin injection 5,000 Units Start: 12/30/21 1730   Family Communication: None at bedside-claims his family will be by later today-he does not want me calling any family members.  Disposition Plan: Status is: Inpatient Remains inpatient appropriate because: Watch 1 more day-potential discharge on 7/7-mobilize today.   Planned Discharge Destination:Home   Diet: Diet Order             Diet Carb Modified Fluid consistency: Thin; Room service appropriate? Yes  Diet effective now                     Antimicrobial agents: Anti-infectives (From admission, onward)    Start     Dose/Rate Route Frequency Ordered Stop   01/03/22 1000  cefTRIAXone (ROCEPHIN) 2 g in sodium chloride 0.9 % 100 mL IVPB        2 g 200 mL/hr over 30 Minutes Intravenous Every 24 hours 01/02/22 1229 01/06/22 0959   01/01/22 1200  vancomycin (VANCOREADY) IVPB 750 mg/150 mL  Status:  Discontinued        750 mg 150 mL/hr over 60 Minutes Intravenous Every 24 hours 01/01/22 0840 01/02/22 1227   01/01/22 1000  ceFEPIme (MAXIPIME) 2 g in sodium chloride 0.9 % 100 mL IVPB  Status:   Discontinued        2 g 200 mL/hr over 30 Minutes Intravenous Every 12 hours 01/01/22 0834 01/02/22 1227   12/31/21 1100  vancomycin (VANCOREADY) IVPB 1750 mg/350 mL        1,750 mg 175 mL/hr over 120 Minutes Intravenous  Once 12/31/21 1000 12/31/21 1415   12/31/21 1045  ceFEPIme (MAXIPIME) 2 g in sodium chloride 0.9 % 100 mL IVPB  Status:  Discontinued        2 g 200 mL/hr over 30 Minutes Intravenous Every 24 hours 12/31/21 0949 01/01/22 0834   12/31/21 1045  vancomycin (VANCOREADY) IVPB 1750 mg/350 mL  Status:  Discontinued        1,750 mg 175 mL/hr over 120 Minutes Intravenous Every 48 hours 12/31/21 0953 12/31/21 1000   12/31/21 0959  vancomycin variable dose per unstable renal function (pharmacist dosing)  Status:  Discontinued         Does not apply See admin instructions 12/31/21 1000 01/01/22 0840   12/30/21 2100  cefTRIAXone (ROCEPHIN) 1 g in sodium chloride 0.9 % 100 mL IVPB  Status:  Discontinued        1 g 200 mL/hr over 30 Minutes Intravenous Every 24 hours 12/30/21 2013 12/31/21 0949        MEDICATIONS: Scheduled Meds:  Chlorhexidine Gluconate  Cloth  6 each Topical Daily   folic acid  1 mg Oral Daily   heparin  5,000 Units Subcutaneous Q8H   insulin aspart  0-15 Units Subcutaneous Q4H   insulin detemir  10 Units Subcutaneous BID   metoprolol tartrate  25 mg Oral BID   montelukast  10 mg Oral QHS   multivitamin with minerals  1 tablet Oral Daily   pantoprazole sodium  40 mg Oral Daily   PHENObarbital  32.5 mg Intravenous Q8H   sodium chloride flush  3 mL Intravenous Q12H   thiamine  100 mg Oral Daily   Continuous Infusions:  sodium chloride     cefTRIAXone (ROCEPHIN)  IV 2 g (01/03/22 0920)   PRN Meds:.sodium chloride, acetaminophen, docusate sodium, LORazepam, mouth rinse, polyethylene glycol, sodium chloride flush   I have personally reviewed following labs and imaging studies  LABORATORY DATA: CBC: Recent Labs  Lab 12/30/21 1400 12/31/21 0804  01/01/22 0303 01/02/22 0501 01/03/22 0148  WBC 8.8 7.5 8.9 9.3 9.8  NEUTROABS 7.3  --   --   --   --   HGB 10.1* 9.6* 9.0* 9.2* 8.6*  HCT 32.7* 30.7* 28.6* 28.6* 26.7*  MCV 94.8 93.3 94.4 91.7 91.8  PLT 325 306 293 300 295     Basic Metabolic Panel: Recent Labs  Lab 12/30/21 1400 12/30/21 1719 12/31/21 0804 12/31/21 1709 01/01/22 0303 01/01/22 1550 01/02/22 0501 01/03/22 0148  NA 139  --  137  --  140  --  136 132*  K 4.1  --  4.2  --  4.0  --  3.5 3.2*  CL 109  --  109  --  111  --  109 104  CO2 17*  --  21*  --  20*  --  20* 20*  GLUCOSE 86  --  83  --  325*  --  233* 112*  BUN 28*  --  33*  --  24*  --  19 15  CREATININE 2.92*  --  3.70*  --  2.31*  --  1.36* 1.15  CALCIUM 9.0  --  9.2  --  8.8*  --  8.8* 8.6*  MG  --    < > 2.1 1.8 1.8 1.8 1.7 1.5*  PHOS  --    < > 4.8* 3.7 3.2 2.9 2.6  --    < > = values in this interval not displayed.     GFR: Estimated Creatinine Clearance: 69.1 mL/min (by C-G formula based on SCr of 1.15 mg/dL).  Liver Function Tests: Recent Labs  Lab 12/30/21 1400  AST 20  ALT 10  ALKPHOS 108  BILITOT 0.5  PROT 7.5  ALBUMIN 3.4*    No results for input(s): "LIPASE", "AMYLASE" in the last 168 hours. No results for input(s): "AMMONIA" in the last 168 hours.  Coagulation Profile: Recent Labs  Lab 12/30/21 1719  INR 1.0     Cardiac Enzymes: No results for input(s): "CKTOTAL", "CKMB", "CKMBINDEX", "TROPONINI" in the last 168 hours.  BNP (last 3 results) No results for input(s): "PROBNP" in the last 8760 hours.  Lipid Profile: No results for input(s): "CHOL", "HDL", "LDLCALC", "TRIG", "CHOLHDL", "LDLDIRECT" in the last 72 hours.  Thyroid Function Tests: No results for input(s): "TSH", "T4TOTAL", "FREET4", "T3FREE", "THYROIDAB" in the last 72 hours.  Anemia Panel: No results for input(s): "VITAMINB12", "FOLATE", "FERRITIN", "TIBC", "IRON", "RETICCTPCT" in the last 72 hours.  Urine analysis:    Component Value  Date/Time   COLORURINE  AMBER (A) 12/30/2021 1400   APPEARANCEUR TURBID (A) 12/30/2021 1400   LABSPEC 1.010 12/30/2021 1400   PHURINE 6.0 12/30/2021 1400   GLUCOSEU 50 (A) 12/30/2021 1400   HGBUR MODERATE (A) 12/30/2021 1400   BILIRUBINUR NEGATIVE 12/30/2021 1400   KETONESUR NEGATIVE 12/30/2021 1400   PROTEINUR 100 (A) 12/30/2021 1400   NITRITE NEGATIVE 12/30/2021 1400   LEUKOCYTESUR MODERATE (A) 12/30/2021 1400    Sepsis Labs: Lactic Acid, Venous No results found for: "LATICACIDVEN"  MICROBIOLOGY: Recent Results (from the past 240 hour(s))  Culture, blood (Routine X 2) w Reflex to ID Panel     Status: None (Preliminary result)   Collection Time: 12/30/21  6:56 PM   Specimen: BLOOD  Result Value Ref Range Status   Specimen Description BLOOD LEFT ANTECUBITAL  Final   Special Requests   Final    BOTTLES DRAWN AEROBIC AND ANAEROBIC Blood Culture results may not be optimal due to an inadequate volume of blood received in culture bottles   Culture   Final    NO GROWTH 4 DAYS Performed at [facility], [place] [address] [place], [place] [address]    Report Status PENDING  Incomplete  Culture, blood (Routine X 2) w Reflex to ID Panel     Status: None (Preliminary result)   Collection Time: 12/30/21  7:10 PM   Specimen: BLOOD RIGHT HAND  Result Value Ref Range Status   Specimen Description BLOOD RIGHT HAND  Final   Special Requests   Final    BOTTLES DRAWN AEROBIC AND ANAEROBIC Blood Culture results may not be optimal due to an inadequate volume of blood received in culture bottles   Culture   Final    NO GROWTH 4 DAYS Performed at [facility], [place] [address], [place], [place] [address]    Report Status PENDING  Incomplete  Urine Culture     Status: Abnormal   Collection Time: 12/31/21  9:40 AM   Specimen: Urine, Catheterized  Result Value Ref Range Status   Specimen Description URINE, CATHETERIZED  Final   Special Requests   Final    NONE Performed at [facility], [place] [address], [place], [place] [address]    Culture MULTIPLE SPECIES PRESENT, SUGGEST RECOLLECTION (A)  Final   Report Status 01/01/2022 FINAL  Final    RADIOLOGY STUDIES/RESULTS: No results found.   LOS: 4 days   Oren Binet, MD  [facility]    To contact the attending provider between 7A-7P or the covering provider during after hours 7P-7A, please log into the web site [email or web address] and access using universal [place] password for that web site. If you do not have the password, please call the hospital operator.  01/03/2022, 2:55 PM

## 2022-01-03 NOTE — Progress Notes (Signed)
Speech Language Pathology Treatment: Dysphagia  Patient Details Name: Corey Meyer MRN: [id number] DOB: 10-04-1950 Today's Date: 01/03/2022 Time: 5110-2111 SLP Time Calculation (min) (ACUTE ONLY): 15 min  Assessment / Plan / Recommendation Clinical Impression  Patient seen by SLP for skilled treatment session focused on dysphagia goals. When SLP entered room, patient sitting up, awake and alert and had eaten entire breakfast meal tray. He declined to have any solids at this time but did request some juice. SLP observed him with sips of thin liquids (juice) and although he exhibited one delayed throat clear, this appeared to be related to phlegm production rather than penetration/aspiration. Initially, when SLP asked if he would like diet advanced he said, "you can leave it how it is" but when SLP then showed patient menu, he saw food options and was receptive to upgrade. SLP assisted patient with ordering his lunch and plans to see patient at lunch time to ensure toleration.    HPI HPI: Pt is a 71 yo male, recently admitted for sepsis and had cardiac arrest, who presents 7/2 in the setting of heavy alcohol use with metabolic encephalopathy and seizures. PMH includes: EtOH abuse (1/5 of liquor most days, polysubstance abuse (crack cocaine, meth) gallon a day on the weekend), DM, HTN, afib, CKD, HLD      SLP Plan  Continue with current plan of care      Recommendations for follow up therapy are one component of a multi-disciplinary discharge planning process, led by the attending physician.  Recommendations may be updated based on patient status, additional functional criteria and insurance authorization.    Recommendations  Diet recommendations: Regular;Thin liquid Liquids provided via: Cup;Straw Medication Administration: Whole meds with liquid Supervision: Intermittent supervision to cue for compensatory strategies;Patient able to self feed Compensations: Slow rate;Small  sips/bites Postural Changes and/or Swallow Maneuvers: Seated upright 90 degrees                Oral Care Recommendations: Oral care BID Follow Up Recommendations: No SLP follow up Assistance recommended at discharge: Intermittent Supervision/Assistance SLP Visit Diagnosis: Dysphagia, unspecified (R13.10) Plan: Continue with current plan of care          Angela Nevin, MA, CCC-SLP Speech Therapy

## 2022-01-03 NOTE — Progress Notes (Signed)
Nutrition Follow-up  DOCUMENTATION CODES:   Obesity unspecified  INTERVENTION:   Continue Multivitamin w/ minerals daily Meal ordering with assist Encourage good PO intake Discontinue TF regimen due to no access  NUTRITION DIAGNOSIS:   Inadequate oral intake related to inability to eat as evidenced by NPO status. - Progressing, pt now on PO diet  GOAL:   Patient will meet greater than or equal to 90% of their needs - Progressing   MONITOR:   PO intake, Labs, I & O's  REASON FOR ASSESSMENT:   Consult Enteral/tube feeding initiation and management  ASSESSMENT:   Pt with PMH of heavy substance abuse 1/5th vodka daily and 1/2 gallon on weekends, DM, and recently dx with sepsis and cardiac arrest who is now admitted metabolic encephalopathy and seizures.  7/03 - Cortrak placed (tip gastric) 7/04 - transferred to floor 7/05 - diet advanced to Dysphagia 3, thin liquids; Cortrak removed 7/06 - diet advanced to regular  Pt sitting up in bed, eating lunch. Pt reports that he was eating well at home. States that his lunch is good too. Denies any nausea or vomiting.  States that it felt good to have the tube removed.   Pt with no other questions or concerns at this time.   Medications reviewed and include: Folic acid, NovoLog, Levemir, MVI, Protonix, Potassium Chloride, Thiamine, IV antibiotics Labs reviewed: Sodium 132, Potassium 3.2, Magnesium 1.5, Hgb A1c 6.2%, 24 hr CBG 110-264  Diet Order:   Diet Order             Diet Carb Modified Fluid consistency: Thin; Room service appropriate? Yes  Diet effective now                   EDUCATION NEEDS:   No education needs have been identified at this time  Skin:  Skin Assessment: Reviewed RN Assessment  Last BM:  7/3  Height:   Ht Readings from Last 1 Encounters:  12/30/21 5\' 9"  (1.753 m)    Weight:   Wt Readings from Last 1 Encounters:  01/01/22 92.8 kg    BMI:  Body mass index is 30.21  kg/m.  Estimated Nutritional Needs:   Kcal:  2100-2300  Protein:  105-115 grams  Fluid:  >2 L/day    03/04/22 RD, LDN Clinical Dietitian See [facility] for contact information.

## 2022-01-03 NOTE — Evaluation (Signed)
Physical Therapy Evaluation Patient Details Name: Corey Meyer MRN: [id number] DOB: 11-12-1950 Today's Date: 01/03/2022  History of Present Illness  Pt is a 71 y.o. male admitted 12/30/21 with acute encephalopathy in the setting of alcohol withdrawal, hypoglycemia and seizures; pt also with sepsis secondary to complicated UTI. Head CT negative for acute injury. Renal US with bilateral hydroureteronephrosis. LTM EEG negative for seizures. PMH includes recent cardiac arrest secondary to polysubstance abuse (10/2021), chronic indwelling foley cath, HTN, DM2, CKD3, HLD, afib.   Clinical Impression  Pt presents with an overall decrease in functional mobility secondary to above. PTA, pt reports independent without DME, lives with roommates who are available to assist as needed. Today, pt ambulatory with close min guard due to multiple bouts of self-corrected instability; pt demonstrates poor attention and decreased awareness, unsure baseline cognition. Pt reports adamant about d/c home tomorrow. If to remain admitted, will follow acutely to address established goals.   Recommendations for follow up therapy are one component of a multi-disciplinary discharge planning process, led by the attending physician.  Recommendations may be updated based on patient status, additional functional criteria and insurance authorization.  Follow Up Recommendations No PT follow up      Assistance Recommended at Discharge Frequent or constant Supervision/Assistance  Patient can return home with the following  Assistance with cooking/housework;Direct supervision/assist for medications management;Direct supervision/assist for financial management;Assist for transportation    Equipment Recommendations None recommended by PT  Recommendations for Other Services       Functional Status Assessment Patient has had a recent decline in their functional status and demonstrates the ability to make significant improvements in  function in a reasonable and predictable amount of time.     Precautions / Restrictions Precautions Precautions: Fall;Other (comment) Precaution Comments: chronic indwelling foley cath Restrictions Weight Bearing Restrictions: No      Mobility  Bed Mobility Overal bed mobility: Modified Independent             General bed mobility comments: supine<>sit with HOB elevated    Transfers Overall transfer level: Needs assistance Equipment used: None Transfers: Sit to/from Stand Sit to Stand: Min guard           General transfer comment: min guard for balance    Ambulation/Gait Ambulation/Gait assistance: Min guard Gait Distance (Feet): 210 Feet Assistive device: None Gait Pattern/deviations: Step-through pattern, Decreased stride length Gait velocity: Decreased     General Gait Details: slow, mildly unsteady gait without DME, min guard for balance; multiple self-corrected LOB, especially with turns and when going to greet roommate in hallway; pt managing his own foley bag  Stairs            Wheelchair Mobility    Modified Rankin (Stroke Patients Only)       Balance Overall balance assessment: Needs assistance   Sitting balance-Leahy Scale: Good Sitting balance - Comments: able to don bilateral socks sitting EOB   Standing balance support: No upper extremity supported, During functional activity Standing balance-Leahy Scale: Fair               High level balance activites: Side stepping, Backward walking, Direction changes, Turns, Sudden stops, Head turns High Level Balance Comments: multiple self-corrected LOB with ambulation and higher level balance tasks             Pertinent Vitals/Pain Pain Assessment Pain Assessment: Faces Faces Pain Scale: Hurts a little bit Pain Location: RUE (from IV/blood draws) Pain Descriptors / Indicators: Sore Pain Intervention(s): Monitored during session  Home Living Family/patient expects to be  discharged to:: Private residence Living Arrangements: Non-relatives/Friends Available Help at Discharge: Friend(s);Available PRN/intermittently Type of Home: House Home Access: Stairs to enter   [employer] of Steps: 1   Home Layout: One level Home Equipment: None Additional Comments: lives with two roommates available to assist as needed. rooommate Simona Huh) present at end of session    Prior Function Prior Level of Function : Independent/Modified Independent;Driving             Mobility Comments: reports indep without DME       Hand Dominance        Extremity/Trunk Assessment   Upper Extremity Assessment Upper Extremity Assessment: Overall WFL for tasks assessed    Lower Extremity Assessment Lower Extremity Assessment: Overall WFL for tasks assessed       Communication   Communication: No difficulties  Cognition Arousal/Alertness: Awake/alert Behavior During Therapy: WFL for tasks assessed/performed, Impulsive Overall Cognitive Status: No family/caregiver present to determine baseline cognitive functioning Area of Impairment: Attention, Safety/judgement, Awareness                   Current Attention Level: Selective     Safety/Judgement: Decreased awareness of safety, Decreased awareness of deficits Awareness: Emergent   General Comments: easily distracted requiring frequent cues to stay on task; poor awareness of instability        General Comments      Exercises     Assessment/Plan    PT Assessment Patient needs continued PT services  PT Problem List Decreased activity tolerance;Decreased balance;Decreased mobility;Decreased cognition;Decreased safety awareness       PT Treatment Interventions DME instruction;Gait training;Stair training;Functional mobility training;Therapeutic activities;Therapeutic exercise;Balance training;Patient/family education    PT Goals (Current goals can be found in the Care Plan section)  Acute  Rehab PT Goals Patient Stated Goal: "I'm going home tomorrow no matter what" PT Goal Formulation: With patient Time For Goal Achievement: 01/17/22 Potential to Achieve Goals: Good    Frequency Min 3X/week     Co-evaluation               AM-PAC PT "6 Clicks" Mobility  Outcome Measure Help needed turning from your back to your side while in a flat bed without using bedrails?: None Help needed moving from lying on your back to sitting on the side of a flat bed without using bedrails?: None Help needed moving to and from a bed to a chair (including a wheelchair)?: A Little Help needed standing up from a chair using your arms (e.g., wheelchair or bedside chair)?: A Little Help needed to walk in hospital room?: A Little Help needed climbing 3-5 steps with a railing? : A Little 6 Click Score: 20    End of Session Equipment Utilized During Treatment: Gait belt Activity Tolerance: Patient tolerated treatment well Patient left: in bed;with call bell/phone within reach;with bed alarm set;with family/visitor present Nurse Communication: Mobility status PT Visit Diagnosis: Other abnormalities of gait and mobility (R26.89)    Time: 1962-2297 PT Time Calculation (min) (ACUTE ONLY): 18 min   Charges:   PT Evaluation $PT Eval Moderate Complexity: 1 Mod         Ina Homes, PT, DPT Acute Rehabilitation Services  Personal: Secure Chat Rehab Office: [phone]  Malachy Chamber 01/03/2022, 5:02 PM

## 2022-01-04 ENCOUNTER — Other Ambulatory Visit (HOSPITAL_COMMUNITY): Payer: Self-pay

## 2022-01-04 LAB — CBC
HCT: 26.8 % — ABNORMAL LOW (ref 39.0–52.0)
Hemoglobin: 8.6 g/dL — ABNORMAL LOW (ref 13.0–17.0)
MCH: 29.2 pg (ref 26.0–34.0)
MCHC: 32.1 g/dL (ref 30.0–36.0)
MCV: 90.8 fL (ref 80.0–100.0)
Platelets: 292 10*3/uL (ref 150–400)
RBC: 2.95 MIL/uL — ABNORMAL LOW (ref 4.22–5.81)
RDW: 17.7 % — ABNORMAL HIGH (ref 11.5–15.5)
WBC: 8.3 10*3/uL (ref 4.0–10.5)
nRBC: 0 % (ref 0.0–0.2)

## 2022-01-04 LAB — GLUCOSE, CAPILLARY
Glucose-Capillary: 64 mg/dL — ABNORMAL LOW (ref 70–99)
Glucose-Capillary: 130 mg/dL — ABNORMAL HIGH (ref 70–99)
Glucose-Capillary: 248 mg/dL — ABNORMAL HIGH (ref 70–99)
Glucose-Capillary: 86 mg/dL (ref 70–99)

## 2022-01-04 LAB — CULTURE, BLOOD (ROUTINE X 2)
Culture: NO GROWTH
Culture: NO GROWTH

## 2022-01-04 LAB — BASIC METABOLIC PANEL
Anion gap: 6 (ref 5–15)
BUN: 14 mg/dL (ref 8–23)
CO2: 23 mmol/L (ref 22–32)
Calcium: 8.3 mg/dL — ABNORMAL LOW (ref 8.9–10.3)
Chloride: 104 mmol/L (ref 98–111)
Creatinine, Ser: 1.25 mg/dL — ABNORMAL HIGH (ref 0.61–1.24)
GFR, Estimated: >60 mL/min (ref >60)
Glucose, Bld: 138 mg/dL — ABNORMAL HIGH (ref 70–99)
Potassium: 3.8 mmol/L (ref 3.5–5.1)
Sodium: 133 mmol/L — ABNORMAL LOW (ref 135–145)

## 2022-01-04 LAB — MAGNESIUM: Magnesium: 2 mg/dL (ref 1.7–2.4)

## 2022-01-04 MED ORDER — INSULIN DETEMIR 100 UNIT/ML ~~LOC~~ SOLN
10.0000 [IU] | Freq: Every day | SUBCUTANEOUS | Status: DC
Start: 2022-01-04 — End: 2022-01-04
  Administered 2022-01-04: 10 [IU] via SUBCUTANEOUS
  Filled 2022-01-04: qty 0.1

## 2022-01-04 MED ORDER — CARVEDILOL 6.25 MG PO TABS
6.2500 mg | ORAL_TABLET | Freq: Two times a day (BID) | ORAL | 1 refills | Status: DC
  Filled 2022-01-04: qty 60, 30d supply, fill #0

## 2022-01-04 MED ORDER — POTASSIUM CHLORIDE CRYS ER 20 MEQ PO TBCR
20.0000 meq | EXTENDED_RELEASE_TABLET | Freq: Every day | ORAL | 1 refills | Status: DC
  Filled 2022-01-04: qty 30, 30d supply, fill #0

## 2022-01-04 MED ORDER — CEPHALEXIN 500 MG PO CAPS
500.0000 mg | ORAL_CAPSULE | Freq: Two times a day (BID) | ORAL | 0 refills | Status: AC
  Filled 2022-01-04: qty 4, 2d supply, fill #0

## 2022-01-04 NOTE — Discharge Summary (Signed)
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 Please take all your medications with you for your next visit with your Primary MD  Laboratory/radiological data: Please request your Primary MD to go over all hospital tests and procedure/radiological results at the follow up, please ask your Primary MD to get all Hospital records sent to his/her office.  In some cases, they will be blood work, cultures and biopsy results pending at the time of your discharge. Please request that your primary care M.D. goes through all the records of your hospital data and follows up on these results.  Also Note the following: If you experience worsening of your admission symptoms, develop shortness of breath, life threatening emergency, suicidal or homicidal thoughts you must seek medical attention immediately by calling 911 or calling  your MD immediately  if symptoms less severe.  You must read complete instructions/literature along with all the possible adverse reactions/side effects for all the Medicines you take and that have been prescribed to you. Take any new Medicines after you have completely understood and accpet all the possible adverse reactions/side effects.   Do not drive when taking Pain medications or sleeping medications (Benzodaizepines)  Do not take more than prescribed Pain, Sleep and Anxiety Medications. It is not advisable to combine anxiety,sleep and pain medications without talking with your primary care practitioner  Special Instructions: If you have smoked or chewed Tobacco  in the last 2 yrs please stop smoking, stop any regular Alcohol  and or any Recreational drug use.  Wear Seat belts while driving.  Please note: You were cared for by a hospitalist during your hospital stay. Once you are discharged, your primary care physician will handle any further medical issues. Please note that NO REFILLS for any discharge medications will be authorized once you are discharged, as it is imperative that you return to your primary care physician (or establish a relationship with a primary care physician if you do not have one) for your post hospital discharge needs so that they can reassess your need for medications and monitor your lab values.  Total Time spent coordinating discharge including counseling, education and face to face time equals greater than 30 minutes.  Signed: Oren Binet 01/04/2022 9:20 AM

## 2022-01-04 NOTE — Evaluation (Signed)
Occupational Therapy Evaluation Patient Details Name: Corey Meyer MRN: [id number] DOB: [birth year] Today's Date: 01/04/2022   History of Present Illness Pt is a 71 y.o. male admitted 12/30/21 with acute encephalopathy in the setting of alcohol withdrawal, hypoglycemia and seizures; pt also with sepsis secondary to complicated UTI. Head CT negative for acute injury. Renal US with bilateral hydroureteronephrosis. LTM EEG negative for seizures. PMH includes recent cardiac arrest secondary to polysubstance abuse (10/2021), chronic indwelling foley cath, HTN, DM2, CKD3, HLD, afib.   Clinical Impression   Pt was functioning independently and lives with supportive roommates. Presents with impaired dynamic standing balance and decreased awareness of safety and deficits. He is overall functioning at up to a supervision level. Plan is to discharge home this afternoon. Pt declines any DME. No further OT needs.      Recommendations for follow up therapy are one component of a multi-disciplinary discharge planning process, led by the attending physician.  Recommendations may be updated based on patient status, additional functional criteria and insurance authorization.   Follow Up Recommendations  No OT follow up    Assistance Recommended at Discharge Intermittent Supervision/Assistance  Patient can return home with the following A little help with walking and/or transfers;Assist for transportation;Help with stairs or ramp for entrance    Functional Status Assessment  Patient has had a recent decline in their functional status and demonstrates the ability to make significant improvements in function in a reasonable and predictable amount of time.  Equipment Recommendations  None recommended by OT    Recommendations for Other Services       Precautions / Restrictions Precautions Precautions: Fall Precaution Comments: chronic indwelling foley cath Restrictions Weight Bearing Restrictions: No       Mobility Bed Mobility Overal bed mobility: Modified Independent                  Transfers Overall transfer level: Needs assistance Equipment used: None Transfers: Sit to/from Stand Sit to Stand: Supervision                  Balance Overall balance assessment: Needs assistance   Sitting balance-Leahy Scale: Good Sitting balance - Comments: able to don bilateral socks sitting EOB   Standing balance support: No upper extremity supported, During functional activity Standing balance-Leahy Scale: Fair                             ADL either performed or assessed with clinical judgement   ADL Overall ADL's : Needs assistance/impaired Eating/Feeding: Independent   Grooming: Supervision/safety;Standing;Wash/dry hands   Upper Body Bathing: Set up;Sitting   Lower Body Bathing: Supervison/ safety;Sit to/from stand   Upper Body Dressing : Set up;Sitting   Lower Body Dressing: Supervision/safety;Sit to/from stand   Toilet Transfer: Min guard;Ambulation   Toileting- Clothing Manipulation and Hygiene: Supervision/safety;Sit to/from stand       Functional mobility during ADLs: Min guard       Vision         Perception     Praxis      Pertinent Vitals/Pain Pain Assessment Pain Assessment: No/denies pain     Hand Dominance Right   Extremity/Trunk Assessment Upper Extremity Assessment Upper Extremity Assessment: Overall WFL for tasks assessed   Lower Extremity Assessment Lower Extremity Assessment: Defer to PT evaluation       Communication Communication Communication: No difficulties   Cognition Arousal/Alertness: Awake/alert Behavior During Therapy: WFL for tasks assessed/performed, Impulsive Overall Cognitive  Status: No family/caregiver present to determine baseline cognitive functioning Area of Impairment: Attention, Safety/judgement, Awareness                   Current Attention Level: Selective      Safety/Judgement: Decreased awareness of safety, Decreased awareness of deficits Awareness: Emergent   General Comments: decreased awareness of imbalance     General Comments       Exercises     Shoulder Instructions      Home Living Family/patient expects to be discharged to:: Private residence Living Arrangements: Non-relatives/Friends Available Help at Discharge: Friend(s);Available PRN/intermittently Type of Home: House Home Access: Stairs to enter [employer] of Steps: 1   Home Layout: One level     Bathroom Shower/Tub: Chief Strategy Officer: Standard     Home Equipment: None   Additional Comments: lives with two roommates available to assist as needed      Prior Functioning/Environment Prior Level of Function : Independent/Modified Independent;Driving             Mobility Comments: reports indep without DME          OT Problem List:        OT Treatment/Interventions:      OT Goals(Current goals can be found in the care plan section) Acute Rehab OT Goals OT Goal Formulation: With patient Time For Goal Achievement: 01/18/22 Potential to Achieve Goals: Good  OT Frequency:      Co-evaluation              AM-PAC OT "6 Clicks" Daily Activity     Outcome Measure Help from another person eating meals?: None Help from another person taking care of personal grooming?: None Help from another person toileting, which includes using toliet, bedpan, or urinal?: None Help from another person bathing (including washing, rinsing, drying)?: None Help from another person to put on and taking off regular upper body clothing?: None Help from another person to put on and taking off regular lower body clothing?: None 6 Click Score: 24   End of Session    Activity Tolerance: Patient tolerated treatment well Patient left: in bed;with call bell/phone within reach;with bed alarm set  OT Visit Diagnosis: Unsteadiness on feet (R26.81)                 Time: 4332-9518 OT Time Calculation (min): 13 min Charges:  OT General Charges $OT Visit: 1 Visit OT Evaluation $OT Eval Low Complexity: 1 Low  Berna Spare, OTR/L Acute Rehabilitation Services Office: [phone]   Evern Bio 01/04/2022, 11:26 AM

## 2022-01-04 NOTE — Progress Notes (Signed)
   01/04/22 1141  AVS Discharge Documentation  AVS Discharge Instructions Including Medications Provided to patient/caregiver  Name of Person Receiving AVS Discharge Instructions Including Medications Gwynneth Albright  Name of Clinician That Reviewed AVS Discharge Instructions Including Medications Freada Bergeron RN

## 2022-01-04 NOTE — TOC Initial Note (Signed)
Transition of Care [facility]) - Initial/Assessment Note    Patient Details  Name: Corey Meyer MRN: [id number] Date of Birth: February 19, 1951  Transition of Care [facility] CM/SW Contact:    [place], Student-Social Work Phone Number: 01/04/2022, 1:20 PM  Clinical Narrative:                 MSW intern spoke with patient at bedside regarding his alcohol use. Patient states he knows he has a problem with alcohol and drinks a fifth of liquor a day and on the weekends he drinks a gallon of liquor a day. MSW intern asked patient if he would like resources. Patient accepted resources for the [place]. Patient stated he knew his alcohol use was "killing him" and he knew to leave it alone from here forward. Patient was very appreciative of MSW meeting with him and stated he is going to seek out help.  Expected Discharge Plan: Home/Self Care Barriers to Discharge: Barriers Resolved   Patient Goals and CMS Choice Patient states their goals for this hospitalization and ongoing recovery are:: Return home      Expected Discharge Plan and Services Expected Discharge Plan: Home/Self Care       Living arrangements for the past 2 months: Single Family Home Expected Discharge Date: 01/04/22                                    Prior Living Arrangements/Services Living arrangements for the past 2 months: Single Family Home Lives with:: Self Patient language and need for interpreter reviewed:: Yes Do you feel safe going back to the place where you live?: Yes      Need for Family Participation in Patient Care: No (Comment)     Criminal Activity/Legal Involvement Pertinent to Current Situation/Hospitalization: No - Comment as needed  Activities of Daily Living      Permission Sought/Granted                  Emotional Assessment Appearance:: Appears stated age Attitude/Demeanor/Rapport: Engaged Affect (typically observed): Accepting Orientation: : Oriented to Self, Oriented to Place,  Oriented to  Time, Oriented to Situation Alcohol / Substance Use: Alcohol Use Psych Involvement: No (comment)  Admission diagnosis:  Alcohol withdrawal (HCC) [F10.939] Hypoglycemia [E16.2] Acute kidney injury superimposed on chronic kidney disease (HCC) [N17.9, N18.9] Acute metabolic encephalopathy [G93.41] Patient Active Problem List   Diagnosis Date Noted   Acute metabolic encephalopathy 12/30/2021   Alcohol withdrawal (HCC) 12/30/2021   Hypoglycemia associated with diabetes (HCC) 12/30/2021   Seizure (HCC) 12/30/2021   Acute urinary retention 12/30/2021   Acute kidney injury superimposed on chronic kidney disease (HCC) 12/30/2021   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 [place] [address] Phone: [phone] Fax: [phone]  [facility] [address] [place] [address] Phone: [phone] Fax: [phone]     Social Determinants of Health (SDOH) Interventions    Readmission Risk Interventions     No data to display

## 2022-01-04 NOTE — Progress Notes (Signed)
Notified patient's BG is 64. Patient currently asymptomatic. Orange juice given and will recheck BG.

## 2022-01-05 ENCOUNTER — Other Ambulatory Visit: Payer: Self-pay

## 2022-01-05 ENCOUNTER — Emergency Department (HOSPITAL_COMMUNITY)
Admission: EM | Admit: 2022-01-05 | Discharge: 2022-01-05 | Discharge status: Against Medical Advice | Payer: No Typology Code available for payment source | Attending: Emergency Medicine | Admitting: Emergency Medicine

## 2022-01-05 DIAGNOSIS — R319 Hematuria, unspecified: Secondary | ICD-10-CM | POA: Insufficient documentation

## 2022-01-05 DIAGNOSIS — R339 Retention of urine, unspecified: Secondary | ICD-10-CM | POA: Insufficient documentation

## 2022-01-05 DIAGNOSIS — Z5321 Procedure and treatment not carried out due to patient leaving prior to being seen by health care provider: Secondary | ICD-10-CM | POA: Diagnosis not present

## 2022-01-05 LAB — URINALYSIS, ROUTINE W REFLEX MICROSCOPIC
Bilirubin Urine: NEGATIVE
Glucose, UA: >=500 mg/dL — AB
Ketones, ur: NEGATIVE mg/dL
Nitrite: NEGATIVE
Protein, ur: 30 mg/dL — AB
RBC / HPF: >50 RBC/hpf — ABNORMAL HIGH (ref 0–5)
Specific Gravity, Urine: 1.011 (ref 1.005–1.030)
WBC, UA: >50 WBC/hpf — ABNORMAL HIGH (ref 0–5)
pH: 6 (ref 5.0–8.0)

## 2022-01-05 NOTE — ED Triage Notes (Signed)
Patient c/o not being able to urinate, urinating around catheter. Blood coming from penis. Reports he has had catheter about two months. Says he changed catheter yesterday . Pain rated 8/10

## 2022-01-05 NOTE — ED Notes (Signed)
Pt eloped. Corey Meyer, [place] aware.

## 2022-01-05 NOTE — ED Provider Triage Note (Signed)
Emergency Medicine Provider Triage Evaluation Note  Sava Proby , a 71 y.o. male  was evaluated in triage.  Pt complains of urinary retention since last night. Has indwelling foley catheter and since last night, has been having urine come out around the catheter as well as blood from his urethra.   Review of Systems  Positive: Abdominal distention and pain, urinary leakage, hematuria Negative: Fever, vomiting  Physical Exam  BP (!) 128/98 (BP Location: Left Arm)   Pulse 98   Resp 18   Ht 5\' 9"  (1.753 m)   Wt 94.2 kg   SpO2 98%   BMI 30.67 kg/m  Gen:   Awake, no distress   Resp:  Normal effort  MSK:   Moves extremities without difficulty  Other:    Medical Decision Making  Medically screening exam initiated at 2:09 PM.  Appropriate orders placed.  Osiel Stick was informed that the remainder of the evaluation will be completed by another provider, this initial triage assessment does not replace that evaluation, and the importance of remaining in the ED until their evaluation is complete.     Jahmari Esbenshade T, PA-C 01/05/22 1410

## 2022-01-15 ENCOUNTER — Emergency Department (HOSPITAL_COMMUNITY)
Admission: EM | Admit: 2022-01-15 | Discharge: 2022-01-15 | Disposition: A | Discharge status: Home / Self Care | Payer: No Typology Code available for payment source | Attending: Emergency Medicine | Admitting: Emergency Medicine

## 2022-01-15 ENCOUNTER — Encounter (HOSPITAL_COMMUNITY): Payer: Self-pay

## 2022-01-15 ENCOUNTER — Other Ambulatory Visit: Payer: Self-pay

## 2022-01-15 DIAGNOSIS — R339 Retention of urine, unspecified: Secondary | ICD-10-CM | POA: Insufficient documentation

## 2022-01-15 DIAGNOSIS — Z7984 Long term (current) use of oral hypoglycemic drugs: Secondary | ICD-10-CM | POA: Insufficient documentation

## 2022-01-15 DIAGNOSIS — Z7901 Long term (current) use of anticoagulants: Secondary | ICD-10-CM | POA: Insufficient documentation

## 2022-01-15 DIAGNOSIS — N390 Urinary tract infection, site not specified: Secondary | ICD-10-CM | POA: Insufficient documentation

## 2022-01-15 DIAGNOSIS — T839XXA Unspecified complication of genitourinary prosthetic device, implant and graft, initial encounter: Secondary | ICD-10-CM

## 2022-01-15 LAB — CBC WITH DIFFERENTIAL/PLATELET
Abs Immature Granulocytes: 0.05 10*3/uL (ref 0.00–0.07)
Basophils Absolute: 0.1 10*3/uL (ref 0.0–0.1)
Basophils Relative: 1 %
Eosinophils Absolute: 0.4 10*3/uL (ref 0.0–0.5)
Eosinophils Relative: 4 %
HCT: 33.9 % — ABNORMAL LOW (ref 39.0–52.0)
Hemoglobin: 10.5 g/dL — ABNORMAL LOW (ref 13.0–17.0)
Immature Granulocytes: 1 %
Lymphocytes Relative: 13 %
Lymphs Abs: 1.4 10*3/uL (ref 0.7–4.0)
MCH: 29.7 pg (ref 26.0–34.0)
MCHC: 31 g/dL (ref 30.0–36.0)
MCV: 95.8 fL (ref 80.0–100.0)
Monocytes Absolute: 1 10*3/uL (ref 0.1–1.0)
Monocytes Relative: 9 %
Neutro Abs: 8 10*3/uL — ABNORMAL HIGH (ref 1.7–7.7)
Neutrophils Relative %: 72 %
Platelets: 634 10*3/uL — ABNORMAL HIGH (ref 150–400)
RBC: 3.54 MIL/uL — ABNORMAL LOW (ref 4.22–5.81)
RDW: 17.4 % — ABNORMAL HIGH (ref 11.5–15.5)
WBC: 11 10*3/uL — ABNORMAL HIGH (ref 4.0–10.5)
nRBC: 0 % (ref 0.0–0.2)

## 2022-01-15 LAB — URINALYSIS, ROUTINE W REFLEX MICROSCOPIC
Bilirubin Urine: NEGATIVE
Glucose, UA: >=500 mg/dL — AB
Ketones, ur: NEGATIVE mg/dL
Nitrite: NEGATIVE
Protein, ur: 30 mg/dL — AB
Specific Gravity, Urine: 1.005 (ref 1.005–1.030)
WBC, UA: >50 WBC/hpf — ABNORMAL HIGH (ref 0–5)
pH: 6 (ref 5.0–8.0)

## 2022-01-15 LAB — BASIC METABOLIC PANEL
Anion gap: 9 (ref 5–15)
BUN: 12 mg/dL (ref 8–23)
CO2: 23 mmol/L (ref 22–32)
Calcium: 10.1 mg/dL (ref 8.9–10.3)
Chloride: 111 mmol/L (ref 98–111)
Creatinine, Ser: 1.74 mg/dL — ABNORMAL HIGH (ref 0.61–1.24)
GFR, Estimated: 42 mL/min — ABNORMAL LOW (ref >60)
Glucose, Bld: 124 mg/dL — ABNORMAL HIGH (ref 70–99)
Potassium: 4.6 mmol/L (ref 3.5–5.1)
Sodium: 143 mmol/L (ref 135–145)

## 2022-01-15 NOTE — ED Triage Notes (Signed)
Pt reports with urinary retention after having a new foley placed Monday morning. Pt has leaking around the catheter. Pt states that there is a clot blocking the flow.

## 2022-01-15 NOTE — ED Provider Notes (Signed)
[facility]-EMERGENCY DEPT  Provider Note  CSN: [id number] Arrival date & time: 01/15/22 0405  History Chief Complaint  Patient presents with   Urinary Retention    Corey Meyer is a 71 y.o. male with history of chronic foley use has had issues with obstruction and retention recently. Has had several replacements in the last week, including and admission for UTI/pyelo at [facility] in [place], discharged yesterday with a new catheter and Rx for abx. He reports little urine output in the last few hours and increasing suprapubic pain.    Home Medications Prior to Admission medications   Medication Sig Start Date End Date Taking? Authorizing Provider  apixaban (ELIQUIS) 5 MG TABS tablet Take 5 mg by mouth 2 (two) times daily.    [provider]  carvedilol (COREG) 6.25 MG tablet Take 1 tablet (6.25 mg total) by mouth 2 (two) times daily. 01/04/22 01/04/23  Ghimire, Werner Lean, MD  empagliflozin (JARDIANCE) 10 MG TABS tablet Take 10 mg by mouth daily.    [provider]  folic acid (FOLVITE) 1 MG tablet Take 1 mg by mouth daily.    [provider]  furosemide (LASIX) 20 MG tablet Take 1 tablet (20 mg total) by mouth daily. Patient not taking: Reported on 12/31/2021 02/10/19 05/11/19  Toniann Fail, NP  metFORMIN (GLUCOPHAGE) 1000 MG tablet Take 1,000 mg by mouth 2 (two) times daily with a meal.    [provider]  montelukast (SINGULAIR) 10 MG tablet Take 10 mg by mouth at bedtime.    [provider]  omeprazole (PRILOSEC) 20 MG capsule Take 20 mg by mouth daily.    [provider]  potassium chloride SA (KLOR-CON M) 20 MEQ tablet Take 1 tablet (20 mEq total) by mouth daily. 01/04/22   Ghimire, Werner Lean, MD  sildenafil (VIAGRA) 50 MG tablet Take 50 mg by mouth daily as needed for erectile dysfunction.    [provider]  tamsulosin (FLOMAX) 0.4 MG CAPS capsule Take 0.4 mg by mouth at bedtime.     [provider]  thiamine 100 MG tablet Take 100 mg by mouth daily.    [provider]  potassium chloride 20 MEQ TBCR Take 20 mEq by mouth daily. Patient not taking: Reported on 12/31/2021 01/06/19 01/04/22  Yates Decamp, MD     Allergies    Haldol [haloperidol lactate]   Review of Systems   Review of Systems Please see HPI for pertinent positives and negatives  Physical Exam BP 121/79   Pulse 87   Temp 99.1 F (37.3 C)   Resp 20   SpO2 100%   Physical Exam Vitals and nursing note reviewed.  Constitutional:      Appearance: Normal appearance.  HENT:     Head: Normocephalic and atraumatic.     Nose: Nose normal.     Mouth/Throat:     Mouth: Mucous membranes are moist.  Eyes:     Extraocular Movements: Extraocular movements intact.     Conjunctiva/sclera: Conjunctivae normal.  Cardiovascular:     Rate and Rhythm: Normal rate.  Pulmonary:     Effort: Pulmonary effort is normal.     Breath sounds: Normal breath sounds.  Abdominal:     General: Abdomen is flat.     Palpations: Abdomen is soft.     Tenderness: There is no abdominal tenderness (suprapubic).  Musculoskeletal:        General: No swelling. Normal range of motion.  Cervical back: Neck supple.  Skin:    General: Skin is warm and dry.  Neurological:     General: No focal deficit present.     Mental Status: He is alert.  Psychiatric:        Mood and Affect: Mood normal.     ED Results / Procedures / Treatments   EKG None  Procedures Procedures  Medications Ordered in the ED Medications - No data to display  Initial Impression and Plan  Attempts to flush the existing catheter were unsuccessful. RN will attempt catheter replacement, preferably with a larger bore if possible. Labs ordered.   ED Course   Clinical Course as of 01/15/22 0655  Tue Jan 15, 2022  0518 RN able to get a 20Fr foley in place. Draining clear yellow urine now.  [CS]  0542 UA with continued signs of  infection. Already on Keflex from recent admission. Will continue that pending culture.  [CS]  0631 CBC with mild leukocytosis and anemia. [CS]  0648 BMP with mild acute on chronic kidney disease, likely from obstruction now resolved. Recommend he continue his Abx, follow up with Urology as scheduled, will ask RN to demonstrate flushing and give the patient supplies so he can hopefully keep this one clear.  [CS]    Clinical Course User Index [CS] Pollyann Savoy, MD     MDM Rules/Calculators/A&P Medical Decision Making Problems Addressed: Lower urinary tract infection, acute: acute illness or injury Problem with Foley catheter, initial encounter [facility]): acute illness or injury Urinary retention: acute illness or injury  Amount and/or Complexity of Data Reviewed Labs: ordered. Decision-making details documented in ED Course.    Final Clinical Impression(s) / ED Diagnoses Final diagnoses:  Urinary retention  Problem with Foley catheter, initial encounter (HCC)  Lower urinary tract infection, acute    Rx / DC Orders ED Discharge Orders     None        Pollyann Savoy, MD 01/15/22 [phone]

## 2022-01-18 LAB — URINE CULTURE: Culture: 50000 — AB

## 2022-01-19 ENCOUNTER — Telehealth (HOSPITAL_BASED_OUTPATIENT_CLINIC_OR_DEPARTMENT_OTHER): Payer: Self-pay | Admitting: *Deleted

## 2022-01-19 NOTE — Telephone Encounter (Addendum)
Post ED Visit - Positive Culture Follow-up  Culture report reviewed by antimicrobial stewardship pharmacist: [facility] []  , Pharm.D. []  Enzo Bi, Pharm.D., BCPS AQ-ID []  , Pharm.D., BCPS []  Celedonio Miyamoto, Pharm.D., BCPS []  [place], Garvin Fila.D., BCPS, AAHIVP []  , Pharm.D., BCPS, AAHIVP []  [place] Pillion, PharmD, BCPS []  , PharmD, BCPS []  [place], PharmD, BCPS []  [address], PharmD []  , PharmD, BCPS []  Estella Husk, PharmD  Pharmacy Team []  Lysle Pearl, PharmD []  , PharmD []  Phillips Climes, PharmD []  , Rph []  Agapito Games) , PharmD []  Verlan Friends, PharmD []  , PharmD []  Mervyn Gay, PharmD []  , PharmD []  Domingo Sep, PharmD []  [facility], PharmD []  , PharmD [x]  Len Childs, PharmD   Positive urine culture Colonized and no further patient follow-up is required at this time.  01/19/2022, 12:02 PM

## 2022-02-21 ENCOUNTER — Other Ambulatory Visit: Payer: Self-pay | Admitting: Urology

## 2022-02-25 NOTE — Patient Instructions (Signed)
SURGICAL WAITING ROOM VISITATION Patients having surgery or a procedure may have no more than 2 support people in the waiting area - these visitors may rotate.   Children under the age of 19 must have an adult with them who is not the patient. If the patient needs to stay at the hospital during part of their recovery, the visitor guidelines for inpatient rooms apply. Pre-op nurse will coordinate an appropriate time for 1 support person to accompany patient in pre-op.  This support person may not rotate.    Please refer to the [facility] website for the visitor guidelines for Inpatients (after your surgery is over and you are in a regular room).      Your procedure is scheduled on: 03-11-22   Report to [facility] Main Entrance    Report to admitting at 6:45 AM   Call this number if you have problems the morning of surgery [phone]   Do not eat food :After Midnight.   After Midnight you may have the following liquids until 6:00 AM DAY OF SURGERY  Water Non-Citrus Juices (without pulp, NO RED) Carbonated Beverages Black Coffee (NO MILK/CREAM OR CREAMERS, sugar ok)  Clear Tea (NO MILK/CREAM OR CREAMERS, sugar ok) regular and decaf                             Plain Jell-O (NO RED)                                           Fruit ices (not with fruit pulp, NO RED)                                     Popsicles (NO RED)                                                               Sports drinks like Gatorade (NO RED)                     If you have questions, please contact your surgeon's office.   FOLLOW BOWEL PREP AND ANY ADDITIONAL PRE OP INSTRUCTIONS YOU RECEIVED FROM YOUR SURGEON'S OFFICE!!!     Oral Hygiene is also important to reduce your risk of infection.                                    Remember - BRUSH YOUR TEETH THE MORNING OF SURGERY WITH YOUR REGULAR TOOTHPASTE   Do NOT smoke after Midnight  Take these medicines the morning of surgery with A SIP OF WATER:   Carvedilol, Omeprazole, Tamsulosin  How to Manage Your Diabetes Before and After Surgery  Why is it important to control my blood sugar before and after surgery? Improving blood sugar levels before and after surgery helps healing and can limit problems. A way of improving blood sugar control is eating a healthy diet by:  Eating less sugar and carbohydrates  Increasing activity/exercise  Talking with  your doctor about reaching your blood sugar goals High blood sugars (greater than 180 mg/dL) can raise your risk of infections and slow your recovery, so you will need to focus on controlling your diabetes during the weeks before surgery. Make sure that the doctor who takes care of your diabetes knows about your planned surgery including the date and location.  How do I manage my blood sugar before surgery? Check your blood sugar at least 4 times a day, starting 2 days before surgery, to make sure that the level is not too high or low. Check your blood sugar the morning of your surgery when you wake up and every 2 hours until you get to the Short Stay unit. If your blood sugar is less than 70 mg/dL, you will need to treat for low blood sugar: Do not take insulin. Treat a low blood sugar (less than 70 mg/dL) with  cup of clear juice (cranberry or apple), 4 glucose tablets, OR glucose gel. Recheck blood sugar in 15 minutes after treatment (to make sure it is greater than 70 mg/dL). If your blood sugar is not greater than 70 mg/dL on recheck, call [phone] for further instructions. Report your blood sugar to the short stay nurse when you get to Short Stay.  If you are admitted to the hospital after surgery: Your blood sugar will be checked by the staff and you will probably be given insulin after surgery (instead of oral diabetes medicines) to make sure you have good blood sugar levels. The goal for blood sugar control after surgery is 80-180 mg/dL.   WHAT DO I DO ABOUT MY DIABETES  MEDICATION?  Do not take oral diabetes medicines (pills) the morning of surgery.  Hold Jardiance 3 days before surgery  THE DAY BEFORE SURGERY:  Take Metformin as prescribed.   THE MORNING OF SURGERY:  Do not take Jardiance or Metformin   Reviewed and Endorsed by [facility] Patient Education Committee, August 2015   Bring CPAP mask and tubing day of surgery.                              You may not have any metal on your body including  jewelry, and body piercing             Do not wear  lotions, powders, cologne, or deodorant              Men may shave face and neck.   Do not bring valuables to the hospital. [place].   Contacts, dentures or bridgework may not be worn into surgery.   Bring small overnight bag day of surgery.   DO NOT [place]. PHARMACY WILL DISPENSE MEDICATIONS LISTED ON YOUR MEDICATION LIST TO YOU DURING YOUR ADMISSION [place]!  Special Instructions: Bring a copy of your healthcare power of attorney and living will documents the day of surgery if you haven't scanned them before.  Please read over the following fact sheets you were given: IF YOU HAVE QUESTIONS ABOUT YOUR PRE-OP INSTRUCTIONS PLEASE CALL [facility] - Preparing for Surgery Before surgery, you can play an important role.  Because skin is not sterile, your skin needs to be as free of germs as possible.  You can reduce the number of germs on your skin by washing with CHG (chlorahexidine gluconate) soap before surgery.  CHG is an antiseptic cleaner which kills germs and bonds with the skin to continue killing germs even after washing. Please DO NOT use if you have an allergy to CHG or antibacterial soaps.  If your skin becomes reddened/irritated stop using the CHG and inform your nurse when you arrive at Short Stay. Do not shave (including legs and underarms) for at least 48 hours prior to the first  CHG shower.  You may shave your face/neck.  Please follow these instructions carefully:  1.  Shower with CHG Soap the night before surgery and the  morning of surgery.  2.  If you choose to wash your hair, wash your hair first as usual with your normal  shampoo.  3.  After you shampoo, rinse your hair and body thoroughly to remove the shampoo.                             4.  Use CHG as you would any other liquid soap.  You can apply chg directly to the skin and wash.  Gently with a scrungie or clean washcloth.  5.  Apply the CHG Soap to your body ONLY FROM THE NECK DOWN.   Do   not use on face/ open                           Wound or open sores. Avoid contact with eyes, ears mouth and   genitals (private parts).                       Wash face,  Genitals (private parts) with your normal soap.             6.  Wash thoroughly, paying special attention to the area where your    surgery  will be performed.  7.  Thoroughly rinse your body with warm water from the neck down.  8.  DO NOT shower/wash with your normal soap after using and rinsing off the CHG Soap.                9.  Pat yourself dry with a clean towel.            10.  Wear clean pajamas.            11.  Place clean sheets on your bed the night of your first shower and do not  sleep with pets. Day of Surgery : Do not apply any lotions/deodorants the morning of surgery.  Please wear clean clothes to the hospital/surgery center.  FAILURE TO FOLLOW THESE INSTRUCTIONS MAY RESULT IN THE CANCELLATION OF YOUR SURGERY  PATIENT SIGNATURE_________________________________  NURSE SIGNATURE__________________________________  ________________________________________________________________________

## 2022-02-26 ENCOUNTER — Inpatient Hospital Stay (HOSPITAL_COMMUNITY)
Admission: RE | Admit: 2022-02-26 | Discharge: 2022-02-26 | Disposition: A | Discharge status: Home / Self Care | Payer: No Typology Code available for payment source | Source: Ambulatory Visit

## 2022-02-26 DIAGNOSIS — Z01818 Encounter for other preprocedural examination: Secondary | ICD-10-CM

## 2022-02-26 DIAGNOSIS — E119 Type 2 diabetes mellitus without complications: Secondary | ICD-10-CM

## 2022-02-26 DIAGNOSIS — Z789 Other specified health status: Secondary | ICD-10-CM

## 2022-02-26 NOTE — Progress Notes (Signed)
COVID Vaccine Completed:  Date of COVID positive in last 90 days:  PCP - [facility] Cardiologist -   Chest x-ray - 12-30-21 Epic EKG - 12-30-21 Epic Stress Test -  ECHO - 11-12-21 [facility] Cardiac Cath - 2020 Epic Pacemaker/ICD device last checked: Spinal Cord Stimulator:  Bowel Prep -   Sleep Study -  CPAP -   Fasting Blood Sugar -  Checks Blood Sugar _____ times a day  Blood Thinner Instructions:  Eliquis Aspirin Instructions: Last Dose:  Activity level:  Can go up a flight of stairs and perform activities of daily living without stopping and without symptoms of chest pain or shortness of breath.  Able to exercise without symptoms  Unable to go up a flight of stairs without symptoms of     Anesthesia review: Afib, CHF, alcoholish, seizures, HTN, DM  Patient denies shortness of breath, fever, cough and chest pain at PAT appointment  Patient verbalized understanding of instructions that were given to them at the PAT appointment. Patient was also instructed that they will need to review over the PAT instructions again at home before surgery.

## 2022-02-27 ENCOUNTER — Encounter (HOSPITAL_COMMUNITY)
Admission: RE | Admit: 2022-02-27 | Discharge: 2022-02-27 | Disposition: A | Discharge status: Home / Self Care | Payer: No Typology Code available for payment source | Source: Ambulatory Visit | Attending: Urology | Admitting: Urology

## 2022-02-27 ENCOUNTER — Encounter (HOSPITAL_COMMUNITY): Payer: Self-pay

## 2022-02-27 ENCOUNTER — Other Ambulatory Visit: Payer: Self-pay

## 2022-02-27 DIAGNOSIS — Z01812 Encounter for preprocedural laboratory examination: Secondary | ICD-10-CM | POA: Insufficient documentation

## 2022-02-27 DIAGNOSIS — F1020 Alcohol dependence, uncomplicated: Secondary | ICD-10-CM | POA: Diagnosis not present

## 2022-02-27 DIAGNOSIS — B1920 Unspecified viral hepatitis C without hepatic coma: Secondary | ICD-10-CM | POA: Diagnosis not present

## 2022-02-27 DIAGNOSIS — E1122 Type 2 diabetes mellitus with diabetic chronic kidney disease: Secondary | ICD-10-CM | POA: Diagnosis not present

## 2022-02-27 DIAGNOSIS — F17200 Nicotine dependence, unspecified, uncomplicated: Secondary | ICD-10-CM | POA: Diagnosis not present

## 2022-02-27 DIAGNOSIS — Z789 Other specified health status: Secondary | ICD-10-CM | POA: Insufficient documentation

## 2022-02-27 DIAGNOSIS — N400 Enlarged prostate without lower urinary tract symptoms: Secondary | ICD-10-CM | POA: Insufficient documentation

## 2022-02-27 DIAGNOSIS — Z01818 Encounter for other preprocedural examination: Secondary | ICD-10-CM

## 2022-02-27 DIAGNOSIS — N1831 Chronic kidney disease, stage 3a: Secondary | ICD-10-CM | POA: Diagnosis not present

## 2022-02-27 DIAGNOSIS — E119 Type 2 diabetes mellitus without complications: Secondary | ICD-10-CM | POA: Diagnosis not present

## 2022-02-27 DIAGNOSIS — I129 Hypertensive chronic kidney disease with stage 1 through stage 4 chronic kidney disease, or unspecified chronic kidney disease: Secondary | ICD-10-CM | POA: Insufficient documentation

## 2022-02-27 HISTORY — DX: Unspecified viral hepatitis C without hepatic coma: B19.20

## 2022-02-27 HISTORY — DX: Anemia, unspecified: D64.9

## 2022-02-27 HISTORY — DX: Alcohol dependence, uncomplicated: F10.20

## 2022-02-27 LAB — COMPREHENSIVE METABOLIC PANEL
ALT: 11 U/L (ref 0–44)
AST: 19 U/L (ref 15–41)
Albumin: 3.6 g/dL (ref 3.5–5.0)
Alkaline Phosphatase: 74 U/L (ref 38–126)
Anion gap: 12 (ref 5–15)
BUN: 12 mg/dL (ref 8–23)
CO2: 22 mmol/L (ref 22–32)
Calcium: 9.6 mg/dL (ref 8.9–10.3)
Chloride: 107 mmol/L (ref 98–111)
Creatinine, Ser: 1.5 mg/dL — ABNORMAL HIGH (ref 0.61–1.24)
GFR, Estimated: 50 mL/min — ABNORMAL LOW (ref >60)
Glucose, Bld: 137 mg/dL — ABNORMAL HIGH (ref 70–99)
Potassium: 4 mmol/L (ref 3.5–5.1)
Sodium: 141 mmol/L (ref 135–145)
Total Bilirubin: 0.6 mg/dL (ref 0.3–1.2)
Total Protein: 7.8 g/dL (ref 6.5–8.1)

## 2022-02-27 LAB — CBC
HCT: 35.2 % — ABNORMAL LOW (ref 39.0–52.0)
Hemoglobin: 11.1 g/dL — ABNORMAL LOW (ref 13.0–17.0)
MCH: 28.3 pg (ref 26.0–34.0)
MCHC: 31.5 g/dL (ref 30.0–36.0)
MCV: 89.8 fL (ref 80.0–100.0)
Platelets: 314 10*3/uL (ref 150–400)
RBC: 3.92 MIL/uL — ABNORMAL LOW (ref 4.22–5.81)
RDW: 18.3 % — ABNORMAL HIGH (ref 11.5–15.5)
WBC: 7.8 10*3/uL (ref 4.0–10.5)
nRBC: 0 % (ref 0.0–0.2)

## 2022-02-27 LAB — HEMOGLOBIN A1C
Hgb A1c MFr Bld: 7 % — ABNORMAL HIGH (ref 4.8–5.6)
Mean Plasma Glucose: 154.2 mg/dL

## 2022-02-27 LAB — GLUCOSE, CAPILLARY: Glucose-Capillary: 163 mg/dL — ABNORMAL HIGH (ref 70–99)

## 2022-02-27 NOTE — Patient Instructions (Signed)
SURGICAL WAITING ROOM VISITATION Patients having surgery or a procedure may have no more than 2 support people in the waiting area - these visitors may rotate.   Children under the age of 58 must have an adult with them who is not the patient. If the patient needs to stay at the hospital during part of their recovery, the visitor guidelines for inpatient rooms apply. Pre-op nurse will coordinate an appropriate time for 1 support person to accompany patient in pre-op.  This support person may not rotate.    Please refer to the [facility] website for the visitor guidelines for Inpatients (after your surgery is over and you are in a regular room).      Your procedure is scheduled on: 03-11-22   Report to [facility] Main Entrance    Report to admitting at 6:45 AM   Call this number if you have problems the morning of surgery [phone]   Do not eat food :After Midnight.   After Midnight you may have the following liquids until 6:00 AM DAY OF SURGERY  Water Non-Citrus Juices (without pulp, NO RED) Carbonated Beverages Black Coffee (NO MILK/CREAM OR CREAMERS, sugar ok)  Clear Tea (NO MILK/CREAM OR CREAMERS, sugar ok) regular and decaf                             Plain Jell-O (NO RED)                                           Fruit ices (not with fruit pulp, NO RED)                                     Popsicles (NO RED)                                                               Sports drinks like Gatorade (NO RED)                       If you have questions, please contact your surgeon's office.   FOLLOW BOWEL PREP AND ANY ADDITIONAL PRE OP INSTRUCTIONS YOU RECEIVED FROM YOUR SURGEON'S OFFICE!!!     Oral Hygiene is also important to reduce your risk of infection.                                    Remember - BRUSH YOUR TEETH THE MORNING OF SURGERY WITH YOUR REGULAR TOOTHPASTE   Do NOT smoke after Midnight   Take these medicines the morning of surgery with A SIP OF  WATER: Carvedilol, Omeprazole, Tamsulosin   Hold Eliquis per Dr. Shannan Harper instructions  How to Manage Your Diabetes Before and After Surgery  Why is it important to control my blood sugar before and after surgery? Improving blood sugar levels before and after surgery helps healing and can limit problems. A way of improving blood sugar control is eating a healthy diet by:  Eating  less sugar and carbohydrates  Increasing activity/exercise  Talking with your doctor about reaching your blood sugar goals High blood sugars (greater than 180 mg/dL) can raise your risk of infections and slow your recovery, so you will need to focus on controlling your diabetes during the weeks before surgery. Make sure that the doctor who takes care of your diabetes knows about your planned surgery including the date and location.  How do I manage my blood sugar before surgery? Check your blood sugar at least 4 times a day, starting 2 days before surgery, to make sure that the level is not too high or low. Check your blood sugar the morning of your surgery when you wake up and every 2 hours until you get to the Short Stay unit. If your blood sugar is less than 70 mg/dL, you will need to treat for low blood sugar: Do not take insulin. Treat a low blood sugar (less than 70 mg/dL) with  cup of clear juice (cranberry or apple), 4 glucose tablets, OR glucose gel. Recheck blood sugar in 15 minutes after treatment (to make sure it is greater than 70 mg/dL). If your blood sugar is not greater than 70 mg/dL on recheck, call [phone] for further instructions. Report your blood sugar to the short stay nurse when you get to Short Stay.  If you are admitted to the hospital after surgery: Your blood sugar will be checked by the staff and you will probably be given insulin after surgery (instead of oral diabetes medicines) to make sure you have good blood sugar levels. The goal for blood sugar control after surgery is 80-180  mg/dL.   WHAT DO I DO ABOUT MY DIABETES MEDICATION?  Do not take oral diabetes medicines (pills) the morning of surgery.        Hold Jardiance 3 days before surgery  THE DAY BEFORE SURGERY:  Take Metformin as prescribed.                  Do not take evening dose of Glipizide      THE MORNING OF SURGERY:    Do not take diabetes medications   Reviewed and Endorsed by [facility] Patient Education Committee, August 2015                               You may not have any metal on your body including jewelry, and body piercing             Do not wear lotions, powders, cologne, or deodorant              Men may shave face and neck.   Do not bring valuables to the hospital. [facility] IS NOT RESPONSIBLE   FOR VALUABLES.   Contacts, dentures or bridgework may not be worn into surgery.   Bring small overnight bag day of surgery.   DO NOT BRING YOUR HOME MEDICATIONS TO THE HOSPITAL. PHARMACY WILL DISPENSE MEDICATIONS LISTED ON YOUR MEDICATION LIST TO YOU DURING YOUR ADMISSION IN THE HOSPITAL!    Special Instructions: Bring a copy of your healthcare power of attorney and living will documents the day of surgery if you haven't scanned them before.  Please read over the following fact sheets you were given: IF YOU HAVE QUESTIONS ABOUT YOUR PRE-OP INSTRUCTIONS PLEASE CALL [phone] [facility] - Preparing for Surgery Before surgery, you can play an important role.  Because skin  is not sterile, your skin needs to be as free of germs as possible.  You can reduce the number of germs on your skin by washing with CHG (chlorahexidine gluconate) soap before surgery.  CHG is an antiseptic cleaner which kills germs and bonds with the skin to continue killing germs even after washing. Please DO NOT use if you have an allergy to CHG or antibacterial soaps.  If your skin becomes reddened/irritated stop using the CHG and inform your nurse when you arrive at Short Stay. Do not shave (including  legs and underarms) for at least 48 hours prior to the first CHG shower.  You may shave your face/neck.  Please follow these instructions carefully:  1.  Shower with CHG Soap the night before surgery and the  morning of surgery.  2.  If you choose to wash your hair, wash your hair first as usual with your normal  shampoo.  3.  After you shampoo, rinse your hair and body thoroughly to remove the shampoo.                             4.  Use CHG as you would any other liquid soap.  You can apply chg directly to the skin and wash.  Gently with a scrungie or clean washcloth.  5.  Apply the CHG Soap to your body ONLY FROM THE NECK DOWN.   Do   not use on face/ open                           Wound or open sores. Avoid contact with eyes, ears mouth and   genitals (private parts).                       Wash face,  Genitals (private parts) with your normal soap.             6.  Wash thoroughly, paying special attention to the area where your    surgery  will be performed.  7.  Thoroughly rinse your body with warm water from the neck down.  8.  DO NOT shower/wash with your normal soap after using and rinsing off the CHG Soap.                9.  Pat yourself dry with a clean towel.            10.  Wear clean pajamas.            11.  Place clean sheets on your bed the night of your first shower and do not  sleep with pets. Day of Surgery : Do not apply any lotions/deodorants the morning of surgery.  Please wear clean clothes to the hospital/surgery center.  FAILURE TO FOLLOW THESE INSTRUCTIONS MAY RESULT IN THE CANCELLATION OF YOUR SURGERY  PATIENT SIGNATURE_________________________________  NURSE SIGNATURE__________________________________  ________________________________________________________________________

## 2022-02-27 NOTE — Progress Notes (Signed)
Pharmacy came down to review medications but patient did not know what medications he is on.  He was given the pharmacy number to call when he gets home.

## 2022-02-28 ENCOUNTER — Encounter (HOSPITAL_COMMUNITY): Payer: Self-pay | Admitting: Physician Assistant

## 2022-02-28 ENCOUNTER — Encounter (HOSPITAL_COMMUNITY): Payer: Self-pay | Admitting: Anesthesiology

## 2022-02-28 NOTE — Progress Notes (Addendum)
Anesthesia Chart Review   Case: [id number] Date/Time: 03/11/22 0845   Procedure: TRANSURETHRAL RESECTION OF THE PROSTATE (TURP) - 1.5 HRS FOR CASE   Anesthesia type: General   Pre-op diagnosis: BENIGN PROSTATE HYPERPLASIA RETENTION   Location: [facility] PROCEDURE [facility] / [facility]   Surgeons: Crista Elliot, MD       DISCUSSION:71 y.o. smoker with h/o HTN, DM II, Hepatitis C, alcohol dependence, chronic indwelling Foley catheter, CKD Stage IIIa, atrial fibrillation, CHF, BPH scheduled for above procedure 03/11/2022 with Dr. Modena Slater.   Pt with h/o polysubstance abuse (alcohol, cocaine, methamphetamine)-recent cardiac arrest in the setting of polysubstance use (May 2023).  Recent admission 7/2-01/04/2022 with acute encephalopathy in setting of alcohol withdrawal, hypoglycemia, AKI, postictal state.   Previous notes state pt drinks 3 1/2 gallons of vodka a week. Pt reports to PAT nurse he drinks about a fifth (25.36 ounces) of bourbon per day.    Pt was advised to hold Eliquis 2 days prior to procedure.   Received a note from PCP. Per note, "Mr. Corey Meyer was last seen by Dr. Chilton Si on June 21,2023. He takes Eliquis for atrial fibrillation and it is reasonable to stop this prior to his planned surgery and resume it when hemostasis is assured. At the time of his last visit type 2 diabetes was poorly controlled. He was following with cardiology for his atrial fibrillation and congestive heart failure with ejection fraction 35%. He was euvolemic at lats visit. Alcohol intake to excess is a concern and risk of withdrawal would be associated with extended abstinence."  Echo 12/26/2018 with EF 40-45%.  VS: BP (!) 141/95   Pulse (!) 108   Temp 37.2 C (Oral)   Resp 18   Ht 5\' 9"  (1.753 m)   Wt 82.9 kg   SpO2 100%   BMI 26.99 kg/m   PROVIDERS: Clinic,   LABS: Labs reviewed: Acceptable for surgery. (all labs ordered are listed, but only abnormal results are displayed)  Labs  Reviewed  HEMOGLOBIN A1C - Abnormal; Notable for the following components:      Result Value   Hgb A1c MFr Bld 7.0 (*)    All other components within normal limits  COMPREHENSIVE METABOLIC PANEL - Abnormal; Notable for the following components:   Glucose, Bld 137 (*)    Creatinine, Ser 1.50 (*)    GFR, Estimated 50 (*)    All other components within normal limits  CBC - Abnormal; Notable for the following components:   RBC 3.92 (*)    Hemoglobin 11.1 (*)    HCT 35.2 (*)    RDW 18.3 (*)    All other components within normal limits  GLUCOSE, CAPILLARY - Abnormal; Notable for the following components:   Glucose-Capillary 163 (*)    All other components within normal limits     IMAGES:   EKG:   CV: Echo 12/26/2018 1. The left ventricle has mild-moderately reduced systolic function, with  an ejection fraction of 40-45%. The cavity size was mildly dilated. Left  ventricular diastolic parameters were normal. Left ventrical global  hypokinesis without regional wall  motion abnormalities.   2. The right ventricle has normal systolic function. The cavity was  normal. There is no increase in right ventricular wall thickness.   Cardiac Cath 12/25/2018 There is mild to moderate left ventricular systolic dysfunction. The left ventricular ejection fraction is 35-45% by visual estimate. LV end diastolic pressure is severely elevated at 36 mm Hg. Normal coronary  arteries. Findings consistent with dilated cardiomyopathy probably from alcohol and uncontrolled hypertension.   Home tomorrow with BP control, cessation of tobacco and alcohol.  Past Medical History:  Diagnosis Date   Alcoholism (HCC)    Anemia    Controlled diabetes mellitus type II without complication (HCC)    Essential hypertension    Hepatitis C    Hypercholesteremia     Past Surgical History:  Procedure Laterality Date   HEMORRHOID SURGERY     HERNIA REPAIR     KNEE ARTHROPLASTY Left    LEFT HEART CATH AND  CORONARY ANGIOGRAPHY N/A 12/25/2018   Procedure: LEFT HEART CATH AND CORONARY ANGIOGRAPHY;  Surgeon: Yates Decamp, MD;  Location: [facility] INVASIVE CV LAB;  Service: Cardiovascular;  Laterality: N/A;    MEDICATIONS:  apixaban (ELIQUIS) 5 MG TABS tablet   carvedilol (COREG) 6.25 MG tablet   empagliflozin (JARDIANCE) 10 MG TABS tablet   folic acid (FOLVITE) 1 MG tablet   furosemide (LASIX) 20 MG tablet   metFORMIN (GLUCOPHAGE) 1000 MG tablet   montelukast (SINGULAIR) 10 MG tablet   omeprazole (PRILOSEC) 20 MG capsule   potassium chloride SA (KLOR-CON M) 20 MEQ tablet   sildenafil (VIAGRA) 50 MG tablet   tamsulosin (FLOMAX) 0.4 MG CAPS capsule   thiamine 100 MG tablet   No current facility-administered medications for this encounter.    Jodell Cipro Ward, PA-C [facility] Pre-Surgical Testing [phone]

## 2022-02-28 NOTE — Anesthesia Preprocedure Evaluation (Deleted)
Anesthesia Evaluation    Reviewed: Allergy & Precautions, Patient's Chart, lab work & pertinent test results  History of Anesthesia Complications Negative for: history of anesthetic complications  Airway        Dental   Pulmonary Current Smoker,           Cardiovascular hypertension, + dysrhythmias Atrial Fibrillation   Echo 12/26/2018 1. The left ventricle has mild-moderately reduced systolic function, with  an ejection fraction of 40-45%. The cavity size was mildly dilated. Left  ventricular diastolic parameters were normal. Left ventrical global  hypokinesis without regional wall  motion abnormalities.  2. The right ventricle has normal systolic function. The cavity was  normal. There is no increase in right ventricular wall thickness.   Cardiac Cath 12/25/2018 ? There is mild to moderate left ventricular systolic dysfunction. The left ventricular ejection fraction is 35-45% by visual estimate. ? LV end diastolic pressure is severely elevated at 36 mm Hg. ? Normal coronary arteries. ? Findings consistent with dilated cardiomyopathy probably from alcohol and uncontrolled hypertension.   Neuro/Psych negative neurological ROS     GI/Hepatic negative GI ROS, (+)     substance abuse  alcohol use,   Endo/Other  negative endocrine ROSdiabetes  Renal/GU Renal InsufficiencyRenal disease     Musculoskeletal negative musculoskeletal ROS (+)   Abdominal   Peds  Hematology  (+) Blood dyscrasia, anemia ,   Anesthesia Other Findings   Reproductive/Obstetrics                            Anesthesia Physical Anesthesia Plan  ASA: 3  Anesthesia Plan: General   Post-op Pain Management: Tylenol PO (pre-op)*   Induction: Intravenous  PONV Risk Score and Plan: 2 and Ondansetron and Midazolam  Airway Management Planned: LMA  Additional Equipment:   Intra-op Plan:   Post-operative Plan:  Extubation in OR  Informed Consent:   Plan Discussed with: Anesthesiologist  Anesthesia Plan Comments: (Case cancelled  Echo 12/26/2018 with ejection fraction of 40-45%.   Alcohol dependence)    Anesthesia Quick Evaluation

## 2022-03-09 ENCOUNTER — Other Ambulatory Visit: Payer: Self-pay

## 2022-03-09 ENCOUNTER — Emergency Department (HOSPITAL_COMMUNITY): Payer: No Typology Code available for payment source

## 2022-03-09 ENCOUNTER — Inpatient Hospital Stay (HOSPITAL_COMMUNITY)
Admission: EM | Admit: 2022-03-09 | Discharge: 2022-03-11 | DRG: 637 | Disposition: A | Discharge status: Home / Self Care | Payer: No Typology Code available for payment source | Attending: Family Medicine | Admitting: Family Medicine

## 2022-03-09 DIAGNOSIS — Z87898 Personal history of other specified conditions: Secondary | ICD-10-CM

## 2022-03-09 DIAGNOSIS — Z7901 Long term (current) use of anticoagulants: Secondary | ICD-10-CM

## 2022-03-09 DIAGNOSIS — E119 Type 2 diabetes mellitus without complications: Secondary | ICD-10-CM

## 2022-03-09 DIAGNOSIS — N390 Urinary tract infection, site not specified: Secondary | ICD-10-CM

## 2022-03-09 DIAGNOSIS — E162 Hypoglycemia, unspecified: Secondary | ICD-10-CM | POA: Diagnosis not present

## 2022-03-09 DIAGNOSIS — F1090 Alcohol use, unspecified, uncomplicated: Secondary | ICD-10-CM

## 2022-03-09 DIAGNOSIS — E11649 Type 2 diabetes mellitus with hypoglycemia without coma: Principal | ICD-10-CM | POA: Diagnosis present

## 2022-03-09 DIAGNOSIS — I480 Paroxysmal atrial fibrillation: Secondary | ICD-10-CM

## 2022-03-09 DIAGNOSIS — Z8674 Personal history of sudden cardiac arrest: Secondary | ICD-10-CM | POA: Diagnosis not present

## 2022-03-09 DIAGNOSIS — E876 Hypokalemia: Secondary | ICD-10-CM

## 2022-03-09 DIAGNOSIS — Z888 Allergy status to other drugs, medicaments and biological substances status: Secondary | ICD-10-CM

## 2022-03-09 DIAGNOSIS — Z96652 Presence of left artificial knee joint: Secondary | ICD-10-CM | POA: Diagnosis present

## 2022-03-09 DIAGNOSIS — E1122 Type 2 diabetes mellitus with diabetic chronic kidney disease: Secondary | ICD-10-CM | POA: Diagnosis present

## 2022-03-09 DIAGNOSIS — Z789 Other specified health status: Secondary | ICD-10-CM | POA: Diagnosis not present

## 2022-03-09 DIAGNOSIS — I5042 Chronic combined systolic (congestive) and diastolic (congestive) heart failure: Secondary | ICD-10-CM | POA: Diagnosis present

## 2022-03-09 DIAGNOSIS — I5022 Chronic systolic (congestive) heart failure: Secondary | ICD-10-CM | POA: Diagnosis not present

## 2022-03-09 DIAGNOSIS — Y902 Blood alcohol level of 40-59 mg/100 ml: Secondary | ICD-10-CM | POA: Diagnosis present

## 2022-03-09 DIAGNOSIS — G9341 Metabolic encephalopathy: Secondary | ICD-10-CM | POA: Diagnosis present

## 2022-03-09 DIAGNOSIS — F10229 Alcohol dependence with intoxication, unspecified: Secondary | ICD-10-CM | POA: Diagnosis present

## 2022-03-09 DIAGNOSIS — I10 Essential (primary) hypertension: Secondary | ICD-10-CM

## 2022-03-09 DIAGNOSIS — I130 Hypertensive heart and chronic kidney disease with heart failure and stage 1 through stage 4 chronic kidney disease, or unspecified chronic kidney disease: Secondary | ICD-10-CM | POA: Diagnosis present

## 2022-03-09 DIAGNOSIS — F17210 Nicotine dependence, cigarettes, uncomplicated: Secondary | ICD-10-CM | POA: Diagnosis present

## 2022-03-09 DIAGNOSIS — Z79899 Other long term (current) drug therapy: Secondary | ICD-10-CM

## 2022-03-09 DIAGNOSIS — N1831 Chronic kidney disease, stage 3a: Secondary | ICD-10-CM

## 2022-03-09 DIAGNOSIS — E7800 Pure hypercholesterolemia, unspecified: Secondary | ICD-10-CM | POA: Diagnosis present

## 2022-03-09 DIAGNOSIS — Z7984 Long term (current) use of oral hypoglycemic drugs: Secondary | ICD-10-CM

## 2022-03-09 DIAGNOSIS — N3000 Acute cystitis without hematuria: Secondary | ICD-10-CM

## 2022-03-09 HISTORY — DX: Acute myocardial infarction, unspecified: I21.9

## 2022-03-09 LAB — MAGNESIUM: Magnesium: 1.6 mg/dL — ABNORMAL LOW (ref 1.7–2.4)

## 2022-03-09 LAB — COMPREHENSIVE METABOLIC PANEL WITH GFR
ALT: 13 U/L (ref 0–44)
AST: 26 U/L (ref 15–41)
Albumin: 3.7 g/dL (ref 3.5–5.0)
Alkaline Phosphatase: 77 U/L (ref 38–126)
Anion gap: 10 (ref 5–15)
BUN: 12 mg/dL (ref 8–23)
CO2: 24 mmol/L (ref 22–32)
Calcium: 9.8 mg/dL (ref 8.9–10.3)
Chloride: 105 mmol/L (ref 98–111)
Creatinine, Ser: 1.24 mg/dL (ref 0.61–1.24)
GFR, Estimated: >60 mL/min
Glucose, Bld: 149 mg/dL — ABNORMAL HIGH (ref 70–99)
Potassium: 3.1 mmol/L — ABNORMAL LOW (ref 3.5–5.1)
Sodium: 139 mmol/L (ref 135–145)
Total Bilirubin: 0.4 mg/dL (ref 0.3–1.2)
Total Protein: 8 g/dL (ref 6.5–8.1)

## 2022-03-09 LAB — URINALYSIS, ROUTINE W REFLEX MICROSCOPIC
Bilirubin Urine: NEGATIVE
Glucose, UA: >=500 mg/dL — AB
Ketones, ur: NEGATIVE mg/dL
Nitrite: POSITIVE — AB
Protein, ur: 30 mg/dL — AB
Specific Gravity, Urine: 1.006 (ref 1.005–1.030)
WBC, UA: >50 WBC/hpf — ABNORMAL HIGH (ref 0–5)
pH: 6 (ref 5.0–8.0)

## 2022-03-09 LAB — CBC WITH DIFFERENTIAL/PLATELET
Abs Immature Granulocytes: 0.03 10*3/uL (ref 0.00–0.07)
Basophils Absolute: 0 10*3/uL (ref 0.0–0.1)
Basophils Relative: 0 %
Eosinophils Absolute: 0.1 10*3/uL (ref 0.0–0.5)
Eosinophils Relative: 1 %
HCT: 36 % — ABNORMAL LOW (ref 39.0–52.0)
Hemoglobin: 11.5 g/dL — ABNORMAL LOW (ref 13.0–17.0)
Immature Granulocytes: 0 %
Lymphocytes Relative: 18 %
Lymphs Abs: 1.4 10*3/uL (ref 0.7–4.0)
MCH: 28.9 pg (ref 26.0–34.0)
MCHC: 31.9 g/dL (ref 30.0–36.0)
MCV: 90.5 fL (ref 80.0–100.0)
Monocytes Absolute: 0.6 10*3/uL (ref 0.1–1.0)
Monocytes Relative: 7 %
Neutro Abs: 6 10*3/uL (ref 1.7–7.7)
Neutrophils Relative %: 74 %
Platelets: 282 10*3/uL (ref 150–400)
RBC: 3.98 MIL/uL — ABNORMAL LOW (ref 4.22–5.81)
RDW: 19.3 % — ABNORMAL HIGH (ref 11.5–15.5)
WBC: 8.1 10*3/uL (ref 4.0–10.5)
nRBC: 0 % (ref 0.0–0.2)

## 2022-03-09 LAB — ETHANOL: Alcohol, Ethyl (B): 48 mg/dL — ABNORMAL HIGH

## 2022-03-09 LAB — RAPID URINE DRUG SCREEN, HOSP PERFORMED
Amphetamines: NOT DETECTED
Barbiturates: NOT DETECTED
Benzodiazepines: NOT DETECTED
Cocaine: NOT DETECTED
Opiates: NOT DETECTED
Tetrahydrocannabinol: NOT DETECTED

## 2022-03-09 LAB — CBG MONITORING, ED
Glucose-Capillary: 132 mg/dL — ABNORMAL HIGH (ref 70–99)
Glucose-Capillary: 123 mg/dL — ABNORMAL HIGH (ref 70–99)

## 2022-03-09 MED ORDER — ADULT MULTIVITAMIN W/MINERALS CH
1.0000 | ORAL_TABLET | Freq: Every day | ORAL | Status: DC
  Administered 2022-03-10 – 2022-03-11 (×2): 1 via ORAL
  Filled 2022-03-09 (×2): qty 1

## 2022-03-09 MED ORDER — THIAMINE HCL 100 MG/ML IJ SOLN: 100.0000 mg | Freq: Every day | INTRAMUSCULAR | Status: DC

## 2022-03-09 MED ORDER — THIAMINE MONONITRATE 100 MG PO TABS
100.0000 mg | ORAL_TABLET | Freq: Every day | ORAL | Status: DC
  Administered 2022-03-10 – 2022-03-11 (×2): 100 mg via ORAL
  Filled 2022-03-09 (×2): qty 1

## 2022-03-09 MED ORDER — SODIUM CHLORIDE 0.9% FLUSH
3.0000 mL | Freq: Two times a day (BID) | INTRAVENOUS | Status: DC
  Administered 2022-03-09 – 2022-03-10 (×3): 3 mL via INTRAVENOUS

## 2022-03-09 MED ORDER — LORAZEPAM 2 MG/ML IJ SOLN: 1.0000 mg | INTRAMUSCULAR | Status: DC | PRN

## 2022-03-09 MED ORDER — SODIUM CHLORIDE 0.9 % IV SOLN
1.0000 g | Freq: Once | INTRAVENOUS | Status: AC
Start: 2022-03-09 — End: 2022-03-09
  Administered 2022-03-09: 1 g via INTRAVENOUS
  Filled 2022-03-09: qty 10

## 2022-03-09 MED ORDER — SODIUM CHLORIDE 0.9 % IV SOLN: 250.0000 mL | INTRAVENOUS | Status: DC | PRN

## 2022-03-09 MED ORDER — POTASSIUM CHLORIDE CRYS ER 20 MEQ PO TBCR
40.0000 meq | EXTENDED_RELEASE_TABLET | Freq: Once | ORAL | Status: AC
Start: 2022-03-09 — End: 2022-03-09
  Administered 2022-03-09: 40 meq via ORAL
  Filled 2022-03-09: qty 2

## 2022-03-09 MED ORDER — SODIUM CHLORIDE 0.9% FLUSH: 3.0000 mL | INTRAVENOUS | Status: DC | PRN

## 2022-03-09 MED ORDER — FOLIC ACID 1 MG PO TABS
1.0000 mg | ORAL_TABLET | Freq: Every day | ORAL | Status: DC
  Administered 2022-03-10 – 2022-03-11 (×2): 1 mg via ORAL
  Filled 2022-03-09 (×2): qty 1

## 2022-03-09 MED ORDER — LORAZEPAM 1 MG PO TABS: 1.0000 mg | ORAL_TABLET | ORAL | Status: DC | PRN

## 2022-03-09 NOTE — ED Provider Notes (Signed)
[facility] Note   CSN: [id number] Arrival date & time: 03/09/22  2027     History  Chief Complaint  Patient presents with   Hypoglycemia    Nikoloz Huy is a 71 y.o. male, hx of diabetes type II, who presents to the ED via EMS after being found by his roommate foaming at the mouth.  Per EMS, patient's roommate states that he checked on the patient around 2 PM today and he was sleeping, checked on him a few hours later, he was frothing at the mouth, not responding, so he called EMS.  EMS states that his sugar was 21 on arrival, and they gave him D10, which increased the blood sugar to 131.  Notes that he is alert and oriented now, and had been drinking earlier today.  States that he had a full glass of liquor, does not know how much however.  States he drinks 3 times a week, and typically drinks heavy liquor.  Denies any chest pain, shortness of breath, nausea, vomiting.  Is on warfarin, unknown trauma.  Does not remember what happened before the EMS gave him the glucose.   Hypoglycemia      Home Medications Prior to Admission medications   Medication Sig Start Date End Date Taking? Authorizing Provider  apixaban (ELIQUIS) 5 MG TABS tablet Take 5 mg by mouth 2 (two) times daily.    [provider]  carvedilol (COREG) 6.25 MG tablet Take 1 tablet (6.25 mg total) by mouth 2 (two) times daily. 01/04/22 01/04/23  Ghimire, Werner Lean, MD  empagliflozin (JARDIANCE) 10 MG TABS tablet Take 10 mg by mouth daily.    [provider]  folic acid (FOLVITE) 1 MG tablet Take 1 mg by mouth daily.    [provider]  furosemide (LASIX) 20 MG tablet Take 1 tablet (20 mg total) by mouth daily. Patient not taking: Reported on 12/31/2021 02/10/19 05/11/19  Toniann Fail, NP  metFORMIN (GLUCOPHAGE) 1000 MG tablet Take 1,000 mg by mouth 2 (two) times daily with a meal.    [provider]  montelukast (SINGULAIR) 10 MG tablet  Take 10 mg by mouth at bedtime.    [provider]  omeprazole (PRILOSEC) 20 MG capsule Take 20 mg by mouth daily.    [provider]  potassium chloride SA (KLOR-CON M) 20 MEQ tablet Take 1 tablet (20 mEq total) by mouth daily. 01/04/22   Ghimire, Werner Lean, MD  sildenafil (VIAGRA) 50 MG tablet Take 50 mg by mouth daily as needed for erectile dysfunction.    [provider]  tamsulosin (FLOMAX) 0.4 MG CAPS capsule Take 0.4 mg by mouth at bedtime.    [provider]  thiamine 100 MG tablet Take 100 mg by mouth daily.    [provider]  potassium chloride 20 MEQ TBCR Take 20 mEq by mouth daily. Patient not taking: Reported on 12/31/2021 01/06/19 01/04/22  Yates Decamp, MD      Allergies    Haldol [haloperidol lactate]    Review of Systems   Review of Systems  All other systems reviewed and are negative.   Physical Exam Updated Vital Signs BP (!) 158/110   Pulse (!) 105   Temp 99.6 F (37.6 C) (Oral)   Resp 16   Ht 5\' 9"  (1.753 m)   Wt 90.7 kg   SpO2 100%   BMI 29.53 kg/m  Physical Exam Vitals and nursing note reviewed.  Constitutional:  Appearance: Normal appearance.     Comments: Smells of urine  HENT:     Head: Normocephalic and atraumatic.     Right Ear: Tympanic membrane normal.     Left Ear: Tympanic membrane normal.     Nose: Nose normal.     Mouth/Throat:     Mouth: Mucous membranes are moist.  Eyes:     Extraocular Movements: Extraocular movements intact.     Conjunctiva/sclera: Conjunctivae normal.     Pupils: Pupils are equal, round, and reactive to light.  Cardiovascular:     Rate and Rhythm: Normal rate and regular rhythm.  Pulmonary:     Effort: Pulmonary effort is normal.     Comments: +crackles in RLL Abdominal:     General: Abdomen is flat. Bowel sounds are normal.     Palpations: Abdomen is soft.  Musculoskeletal:        General: Normal range of motion.     Cervical back: Normal range of motion and neck  supple.  Skin:    General: Skin is warm and dry.     Capillary Refill: Capillary refill takes less than 2 seconds.  Neurological:     General: No focal deficit present.     Mental Status: He is alert and oriented to person, place, and time.  Psychiatric:        Mood and Affect: Mood normal.        Thought Content: Thought content normal.     ED Results / Procedures / Treatments   Labs (all labs ordered are listed, but only abnormal results are displayed) Labs Reviewed  CBC WITH DIFFERENTIAL/PLATELET - Abnormal; Notable for the following components:      Result Value   RBC 3.98 (*)    Hemoglobin 11.5 (*)    HCT 36.0 (*)    RDW 19.3 (*)    All other components within normal limits  COMPREHENSIVE METABOLIC PANEL - Abnormal; Notable for the following components:   Potassium 3.1 (*)    Glucose, Bld 149 (*)    All other components within normal limits  ETHANOL - Abnormal; Notable for the following components:   Alcohol, Ethyl (B) 48 (*)    All other components within normal limits  URINALYSIS, ROUTINE W REFLEX MICROSCOPIC - Abnormal; Notable for the following components:   APPearance CLOUDY (*)    Glucose, UA >=500 (*)    Hgb urine dipstick LARGE (*)    Protein, ur 30 (*)    Nitrite POSITIVE (*)    Leukocytes,Ua LARGE (*)    WBC, UA >50 (*)    Bacteria, UA RARE (*)    All other components within normal limits  CBG MONITORING, ED - Abnormal; Notable for the following components:   Glucose-Capillary 132 (*)    All other components within normal limits  CBG MONITORING, ED - Abnormal; Notable for the following components:   Glucose-Capillary 123 (*)    All other components within normal limits  URINE CULTURE  RAPID URINE DRUG SCREEN, HOSP PERFORMED  MAGNESIUM  CBG MONITORING, ED    EKG None  Radiology CT Head Wo Contrast  Result Date: 03/09/2022 CLINICAL DATA:  Seizure-like activity EXAM: CT HEAD WITHOUT CONTRAST TECHNIQUE: Contiguous axial images were obtained from  the base of the skull through the vertex without intravenous contrast. RADIATION DOSE REDUCTION: This exam was performed according to the departmental dose-optimization program which includes automated exposure control, adjustment of the mA and/or kV according to patient size and/or use of iterative  reconstruction technique. COMPARISON:  12/30/2021 FINDINGS: Brain: No evidence of acute infarction, hemorrhage, hydrocephalus, extra-axial collection or mass lesion/mass effect. Mild subcortical white matter and periventricular Litzi Binning vessel ischemic changes. Vascular: No hyperdense vessel or unexpected calcification. Skull: Normal. Negative for fracture or focal lesion. Sinuses/Orbits: Mild mucosal thickening of the left maxillary sinus. Visualized paranasal sinuses and mastoid air cells are otherwise clear. Other: None. IMPRESSION: No acute intracranial abnormality. Mild Joli Koob vessel ischemic changes. Electronically Signed   By: Charline Bills M.D.   On: 03/09/2022 21:20   DG Chest 2 View  Result Date: 03/09/2022 CLINICAL DATA:  Possible aspiration EXAM: CHEST - 2 VIEW COMPARISON:  12/30/2021 FINDINGS: Lungs are clear.  No pleural effusion or pneumothorax. The heart is normal in size. Visualized osseous structures are within normal limits. Shrapnel overlying the upper chest. IMPRESSION: Normal chest radiographs. Electronically Signed   By: Charline Bills M.D.   On: 03/09/2022 21:19    Procedures Procedures    Medications Ordered in ED Medications  sodium chloride flush (NS) 0.9 % injection 3 mL (3 mLs Intravenous Given 03/09/22 2246)  sodium chloride flush (NS) 0.9 % injection 3 mL (has no administration in time range)  0.9 %  sodium chloride infusion (has no administration in time range)  potassium chloride SA (KLOR-CON M) CR tablet 40 mEq (has no administration in time range)  cefTRIAXone (ROCEPHIN) 1 g in sodium chloride 0.9 % 100 mL IVPB (1 g Intravenous New Bag/Given 03/09/22 2236)    ED Course/  Medical Decision Making/ A&P Clinical Course as of 03/09/22 2326  Sat Mar 09, 2022  2156 Bacteria, UA(!): RARE [BS]    Clinical Course User Index [BS] Lucrezia Dehne, Harley Alto, PA                           Medical Decision Making Amount and/or Complexity of Data Reviewed Labs: ordered. Decision-making details documented in ED Course. Radiology: ordered.  Risk Prescription drug management. Decision regarding hospitalization.   Patient is a 71 year old male, history of type 2 diabetes, who presents to the ED secondary to being found foaming at the mouth by his roommate.  He was found to be hypoglycemic with a glucose of 21.  He was given D10 by EMS, transported to the ER.  Upon evaluation, EKG within normal limits, labs fairly unremarkable except for mild hypokalemia of 3.1.  UA was positive for nitrates and leuk asked, with rare bacteria.  This may be a catheter associated UTI.  Ceftriaxone was administered.  Additionally his ethanol was 48.  He states that he was drinking this a.m.  And drinks around 3 times a week.  He is on Jardiance, and there is concerned that his glucose may drop further throughout the night, especially if he continues to drink.  He is admitted to the hospitalist Dr. Cyndia Bent, who accepts admission.  I relayed my concerns for possible aspiration pneumonia even though there checks x-ray was negative, he had some crackles in his right lower lobe.  Additionally the frothing at the mouth, alcohol use, and hypoglycemia puts him at high risk.  He is a poor historian in general secondary to the hypoglycemia.  Head CT was obtained secondary to concern for falls given his use of alcohol, and poor history, CT was negative for any kind of bleed, of note patient is on warfarin.  Admitted to hospitalist Dr. Cyndia Bent.   Final Clinical Impression(s) / ED Diagnoses Final diagnoses:  Hypoglycemia  Urinary  tract infection associated with catheterization of urinary tract, unspecified indwelling urinary  catheter type, initial encounter [facility])    Rx / DC Orders ED Discharge Orders     None         Hitomi Slape Carlean Jews, PA 03/09/22 2331    Lennice Sites, DO 03/09/22 2333

## 2022-03-09 NOTE — Assessment & Plan Note (Signed)
Continue home antihypertensives 

## 2022-03-09 NOTE — Assessment & Plan Note (Signed)
Present on admission.   Urinalysis taken from Foley with pyuria. - Continue Rocephin for now - Foley exchanged - Discussed with Dr. Alvester Morin tomorrow

## 2022-03-09 NOTE — Assessment & Plan Note (Signed)
In the setting of drug use-this occurred in May 2023-was hospitalized at [facility].

## 2022-03-09 NOTE — H&P (Incomplete)
History and Physical    Patient: Corey Meyer [id number] DOB: [birth year] DOA: 03/09/2022 DOS: the patient was seen and examined on 03/09/2022 PCP: [facility]  Patient coming from: Home  Chief Complaint:  Chief Complaint  Patient presents with   Hypoglycemia   HPI: Corey Meyer is a 71 y.o. male with medical history significant of  polysubstance abuse (alcohol, cocaine, methamphetamine)-recent cardiac arrest in the setting of polysubstance use (May 2023), combined systolic and diastolic CHF, chronic indwelling Foley catheter, DM-2, HTN, CKD stage IIIa, HLD, A-fib who presents with AMS  and hypoglycemia.  Reportedly roommate called EMS since he was unresponsive with white foam coming out of mouth. CBG 21 and given D10 and glucagon on arrival with improvement to 113.  ETOH level at 48. UDS negative.  UA grossly positive with +leukocyte, +nitrate and >50 WBC.   No leukocytosis. Mild hypokalemia of 3.1. Normal creatinine. Normal LFTs. CT head negative for acute process. CXR negative.    He was started on IV Rocephin and hospitalist consulted for admission.   Review of Systems: {ROS_Text:26778} Past Medical History:  Diagnosis Date   Alcoholism (HCC)    Anemia    Controlled diabetes mellitus type II without complication (HCC)    Essential hypertension    Hepatitis C    Hypercholesteremia    Past Surgical History:  Procedure Laterality Date   HEMORRHOID SURGERY     HERNIA REPAIR     KNEE ARTHROPLASTY Left    LEFT HEART CATH AND CORONARY ANGIOGRAPHY N/A 12/25/2018   Procedure: LEFT HEART CATH AND CORONARY ANGIOGRAPHY;  Surgeon: Yates Decamp, MD;  Location: [facility];  Service: Cardiovascular;  Laterality: N/A;   Social History:  reports that he has been smoking cigarettes. He has a 30.00 pack-year smoking history. He has never used smokeless tobacco. He reports current alcohol use of about 6.0 standard drinks of alcohol per week. He reports current drug use.  Drug: Cocaine.  Allergies  Allergen Reactions   Haldol [Haloperidol Lactate] Swelling and Other (See Comments)    Xerostomia    No family history on file.  Prior to Admission medications   Medication Sig Start Date End Date Taking? Authorizing Provider  apixaban (ELIQUIS) 5 MG TABS tablet Take 5 mg by mouth 2 (two) times daily.    [provider]  carvedilol (COREG) 6.25 MG tablet Take 1 tablet (6.25 mg total) by mouth 2 (two) times daily. 01/04/22 01/04/23  Ghimire, Werner Lean, MD  empagliflozin (JARDIANCE) 10 MG TABS tablet Take 10 mg by mouth daily.    [provider]  folic acid (FOLVITE) 1 MG tablet Take 1 mg by mouth daily.    [provider]  furosemide (LASIX) 20 MG tablet Take 1 tablet (20 mg total) by mouth daily. Patient not taking: Reported on 12/31/2021 02/10/19 05/11/19  Toniann Fail, NP  metFORMIN (GLUCOPHAGE) 1000 MG tablet Take 1,000 mg by mouth 2 (two) times daily with a meal.    [provider]  montelukast (SINGULAIR) 10 MG tablet Take 10 mg by mouth at bedtime.    [provider]  omeprazole (PRILOSEC) 20 MG capsule Take 20 mg by mouth daily.    [provider]  potassium chloride SA (KLOR-CON M) 20 MEQ tablet Take 1 tablet (20 mEq total) by mouth daily. 01/04/22   Ghimire, Werner Lean, MD  sildenafil (VIAGRA) 50 MG tablet Take 50 mg by mouth daily as needed for erectile dysfunction.    [provider]  tamsulosin (FLOMAX) 0.4 MG CAPS capsule Take 0.4 mg by mouth at bedtime.    [provider]  thiamine 100 MG tablet Take 100 mg by mouth daily.    [provider]  potassium chloride 20 MEQ TBCR Take 20 mEq by mouth daily. Patient not taking: Reported on 12/31/2021 01/06/19 01/04/22  Yates Decamp, MD    Physical Exam: Vitals:   03/09/22 2120 03/09/22 2145 03/09/22 2200 03/09/22 2230  BP: (!) 150/110 (!) 153/112 (!) 170/110 (!) 155/116  Pulse: (!) 107 (!) 106 (!) 108 (!) 106  Resp: (!) 22 (!) 22  (!) 22 11  Temp:      TempSrc:      SpO2: 98% 98% 100% 100%  Weight:      Height:       *** Data Reviewed: {Tip this will not be part of the note when signed- Document your independent interpretation of telemetry tracing, EKG, lab, Radiology test or any other diagnostic tests. Add any new diagnostic test ordered today. (Optional):26781} {Results:26384}  Assessment and Plan: No notes have been filed under this hospital service. Service: Hospitalist     Advance Care Planning:   Code Status: Prior ***  Consults: ***  Family Communication: ***  Severity of Illness: {Observation/Inpatient:21159}  Author: Anselm Jungling, DO 03/09/2022 10:56 PM  For on call review [email or web address].

## 2022-03-09 NOTE — Assessment & Plan Note (Signed)
Replete with oral K. Check Mg and replete as needed

## 2022-03-09 NOTE — Assessment & Plan Note (Addendum)
CBG 21 when found by EMS improved with D10 and glucagon.  Patient reported to nursing his glucose was down because he'd been "drinking for several days, not eating like he should". Glucose today up to 200.  A1c 7% - Stop dextrose infusion - PO diet - Hold Jardiance, glipizide, and metformin - Start sliding scale corrections

## 2022-03-09 NOTE — ED Triage Notes (Signed)
BIBA from home roommate called stating he was sleeping a lot saw whit foam come out of mouth patient did not respond so friend called EMS, CBG 21 on arrival given D10 12gr and glucagon CBG came up to 113 18 LAC

## 2022-03-09 NOTE — Assessment & Plan Note (Signed)
Creatinine is stable.

## 2022-03-09 NOTE — Assessment & Plan Note (Addendum)
Intoxicated on arrival.   PAWSS 7, high, history seizure and encephalopathy in setting of hypoglycemia and withdrawal 2 mon ths ago July 2023 admission under similar circumstances.  At present without any symptmos of withdrawal or malaise, feels well, good appetite, oriented.  - Continue CIWA with on demand lorazepam - Continue thiamine and folate

## 2022-03-09 NOTE — Assessment & Plan Note (Signed)
Patient was unresponsive on activation of EMS.  This was due to hypoglycemia.  Likely also intoxication.

## 2022-03-09 NOTE — H&P (Incomplete)
History and Physical    Patient: Corey Meyer [id number] DOB: Nov 27, 1950 DOA: 03/09/2022 DOS: the patient was seen and examined on 03/10/2022 PCP: [facility]  Patient coming from: Home  Chief Complaint:  Chief Complaint  Patient presents with  . Hypoglycemia   HPI: Corey Meyer is a 71 y.o. male with medical history significant of  polysubstance abuse (alcohol, cocaine, methamphetamine)-recent cardiac arrest in the setting of polysubstance use (May 2023), combined systolic and diastolic CHF, chronic indwelling Foley catheter, DM-2, HTN, CKD stage IIIa, HLD, A-fib who presents with AMS  and hypoglycemia.  Reportedly roommate called EMS since he was unresponsive with white foam coming out of mouth. Pt only remembers drinking Bourbon this morning and laying around. Did not eat today. Unable to recall much other hx. States he took some of his medications yesterday but cannot named them. Then says he has been out of some of his medications. Also use tobacco about a pack daily. Reports quitting drugs a long time ago.  Denies any headache, chest pain, shortness of breath. No nausea, vomiting, diarrhea. No bowel incontinent.   In the field, CBG 21 and given D10 and glucagon on arrival with improvement to 113.  ETOH level at 48. UDS negative.  UA grossly positive with +leukocyte, +nitrate and >50 WBC.   No leukocytosis. Mild hypokalemia of 3.1. Normal creatinine. Normal LFTs. CT head negative for acute process. CXR negative.    He was started on IV Rocephin and hospitalist consulted for admission.   Review of Systems: As mentioned in the history of present illness. All other systems reviewed and are negative. Past Medical History:  Diagnosis Date  . Alcoholism ([place])   . Anemia   . Controlled diabetes mellitus type II without complication ([place])   . Essential hypertension   . Hepatitis C   . Hypercholesteremia    Past Surgical History:  Procedure Laterality Date  .  HEMORRHOID SURGERY    . HERNIA REPAIR    . KNEE ARTHROPLASTY Left   . LEFT HEART CATH AND CORONARY ANGIOGRAPHY N/A 12/25/2018   Procedure: LEFT HEART CATH AND CORONARY ANGIOGRAPHY;  Surgeon: Adrian Prows, MD;  Location: [place] CV LAB;  Service: Cardiovascular;  Laterality: N/A;   Social History:  reports that he has been smoking cigarettes. He has a 30.00 pack-year smoking history. He has never used smokeless tobacco. He reports current alcohol use of about 6.0 standard drinks of alcohol per week. He reports current drug use. Drug: Cocaine.  Allergies  Allergen Reactions  . Haldol [Haloperidol Lactate] Swelling and Other (See Comments)    Xerostomia    No family history on file.  Prior to Admission medications   Medication Sig Start Date End Date Taking? Authorizing Provider  apixaban (ELIQUIS) 5 MG TABS tablet Take 5 mg by mouth 2 (two) times daily.    [provider]  carvedilol (COREG) 6.25 MG tablet Take 1 tablet (6.25 mg total) by mouth 2 (two) times daily. 01/04/22 01/04/23  Ghimire, Henreitta Leber, MD  empagliflozin (JARDIANCE) 10 MG TABS tablet Take 10 mg by mouth daily.    [provider]  folic acid (FOLVITE) 1 MG tablet Take 1 mg by mouth daily.    [provider]  furosemide (LASIX) 20 MG tablet Take 1 tablet (20 mg total) by mouth daily. Patient not taking: Reported on 12/31/2021 02/10/19 05/11/19  Miquel Dunn, NP  metFORMIN (GLUCOPHAGE) 1000 MG tablet Take 1,000 mg by mouth 2 (two) times daily with a  meal.    [provider]  montelukast (SINGULAIR) 10 MG tablet Take 10 mg by mouth at bedtime.    [provider]  omeprazole (PRILOSEC) 20 MG capsule Take 20 mg by mouth daily.    [provider]  potassium chloride SA (KLOR-CON M) 20 MEQ tablet Take 1 tablet (20 mEq total) by mouth daily. 01/04/22   Ghimire, Werner Lean, MD  sildenafil (VIAGRA) 50 MG tablet Take 50 mg by mouth daily as needed for erectile dysfunction.     [provider]  tamsulosin (FLOMAX) 0.4 MG CAPS capsule Take 0.4 mg by mouth at bedtime.    [provider]  thiamine 100 MG tablet Take 100 mg by mouth daily.    [provider]  potassium chloride 20 MEQ TBCR Take 20 mEq by mouth daily. Patient not taking: Reported on 12/31/2021 01/06/19 01/04/22  Yates Decamp, MD    Physical Exam: Vitals:   03/09/22 2145 03/09/22 2200 03/09/22 2230 03/09/22 2300  BP: (!) 153/112 (!) 170/110 (!) 155/116 (!) 158/110  Pulse: (!) 106 (!) 108 (!) 106 (!) 105  Resp: (!) 22 (!) 22 11 16   Temp:      TempSrc:      SpO2: 98% 100% 100% 100%  Weight:      Height:       Constitutional: NAD, calm, comfortable, elderly male laying upright in bed. Frequent yawning.  Eyes: lids and conjunctivae normal ENMT: Mucous membranes are moist. Neck: normal, supple,  Respiratory: clear to auscultation bilaterally, no wheezing, no crackles. Normal respiratory effort. No accessory muscle use.  Cardiovascular: Regular rate and rhythm, no murmurs / rubs / gallops. No extremity edema.  Abdomen:, Soft, nondistended no tenderness,. Bowel sounds positive.  Musculoskeletal: no clubbing / cyanosis. No joint deformity upper and lower extremities. Good ROM, no contractures. Normal muscle tone.  Skin: no rashes, lesions, ulcers.  Neurologic: CN 2-12 grossly intact. Sensation intact, Strength 5/5 in all 4.  Psychiatric: Normal judgment and insight. Alert and oriented x 3. Normal mood. Data Reviewed: {Tip this will not be part of the note when signed- Document your independent interpretation of telemetry tracing, EKG, lab, Radiology test or any other diagnostic tests. Add any new diagnostic test ordered today. (Optional):26781} See HPI  Assessment and Plan: * Acute metabolic encephalopathy Likely due to alcohol use and UTI. UDS negative. Treatment with antibiotics as below.  Chronic systolic CHF (congestive heart failure) (HCC) Compensated. Continue  Coreg.  History of cardiac arrest In the setting of drug use-this occurred in May 2023-was hospitalized at [facility].  Paroxysmal atrial fibrillation (HCC) Continue on beta-blocker and Eliquis. Reports cessation of cocaine and UDS is negative today.  HTN (hypertension) Continue home antihypertensives   Stage 3a chronic kidney disease (CKD) (HCC) Creatinine is stable.  Alcohol use ETOH level of 48. Reports last drink this morning. -place on CIWA  Hypokalemia Replete with oral K. Check Mg and replete as needed  UTI (urinary tract infection) Has urinary retention with chronic foley catheter.  -Continue IV Rocephin pending urine culture -has scheduled TURP with urology [facility] on 03/11/22  Hypoglycemia associated with diabetes (HCC) CBG 21 when found by EMS improved with D10 and glucagon.  -continue check CBG q4hr. Recently admitted for the same in July and had discontinuation of glipizide. Has Jardiance on med list but unclear if he is compliant. Last HbA1C in August of 7.       Advance Care Planning:   Code Status: Full Code Full  Consults: none  Family Communication: none at bedside  Severity of Illness: The appropriate patient status for this patient is OBSERVATION. Observation status is judged to be reasonable and necessary in order to provide the required intensity of service to ensure the patient's safety. The patient's presenting symptoms, physical exam findings, and initial radiographic and laboratory data in the context of their medical condition is felt to place them at decreased risk for further clinical deterioration. Furthermore, it is anticipated that the patient will be medically stable for discharge from the hospital within 2 midnights of admission.   Author: Anselm Jungling, DO 03/10/2022 12:01 AM  For on call review [email or web address].

## 2022-03-10 DIAGNOSIS — I130 Hypertensive heart and chronic kidney disease with heart failure and stage 1 through stage 4 chronic kidney disease, or unspecified chronic kidney disease: Secondary | ICD-10-CM | POA: Diagnosis present

## 2022-03-10 DIAGNOSIS — E11649 Type 2 diabetes mellitus with hypoglycemia without coma: Secondary | ICD-10-CM | POA: Diagnosis present

## 2022-03-10 DIAGNOSIS — I5022 Chronic systolic (congestive) heart failure: Secondary | ICD-10-CM | POA: Diagnosis not present

## 2022-03-10 DIAGNOSIS — E1122 Type 2 diabetes mellitus with diabetic chronic kidney disease: Secondary | ICD-10-CM | POA: Diagnosis present

## 2022-03-10 DIAGNOSIS — N1831 Chronic kidney disease, stage 3a: Secondary | ICD-10-CM | POA: Diagnosis present

## 2022-03-10 DIAGNOSIS — Y902 Blood alcohol level of 40-59 mg/100 ml: Secondary | ICD-10-CM | POA: Diagnosis present

## 2022-03-10 DIAGNOSIS — E876 Hypokalemia: Secondary | ICD-10-CM | POA: Diagnosis present

## 2022-03-10 DIAGNOSIS — Z8674 Personal history of sudden cardiac arrest: Secondary | ICD-10-CM | POA: Diagnosis not present

## 2022-03-10 DIAGNOSIS — Z7984 Long term (current) use of oral hypoglycemic drugs: Secondary | ICD-10-CM | POA: Diagnosis not present

## 2022-03-10 DIAGNOSIS — Z79899 Other long term (current) drug therapy: Secondary | ICD-10-CM | POA: Diagnosis not present

## 2022-03-10 DIAGNOSIS — F10229 Alcohol dependence with intoxication, unspecified: Secondary | ICD-10-CM | POA: Diagnosis present

## 2022-03-10 DIAGNOSIS — I480 Paroxysmal atrial fibrillation: Secondary | ICD-10-CM | POA: Diagnosis present

## 2022-03-10 DIAGNOSIS — E7800 Pure hypercholesterolemia, unspecified: Secondary | ICD-10-CM | POA: Diagnosis present

## 2022-03-10 DIAGNOSIS — Z789 Other specified health status: Secondary | ICD-10-CM | POA: Diagnosis not present

## 2022-03-10 DIAGNOSIS — Z96652 Presence of left artificial knee joint: Secondary | ICD-10-CM | POA: Diagnosis present

## 2022-03-10 DIAGNOSIS — Z7901 Long term (current) use of anticoagulants: Secondary | ICD-10-CM | POA: Diagnosis not present

## 2022-03-10 DIAGNOSIS — G9341 Metabolic encephalopathy: Secondary | ICD-10-CM | POA: Diagnosis not present

## 2022-03-10 DIAGNOSIS — I5042 Chronic combined systolic (congestive) and diastolic (congestive) heart failure: Secondary | ICD-10-CM | POA: Diagnosis present

## 2022-03-10 DIAGNOSIS — N390 Urinary tract infection, site not specified: Secondary | ICD-10-CM | POA: Diagnosis present

## 2022-03-10 DIAGNOSIS — E162 Hypoglycemia, unspecified: Secondary | ICD-10-CM | POA: Diagnosis present

## 2022-03-10 DIAGNOSIS — F17210 Nicotine dependence, cigarettes, uncomplicated: Secondary | ICD-10-CM | POA: Diagnosis present

## 2022-03-10 DIAGNOSIS — Z888 Allergy status to other drugs, medicaments and biological substances status: Secondary | ICD-10-CM | POA: Diagnosis not present

## 2022-03-10 LAB — BASIC METABOLIC PANEL
Anion gap: 8 (ref 5–15)
BUN: 14 mg/dL (ref 8–23)
CO2: 25 mmol/L (ref 22–32)
Calcium: 9.6 mg/dL (ref 8.9–10.3)
Chloride: 105 mmol/L (ref 98–111)
Creatinine, Ser: 1.23 mg/dL (ref 0.61–1.24)
GFR, Estimated: >60 mL/min (ref >60)
Glucose, Bld: 155 mg/dL — ABNORMAL HIGH (ref 70–99)
Potassium: 3.9 mmol/L (ref 3.5–5.1)
Sodium: 138 mmol/L (ref 135–145)

## 2022-03-10 LAB — GLUCOSE, CAPILLARY
Glucose-Capillary: 106 mg/dL — ABNORMAL HIGH (ref 70–99)
Glucose-Capillary: 30 mg/dL — CL (ref 70–99)
Glucose-Capillary: 140 mg/dL — ABNORMAL HIGH (ref 70–99)
Glucose-Capillary: 154 mg/dL — ABNORMAL HIGH (ref 70–99)
Glucose-Capillary: 289 mg/dL — ABNORMAL HIGH (ref 70–99)
Glucose-Capillary: 302 mg/dL — ABNORMAL HIGH (ref 70–99)
Glucose-Capillary: 128 mg/dL — ABNORMAL HIGH (ref 70–99)

## 2022-03-10 MED ORDER — DEXTROSE 10 % IV SOLN: INTRAVENOUS | Status: DC

## 2022-03-10 MED ORDER — INSULIN ASPART 100 UNIT/ML IJ SOLN: 0.0000 [IU] | Freq: Every day | INTRAMUSCULAR | Status: DC

## 2022-03-10 MED ORDER — POTASSIUM CHLORIDE CRYS ER 20 MEQ PO TBCR
20.0000 meq | EXTENDED_RELEASE_TABLET | Freq: Every day | ORAL | Status: DC
  Administered 2022-03-10 – 2022-03-11 (×2): 20 meq via ORAL
  Filled 2022-03-10 (×2): qty 1

## 2022-03-10 MED ORDER — TAMSULOSIN HCL 0.4 MG PO CAPS
0.4000 mg | ORAL_CAPSULE | Freq: Every day | ORAL | Status: DC
  Administered 2022-03-10 (×2): 0.4 mg via ORAL
  Filled 2022-03-10 (×2): qty 1

## 2022-03-10 MED ORDER — APIXABAN 5 MG PO TABS
5.0000 mg | ORAL_TABLET | Freq: Two times a day (BID) | ORAL | Status: DC
  Administered 2022-03-10 (×3): 5 mg via ORAL
  Filled 2022-03-10 (×3): qty 1

## 2022-03-10 MED ORDER — CARVEDILOL 6.25 MG PO TABS
6.2500 mg | ORAL_TABLET | Freq: Two times a day (BID) | ORAL | Status: DC
  Administered 2022-03-10 – 2022-03-11 (×4): 6.25 mg via ORAL
  Filled 2022-03-10 (×4): qty 1

## 2022-03-10 MED ORDER — MONTELUKAST SODIUM 10 MG PO TABS
10.0000 mg | ORAL_TABLET | Freq: Every day | ORAL | Status: DC
  Administered 2022-03-10 (×2): 10 mg via ORAL
  Filled 2022-03-10 (×2): qty 1

## 2022-03-10 MED ORDER — DEXTROSE 5 % IV SOLN: INTRAVENOUS | Status: DC

## 2022-03-10 MED ORDER — SODIUM CHLORIDE 0.9 % IV SOLN
1.0000 g | INTRAVENOUS | Status: DC
  Administered 2022-03-10: 1 g via INTRAVENOUS
  Filled 2022-03-10: qty 10

## 2022-03-10 MED ORDER — DEXTROSE 50 % IV SOLN
INTRAVENOUS | Status: AC
  Administered 2022-03-10: 25 g
  Filled 2022-03-10: qty 50

## 2022-03-10 MED ORDER — INSULIN ASPART 100 UNIT/ML IJ SOLN
0.0000 [IU] | Freq: Three times a day (TID) | INTRAMUSCULAR | Status: DC
  Administered 2022-03-10: 11 [IU] via SUBCUTANEOUS

## 2022-03-10 MED ORDER — DEXTROSE 50 % IV SOLN: 25.0000 g | INTRAVENOUS | Status: AC

## 2022-03-10 MED ORDER — MAGNESIUM SULFATE 2 GM/50ML IV SOLN
2.0000 g | Freq: Once | INTRAVENOUS | Status: AC
  Administered 2022-03-10: 2 g via INTRAVENOUS
  Filled 2022-03-10: qty 50

## 2022-03-10 NOTE — Hospital Course (Signed)
Corey Meyer is a 71 y.o. M with HTN, DM, smoking, alcohol dependence, sCHF EF 40-45%, CKD IIIa baseline 1.2-1.7, Afib on Eliquis, hx GI bleed, polysubstance abuse (alcohol, cocaine, methamphetamine) and cardiac arrest May 2023 in [place] due to meth/cocaine use, chronic indwelling Foley catheter and hx CAUTI who presented with being unresponsive.  On the morning of admission, patient was industrious in the consumption of bourbon, then lay down for a well-deserved rest, which is the last thing he remembers.  An acquaintance subsequently found him unresponsive and "foaming at the mouth" and EMS was activated who found him with blood glucose 20 mg/dL and administered dextrose and glucagon and transported him to the hospital.  In the ER, urinalysis was contaminated (it was drawn from a foley), CTH normal, CXR clear.  He had further hypoglycemia and so was started on dextrose infusion and antibiotics and amditted.

## 2022-03-10 NOTE — Assessment & Plan Note (Signed)
-   Continue Flomax - Continue folate - Spoke with urology, they recommended discussing with patient's primary urologist tomorrow about possibility of proceeding with TURBT

## 2022-03-10 NOTE — Progress Notes (Signed)
Pt arrived onto unit from ED with NT at 0055. This nurse obtained midnight blood glucose as ordered and pt capillary blood glucose was 30. Pt was awake, alert, and oriented x4 at this time.This nurse implemented standing hypoglycemia standing orders and administered 1 amp of IV dextrose. The on-call physician was notified and orders obtained for continuous fluids. The patient's blood sugar upon rechecking was 106. Pt ate a [place] sandwich and 1 cup of orange juice. Will continue to monitor patient's blood sugar every 4 hours as ordered.

## 2022-03-10 NOTE — Assessment & Plan Note (Addendum)
Not on diuretic.  Appears euvolemic.  Last echo shows EF 40 to 45% - Continue Coreg

## 2022-03-10 NOTE — Assessment & Plan Note (Addendum)
Continue on beta-blocker and Eliquis. Reports cessation of cocaine and UDS is negative today.

## 2022-03-10 NOTE — Evaluation (Signed)
Physical Therapy Evaluation Patient Details Name: Corey Meyer MRN: [id number] DOB: 1951-02-16 Today's Date: 03/10/2022  History of Present Illness  Corey Meyer admitted from home with hypoglycemia and  acute metabolic encephalopathy.  Corey Meyer with hx of DM, hep C, poly substance abuse, L TKR, CHF, and chronic indwelling catheter.  Clinical Impression  Corey Meyer admitted as above but reports feeling much better and able to demonstrate IND in bed mobility and transfers and ambulated 350' unassisted with mildly antalgic gait but min instability and no LOB.  Corey Meyer states he is at or near baseline for mobility.  Corey Meyer with no current Corey Meyer needs and Corey Meyer service will sign off to mobility team.     Recommendations for follow up therapy are one component of a multi-disciplinary discharge planning process, led by the attending physician.  Recommendations may be updated based on patient status, additional functional criteria and insurance authorization.  Follow Up Recommendations No Corey Meyer follow up      Assistance Recommended at Discharge None  Patient can return home with the following       Equipment Recommendations None recommended by Corey Meyer  Recommendations for Other Services       Functional Status Assessment Patient has not had a recent decline in their functional status     Precautions / Restrictions Precautions Precautions: Fall Restrictions Weight Bearing Restrictions: No      Mobility  Bed Mobility Overal bed mobility: Modified Independent                  Transfers Overall transfer level: Modified independent                      Ambulation/Gait Ambulation/Gait assistance: Independent Gait Distance (Feet): 350 Feet Assistive device: None Gait Pattern/deviations: WFL(Within Functional Limits), Antalgic       General Gait Details: mildly antalgic gait with min instability and No LOB; Corey Meyer states at or near baseline  Stairs            Wheelchair Mobility    Modified Rankin  (Stroke Patients Only)       Balance Overall balance assessment: No apparent balance deficits (not formally assessed)                                           Pertinent Vitals/Pain Pain Assessment Pain Assessment: No/denies pain    Home Living Family/patient expects to be discharged to:: Private residence Living Arrangements: Non-relatives/Friends Available Help at Discharge: Friend(s);Available PRN/intermittently Type of Home: House Home Access: Stairs to enter   [employer] of Steps: 1   Home Layout: One level Home Equipment: None Additional Comments: lives with two roommates available to assist as needed    Prior Function Prior Level of Function : Independent/Modified Independent;Driving             Mobility Comments: reports indep without DME       Hand Dominance   Dominant Hand: Right    Extremity/Trunk Assessment   Upper Extremity Assessment Upper Extremity Assessment: Overall WFL for tasks assessed    Lower Extremity Assessment Lower Extremity Assessment: Overall WFL for tasks assessed       Communication   Communication: No difficulties  Cognition Arousal/Alertness: Awake/alert Behavior During Therapy: WFL for tasks assessed/performed Overall Cognitive Status: Within Functional Limits for tasks assessed  General Comments      Exercises     Assessment/Plan    Corey Meyer Assessment Patient needs continued Corey Meyer services  Corey Meyer Problem List         Corey Meyer Treatment Interventions Gait training    Corey Meyer Goals (Current goals can be found in the Care Plan section)  Acute Rehab Corey Meyer Goals Patient Stated Goal: HOME tomorrow Corey Meyer Goal Formulation: All assessment and education complete, DC therapy    Frequency Min 1X/week     Co-evaluation               AM-PAC Corey Meyer "6 Clicks" Mobility  Outcome Measure Help needed turning from your back to your side while in a flat bed  without using bedrails?: None Help needed moving from lying on your back to sitting on the side of a flat bed without using bedrails?: None Help needed moving to and from a bed to a chair (including a wheelchair)?: None Help needed standing up from a chair using your arms (e.g., wheelchair or bedside chair)?: None Help needed to walk in hospital room?: None Help needed climbing 3-5 steps with a railing? : None 6 Click Score: 24    End of Session Equipment Utilized During Treatment: Gait belt Activity Tolerance: Patient tolerated treatment well Patient left: in bed;with call bell/phone within reach;with bed alarm set Nurse Communication: Mobility status;Other (comment) (leaking catheter) Corey Meyer Visit Diagnosis: Difficulty in walking, not elsewhere classified (R26.2)    Time: 5093-2671 Corey Meyer Time Calculation (min) (ACUTE ONLY): 14 min   Charges:   Corey Meyer Evaluation $Corey Meyer Eval Low Complexity: 1 Low          Mauro Kaufmann Corey Meyer Acute Rehabilitation Services Pager [phone] Office [phone]   Aseel Truxillo 03/10/2022, 4:05 PM

## 2022-03-10 NOTE — Progress Notes (Signed)
24 hour chart audit completed 

## 2022-03-10 NOTE — Assessment & Plan Note (Signed)
-   Supplement Mag 

## 2022-03-10 NOTE — Progress Notes (Signed)
Progress Note   Patient: Corey Meyer NLG:[id number] DOB: [birth year] DOA: 03/09/2022     0 DOS: the patient was seen and examined on 03/10/2022        Brief hospital course: Corey Meyer is a 71 y.o. M with HTN, DM, smoking, alcohol dependence, sCHF EF 40-45%, CKD IIIa baseline 1.2-1.7, Afib on Eliquis, hx GI bleed, polysubstance abuse (alcohol, cocaine, methamphetamine) and cardiac arrest May 2023 in [place] due to meth/cocaine use, chronic indwelling Foley catheter and hx CAUTI who presented with being unresponsive.  On the morning of admission, patient was industrious in the consumption of bourbon, then lay down for a well-deserved rest, which is the last thing he remembers.  An acquaintance subsequently found him unresponsive and "foaming at the mouth" and EMS was activated who found him with blood glucose 20 mg/dL and administered dextrose and glucagon and transported him to the hospital.  In the ER, urinalysis was contaminated (it was drawn from a foley), CTH normal, CXR clear.  He had further hypoglycemia and so was started on dextrose infusion and antibiotics and amditted.     Assessment and Plan: * Acute metabolic encephalopathy Patient was unresponsive on activation of EMS.  This was due to hypoglycemia.  Likely also intoxication.     Hypoglycemia associated with diabetes (HCC) CBG 21 when found by EMS improved with D10 and glucagon.  Patient reported to nursing his glucose was down because he'd been "drinking for several days, not eating like he should". Glucose today up to 200.  A1c 7% - Stop dextrose infusion - PO diet - Hold Jardiance, glipizide, and metformin - Start sliding scale corrections   Hypomagnesemia - Supplement Mag  Chronic systolic CHF (congestive heart failure) (HCC) Not on diuretic.  Appears euvolemic.  Last echo shows EF 40 to 45% - Continue Coreg  History of cardiac arrest In the setting of drug use-this occurred in May 2023-was hospitalized at  [facility].  Paroxysmal atrial fibrillation (HCC) - Continue carvedilol, Eliquis  HTN (hypertension) Blood pressure controlled - Continue carvedilol  Stage 3a chronic kidney disease (CKD) (HCC) Creatinine is stable relative to baseline  Alcohol use Intoxicated on arrival.   PAWSS 7, high, history seizure and encephalopathy in setting of hypoglycemia and withdrawal 2 mon ths ago July 2023 admission under similar circumstances.  At present without any symptmos of withdrawal or malaise, feels well, good appetite, oriented.  - Continue CIWA with on demand lorazepam - Continue thiamine and folate  Hypokalemia - Supplement potassium  History of urinary retention - Continue Flomax - Continue folate - Spoke with urology, they recommended discussing with patient's primary urologist tomorrow about possibility of proceeding with TURBT  Possible UTI associated with chronic indwelling foley Present on admission.   Urinalysis taken from Foley with pyuria so hard to know what to make of that.  He does have some gross hematuria but no other localizing symptoms of UTI. - Continue Rocephin for now - Follow urine culture - Foley exchanged - Discussed with Dr. Alvester Morin tomorrow           Subjective: Patient feels well.  He has no pain from his Foley, he does have some gross hematuria.  No tremor, fever, sweats, shakes, confusion.     Physical Exam: BP 122/81 (BP Location: Right Arm)   Pulse 87   Temp 98.4 F (36.9 C) (Oral)   Resp 18   Ht 5\' 9"  (1.753 m)   Wt 90.7 kg   SpO2 98%   BMI 29.53  kg/m   Adult male, sitting up in recliner, eating lunch, interactive and appropriate RRR, no murmurs, no peripheral edema Respiratory rate normal, lungs clear without rales or wheezes Abdomen soft without tenderness palpation or guarding, no ascites or distention He has a Foley catheter bag at the bedside, this has some gross hematuria in the bag Attention normal, affect  appropriate, judgment insight appear normal, face symmetric, speech fluent    Data Reviewed: Discussed with urology fellow on-call Basic metabolic panel reviewed, shows potassium normalized, creatinine 1.2 and stable Hemogram shows hemoglobin 11.5 and stable Magnesium 1.6 Urinalysis dirty Urine drug screen negative CT head and chest x-ray reports reviewed, no acute disease Glucose hypoglycemic overnight        Disposition: Status is: Observation         Author: Alberteen Sam, MD 03/10/2022 4:27 PM  For on call review [email or web address].

## 2022-03-11 ENCOUNTER — Encounter (HOSPITAL_COMMUNITY): Payer: Self-pay | Admitting: Family Medicine

## 2022-03-11 ENCOUNTER — Ambulatory Visit (HOSPITAL_COMMUNITY)
Admission: RE | Admit: 2022-03-11 | Payer: No Typology Code available for payment source | Source: Home / Self Care | Admitting: Urology

## 2022-03-11 ENCOUNTER — Encounter (HOSPITAL_COMMUNITY)
Admission: EM | Disposition: A | Discharge status: Home / Self Care | Payer: Self-pay | Source: Home / Self Care | Attending: Family Medicine

## 2022-03-11 ENCOUNTER — Other Ambulatory Visit: Payer: Self-pay

## 2022-03-11 DIAGNOSIS — Z789 Other specified health status: Secondary | ICD-10-CM

## 2022-03-11 DIAGNOSIS — G9341 Metabolic encephalopathy: Secondary | ICD-10-CM | POA: Diagnosis not present

## 2022-03-11 DIAGNOSIS — E119 Type 2 diabetes mellitus without complications: Secondary | ICD-10-CM

## 2022-03-11 DIAGNOSIS — Z01818 Encounter for other preprocedural examination: Secondary | ICD-10-CM

## 2022-03-11 LAB — HEPATIC FUNCTION PANEL
ALT: 12 U/L (ref 0–44)
AST: 16 U/L (ref 15–41)
Albumin: 3 g/dL — ABNORMAL LOW (ref 3.5–5.0)
Alkaline Phosphatase: 59 U/L (ref 38–126)
Bilirubin, Direct: <0.1 mg/dL (ref 0.0–0.2)
Total Bilirubin: 0.5 mg/dL (ref 0.3–1.2)
Total Protein: 6.9 g/dL (ref 6.5–8.1)

## 2022-03-11 LAB — RENAL FUNCTION PANEL
Albumin: 3 g/dL — ABNORMAL LOW (ref 3.5–5.0)
Anion gap: 7 (ref 5–15)
BUN: 17 mg/dL (ref 8–23)
CO2: 25 mmol/L (ref 22–32)
Calcium: 9 mg/dL (ref 8.9–10.3)
Chloride: 103 mmol/L (ref 98–111)
Creatinine, Ser: 1.36 mg/dL — ABNORMAL HIGH (ref 0.61–1.24)
GFR, Estimated: 57 mL/min — ABNORMAL LOW (ref >60)
Glucose, Bld: 175 mg/dL — ABNORMAL HIGH (ref 70–99)
Phosphorus: 3.7 mg/dL (ref 2.5–4.6)
Potassium: 3.8 mmol/L (ref 3.5–5.1)
Sodium: 135 mmol/L (ref 135–145)

## 2022-03-11 LAB — CBC
HCT: 31 % — ABNORMAL LOW (ref 39.0–52.0)
Hemoglobin: 10 g/dL — ABNORMAL LOW (ref 13.0–17.0)
MCH: 29.2 pg (ref 26.0–34.0)
MCHC: 32.3 g/dL (ref 30.0–36.0)
MCV: 90.6 fL (ref 80.0–100.0)
Platelets: 264 10*3/uL (ref 150–400)
RBC: 3.42 MIL/uL — ABNORMAL LOW (ref 4.22–5.81)
RDW: 18.9 % — ABNORMAL HIGH (ref 11.5–15.5)
WBC: 7.8 10*3/uL (ref 4.0–10.5)
nRBC: 0 % (ref 0.0–0.2)

## 2022-03-11 LAB — GLUCOSE, CAPILLARY
Glucose-Capillary: 166 mg/dL — ABNORMAL HIGH (ref 70–99)
Glucose-Capillary: 169 mg/dL — ABNORMAL HIGH (ref 70–99)
Glucose-Capillary: 184 mg/dL — ABNORMAL HIGH (ref 70–99)
Glucose-Capillary: 210 mg/dL — ABNORMAL HIGH (ref 70–99)

## 2022-03-11 LAB — MAGNESIUM: Magnesium: 1.9 mg/dL (ref 1.7–2.4)

## 2022-03-11 SURGERY — TURP (TRANSURETHRAL RESECTION OF PROSTATE)
Anesthesia: General

## 2022-03-11 MED ORDER — LACTATED RINGERS IV SOLN: INTRAVENOUS | Status: DC

## 2022-03-11 MED ORDER — ACETAMINOPHEN 500 MG PO TABS: 1000.0000 mg | ORAL_TABLET | Freq: Once | ORAL | Status: DC

## 2022-03-11 MED ORDER — CEFAZOLIN SODIUM-DEXTROSE 2-4 GM/100ML-% IV SOLN
INTRAVENOUS | Status: AC
  Filled 2022-03-11: qty 100

## 2022-03-11 MED ORDER — CHLORHEXIDINE GLUCONATE 0.12 % MT SOLN: 15.0000 mL | Freq: Once | OROMUCOSAL | Status: DC

## 2022-03-11 MED ORDER — CEFAZOLIN SODIUM-DEXTROSE 2-4 GM/100ML-% IV SOLN: 2.0000 g | INTRAVENOUS | Status: DC

## 2022-03-11 MED ORDER — ORAL CARE MOUTH RINSE: 15.0000 mL | Freq: Once | OROMUCOSAL | Status: DC

## 2022-03-11 MED ORDER — GLIPIZIDE 5 MG PO TABS: 5.0000 mg | ORAL_TABLET | Freq: Every day | ORAL | 3 refills | Status: DC

## 2022-03-11 MED ORDER — ACETAMINOPHEN 500 MG PO TABS
ORAL_TABLET | ORAL | Status: AC
  Filled 2022-03-11: qty 2

## 2022-03-11 SURGICAL SUPPLY — 20 items
BAG URINE DRAIN 2000ML AR STRL (UROLOGICAL SUPPLIES) ×1 IMPLANT
BAG URO CATCHER STRL LF (MISCELLANEOUS) ×1 IMPLANT
CATH FOLEY 3WAY 30CC 24FR (CATHETERS) ×1
CATH URTH STD 24FR FL 3W 2 (CATHETERS) ×1 IMPLANT
CLOTH BEACON ORANGE TIMEOUT ST (SAFETY) ×1 IMPLANT
DRAPE FOOT SWITCH (DRAPES) ×1 IMPLANT
GLOVE BIO SURGEON STRL SZ7.5 (GLOVE) ×1 IMPLANT
GOWN STRL REUS W/ TWL XL LVL3 (GOWN DISPOSABLE) ×1 IMPLANT
GOWN STRL REUS W/TWL XL LVL3 (GOWN DISPOSABLE) ×1
HOLDER FOLEY CATH W/STRAP (MISCELLANEOUS) ×1 IMPLANT
KIT TURNOVER KIT A (KITS) IMPLANT
LOOP CUT BIPOLAR 24F LRG (ELECTROSURGICAL) ×1 IMPLANT
MANIFOLD NEPTUNE II (INSTRUMENTS) ×1 IMPLANT
PACK CYSTO (CUSTOM PROCEDURE TRAY) ×1 IMPLANT
PENCIL SMOKE EVACUATOR (MISCELLANEOUS) IMPLANT
SYR 30ML LL (SYRINGE) ×1 IMPLANT
SYR TOOMEY IRRIG 70ML (MISCELLANEOUS) ×1
SYRINGE TOOMEY IRRIG 70ML (MISCELLANEOUS) ×1 IMPLANT
TUBING CONNECTING 10 (TUBING) ×1 IMPLANT
TUBING UROLOGY SET (TUBING) ×1 IMPLANT

## 2022-03-11 NOTE — Discharge Summary (Signed)
Physician Discharge Summary   Patient: Corey Meyer MRN: [id number] DOB: Jul 06, 1950  Admit date:     03/09/2022  Discharge date: 03/11/22  Discharge Physician: Alberteen Sam   PCP: [facility]     Recommendations at discharge:  Follow up with PCP at the [facility] in 1 week Follow up with Urology Dr. Alvester Morin for rescheduled TURBT as soon as possible     Discharge Diagnoses: Principal Problem:   Acute metabolic encephalopathy due to hypoglycemia due to alcohol intoxication Active Problems:   Hypoglycemia associated with diabetes   Alcohol intoxication   UTI ruled out   History of urinary retention   Hypokalemia   Alcohol use disorder, severe   Stage 3a chronic kidney disease (CKD) (HCC)   HTN (hypertension)   Paroxysmal atrial fibrillation (HCC)   History of cardiac arrest   Chronic systolic CHF (congestive heart failure) (HCC)   Hypomagnesemia     [facility] Course: Corey Meyer is a 71 y.o. M with HTN, DM, smoking, alcohol dependence, sCHF EF 40-45%, CKD IIIa baseline 1.2-1.7, Afib on Eliquis, hx GI bleed, polysubstance abuse (alcohol, cocaine, methamphetamine) and cardiac arrest May 2023 in [place] due to meth/cocaine use, chronic indwelling Foley catheter and hx CAUTI who presented with being unresponsive.  Patient reported several days heavy bourbon drinking,, including the morning of admission.  The last thing he remembers is lying down, then roommate found him "foaming at the mouth", and EMS was activated.  EMS found with glucose 20 mg/dL administrated dextrose and glucagon and transported him to the [facility].     In the ER, urinalysis was contaminated (it was drawn from a foley), CTH normal, CXR clear.  He had further hypoglycemia and so was started on dextrose infusion and antibiotics and admitted.       * Acute metabolic encephalopathy due to hypoglycemia and alcohol intoxication Hypoglycemia associated with diabetes  CBG 21 when found by EMS improved  with D10 and glucagon.  Patient reported to nursing his glucose was down because he'd been "drinking for several days, not eating like he should".  Patient was treated with dextrose infusions overnight, glipizide was held, glucose 1 up to 200, dextrose was stopped and his blood sugars maintained normally.  Jardiance stopped at discharge.  Glipizide reduced at discharge.  Metformin continued.  Chronic systolic CHF (congestive heart failure) (HCC) Appears euvolemic  History of cardiac arrest In the setting of drug use-this occurred in May 2023-was hospitalized at [facility].  Paroxysmal atrial fibrillation Corey Meyer) Patient was unaware he had to hold his Eliquis for his TURP.  Eliquis was held at discharge for his upcoming TURP.    Alcohol use Intoxicated on arrival.   PAWSS 7, high, history seizure and encephalopathy in setting of hypoglycemia and withdrawal 2 months ago July 2023 admission under similar circumstances.    While in the [facility] 48 hours, the patient had no symptoms of withdrawal.  History of urinary retention Continue Flomax.  Patient will hold his Eliquis until his TURP hopefully on Friday.               The [facility] Controlled Substances Registry was reviewed for this patient prior to discharge.  Consultants: Urology   Disposition: Home Diet recommendation:  Discharge Diet Orders (From admission, onward)     Start     Ordered   03/11/22 0000  Diet - low sodium heart healthy        03/11/22 1117  DISCHARGE MEDICATION: Allergies as of 03/11/2022       Reactions   Haldol [haloperidol Lactate] Swelling, Other (See Comments)   Xerostomia        Medication List     STOP taking these medications    Eliquis 5 MG Tabs tablet Generic drug: apixaban   Jardiance 10 MG Tabs tablet Generic drug: empagliflozin       TAKE these medications    carvedilol 6.25 MG tablet Commonly known as: Coreg Take 1 tablet  (6.25 mg total) by mouth 2 (two) times daily.   folic acid 1 MG tablet Commonly known as: FOLVITE Take 1 mg by mouth daily.   glipiZIDE 5 MG tablet Commonly known as: GLUCOTROL Take 1 tablet (5 mg total) by mouth daily before breakfast. What changed:  medication strength how much to take   metFORMIN 1000 MG tablet Commonly known as: GLUCOPHAGE Take 1,000 mg by mouth 2 (two) times daily with a meal.   montelukast 10 MG tablet Commonly known as: SINGULAIR Take 10 mg by mouth at bedtime.   omeprazole 20 MG capsule Commonly known as: PRILOSEC Take 20 mg by mouth daily as needed (indigestion).   potassium chloride SA 20 MEQ tablet Commonly known as: KLOR-CON M Take 1 tablet (20 mEq total) by mouth daily.   sildenafil 50 MG tablet Commonly known as: VIAGRA Take 50 mg by mouth daily as needed for erectile dysfunction.   tamsulosin 0.4 MG Caps capsule Commonly known as: FLOMAX Take 0.4 mg by mouth at bedtime.        Follow-up Information     Lucas Mallow, MD Follow up.   Specialty: Urology Contact information: [place] [id number] [facility] Follow up.   Contact information: [place] [address] [phone]                 Discharge Instructions     Diet - low sodium heart healthy   Complete by: As directed    Discharge instructions   Complete by: As directed    From Dr. Loleta Books: You were admitted for a coma because of low blood sugar. This probably was the result of too much alcohol and not eating enough. You should completely abstain from alcohol. Also, STOP Jardiance for now And REDUCE your glipizide to 5 mg daily (from 10 mg)  I have sent a new prescription for glipizide to your pharmacy, in the meantime you can take a 1/2 tab of your old prescription until you pick the new prescrption up  Continue metformin  Continue your blood pressure  medicine carvedilol and Flomax  HOLD your apixaban for now. Wait until after your TURB bladder procedure before you restart it  Call your primary care doctor at the [facility] for an appointment Call Dr. Purvis Sheffield office to arrange rescheduling of the prostate procedure   Increase activity slowly   Complete by: As directed        Discharge Exam: Filed Weights   03/09/22 2037  Weight: 90.7 kg    General: Pt is alert, awake, not in acute distress Cardiovascular: RRR, nl S1-S2, no murmurs appreciated.   No LE edema.   Respiratory: Normal respiratory rate and rhythm.  CTAB without rales or wheezes. Abdominal: Abdomen soft and non-tender.  No distension or HSM.   Neuro/Psych: Strength symmetric in upper and lower extremities.  Judgment and insight appear normal.   Condition  at discharge: good  The results of significant diagnostics from this hospitalization (including imaging, microbiology, ancillary and laboratory) are listed below for reference.   Imaging Studies: CT Head Wo Contrast  Result Date: 03/09/2022 CLINICAL DATA:  Seizure-like activity EXAM: CT HEAD WITHOUT CONTRAST TECHNIQUE: Contiguous axial images were obtained from the base of the skull through the vertex without intravenous contrast. RADIATION DOSE REDUCTION: This exam was performed according to the departmental dose-optimization program which includes automated exposure control, adjustment of the mA and/or kV according to patient size and/or use of iterative reconstruction technique. COMPARISON:  12/30/2021 FINDINGS: Brain: No evidence of acute infarction, hemorrhage, hydrocephalus, extra-axial collection or mass lesion/mass effect. Mild subcortical white matter and periventricular small vessel ischemic changes. Vascular: No hyperdense vessel or unexpected calcification. Skull: Normal. Negative for fracture or focal lesion. Sinuses/Orbits: Mild mucosal thickening of the left maxillary sinus. Visualized paranasal sinuses and mastoid  air cells are otherwise clear. Other: None. IMPRESSION: No acute intracranial abnormality. Mild small vessel ischemic changes. Electronically Signed   By: Charline Bills M.D.   On: 03/09/2022 21:20   DG Chest 2 View  Result Date: 03/09/2022 CLINICAL DATA:  Possible aspiration EXAM: CHEST - 2 VIEW COMPARISON:  12/30/2021 FINDINGS: Lungs are clear.  No pleural effusion or pneumothorax. The heart is normal in size. Visualized osseous structures are within normal limits. Shrapnel overlying the upper chest. IMPRESSION: Normal chest radiographs. Electronically Signed   By: Charline Bills M.D.   On: 03/09/2022 21:19    Microbiology: Results for orders placed or performed during the [facility] encounter of 03/09/22  Urine Culture     Status: Abnormal (Preliminary result)   Collection Time: 03/09/22 10:52 PM   Specimen: Urine, Catheterized  Result Value Ref Range Status   Specimen Description   Final    URINE, CATHETERIZED Performed at [facility], [address], [place], [place] [address]    Special Requests   Final    NONE Performed at [facility], [address], [place], [place] [address]    Culture >=100,000 COLONIES/mL STAPHYLOCOCCUS AUREUS (A)  Final   Report Status PENDING  Incomplete    Labs: CBC: Recent Labs  Lab 03/09/22 2040 03/11/22 0337  WBC 8.1 7.8  NEUTROABS 6.0  --   HGB 11.5* 10.0*  HCT 36.0* 31.0*  MCV 90.5 90.6  PLT 282 264   Basic Metabolic Panel: Recent Labs  Lab 03/09/22 2040 03/10/22 0359 03/11/22 0337  NA 139 138 135  K 3.1* 3.9 3.8  CL 105 105 103  CO2 24 25 25   GLUCOSE 149* 155* 175*  BUN 12 14 17   CREATININE 1.24 1.23 1.36*  CALCIUM 9.8 9.6 9.0  MG 1.6*  --  1.9  PHOS  --   --  3.7   Liver Function Tests: Recent Labs  Lab 03/09/22 2040 03/11/22 0337  AST 26 16  ALT 13 12  ALKPHOS 77 59  BILITOT 0.4 0.5  PROT 8.0 6.9  ALBUMIN 3.7 3.0*  3.0*   CBG: Recent Labs  Lab 03/10/22 2006  03/11/22 0009 03/11/22 0409 03/11/22 0638 03/11/22 0748  GLUCAP 128* 166* 210* 184* 169*    Discharge time spent: approximately 35 minutes spent on discharge counseling, evaluation of patient on day of discharge, and coordination of discharge planning with nursing, social work, pharmacy and case management  Signed: 05/11/22, MD [facility] 03/11/2022

## 2022-03-11 NOTE — TOC Transition Note (Signed)
Transition of Care [facility]) - CM/SW Discharge Note   Patient Details  Name: Corey Meyer MRN: [id number] Date of Birth: 1951-02-07  [facility] CM/SW Contact:  Lavenia Atlas, RN Phone Number: 03/11/2022, 9:32 AM   Clinical Narrative:   No current TOC needs. Spoke with patient who reports his best friend will come to pick patient at discharge.  [facility] information: Kathryne Sharper, PCP: Dr. Chilton Si, [place] [phone] ext [phone]  [facility] will follow if any needs arise prior to discharge.    Final next level of care: Home/Self Care Barriers to Discharge: No Barriers Identified   Patient Goals and CMS Choice Patient states their goals for this hospitalization and ongoing recovery are:: return home   Choice offered to / list presented to : NA  Discharge Placement  Home/self care                      Discharge Plan and Services In-house Referral: NA Discharge Planning Services: CM Consult Post Acute Care Choice: NA          DME Arranged: N/A DME Agency: NA       HH Arranged: NA HH Agency: NA        Social Determinants of Health (SDOH) Interventions     Readmission Risk Interventions     No data to display

## 2022-03-13 ENCOUNTER — Other Ambulatory Visit: Payer: Self-pay | Admitting: Urology

## 2022-03-13 LAB — URINE CULTURE: Culture: >=100000 — AB

## 2022-03-14 ENCOUNTER — Encounter (HOSPITAL_COMMUNITY): Payer: Self-pay | Admitting: Urology

## 2022-03-14 ENCOUNTER — Other Ambulatory Visit: Payer: Self-pay

## 2022-03-14 NOTE — Progress Notes (Signed)
Updated date of surgery:  03/18/22  Updated time of arrival:0830 AM  Patient denies any changes in allergies, medications, medical history since pre op appointment on:  Pre op instructions reviewed, follow up questions addressed and patient verbalized understanding at this time.

## 2022-03-18 ENCOUNTER — Other Ambulatory Visit: Payer: Self-pay

## 2022-03-18 ENCOUNTER — Encounter (HOSPITAL_COMMUNITY)
Admission: RE | Disposition: A | Discharge status: Home / Self Care | Payer: Self-pay | Source: Home / Self Care | Attending: Urology

## 2022-03-18 ENCOUNTER — Ambulatory Visit (HOSPITAL_COMMUNITY): Payer: No Typology Code available for payment source | Admitting: Certified Registered"

## 2022-03-18 ENCOUNTER — Encounter (HOSPITAL_COMMUNITY): Payer: Self-pay | Admitting: Urology

## 2022-03-18 ENCOUNTER — Ambulatory Visit (HOSPITAL_BASED_OUTPATIENT_CLINIC_OR_DEPARTMENT_OTHER): Payer: No Typology Code available for payment source | Admitting: Certified Registered"

## 2022-03-18 ENCOUNTER — Observation Stay (HOSPITAL_COMMUNITY)
Admission: RE | Admit: 2022-03-18 | Discharge: 2022-03-19 | Disposition: A | Discharge status: Home / Self Care | Payer: No Typology Code available for payment source | Attending: Urology | Admitting: Urology

## 2022-03-18 DIAGNOSIS — F17200 Nicotine dependence, unspecified, uncomplicated: Secondary | ICD-10-CM | POA: Insufficient documentation

## 2022-03-18 DIAGNOSIS — Z7901 Long term (current) use of anticoagulants: Secondary | ICD-10-CM | POA: Insufficient documentation

## 2022-03-18 DIAGNOSIS — N401 Enlarged prostate with lower urinary tract symptoms: Secondary | ICD-10-CM

## 2022-03-18 DIAGNOSIS — Z7984 Long term (current) use of oral hypoglycemic drugs: Secondary | ICD-10-CM | POA: Diagnosis not present

## 2022-03-18 DIAGNOSIS — I252 Old myocardial infarction: Secondary | ICD-10-CM | POA: Diagnosis not present

## 2022-03-18 DIAGNOSIS — F17210 Nicotine dependence, cigarettes, uncomplicated: Secondary | ICD-10-CM

## 2022-03-18 DIAGNOSIS — E119 Type 2 diabetes mellitus without complications: Secondary | ICD-10-CM | POA: Insufficient documentation

## 2022-03-18 DIAGNOSIS — I119 Hypertensive heart disease without heart failure: Secondary | ICD-10-CM | POA: Diagnosis not present

## 2022-03-18 DIAGNOSIS — I110 Hypertensive heart disease with heart failure: Secondary | ICD-10-CM

## 2022-03-18 DIAGNOSIS — Z79899 Other long term (current) drug therapy: Secondary | ICD-10-CM | POA: Diagnosis not present

## 2022-03-18 DIAGNOSIS — R338 Other retention of urine: Secondary | ICD-10-CM | POA: Diagnosis not present

## 2022-03-18 DIAGNOSIS — N400 Enlarged prostate without lower urinary tract symptoms: Secondary | ICD-10-CM | POA: Diagnosis present

## 2022-03-18 DIAGNOSIS — I509 Heart failure, unspecified: Secondary | ICD-10-CM

## 2022-03-18 HISTORY — DX: Benign prostatic hyperplasia without lower urinary tract symptoms: N40.0

## 2022-03-18 HISTORY — PX: TRANSURETHRAL RESECTION OF PROSTATE: SHX73

## 2022-03-18 LAB — GLUCOSE, CAPILLARY
Glucose-Capillary: 141 mg/dL — ABNORMAL HIGH (ref 70–99)
Glucose-Capillary: 172 mg/dL — ABNORMAL HIGH (ref 70–99)
Glucose-Capillary: 179 mg/dL — ABNORMAL HIGH (ref 70–99)
Glucose-Capillary: 260 mg/dL — ABNORMAL HIGH (ref 70–99)

## 2022-03-18 SURGERY — TURP (TRANSURETHRAL RESECTION OF PROSTATE)
Anesthesia: General

## 2022-03-18 MED ORDER — POTASSIUM CHLORIDE CRYS ER 20 MEQ PO TBCR
20.0000 meq | EXTENDED_RELEASE_TABLET | Freq: Every day | ORAL | Status: DC
  Administered 2022-03-18: 20 meq via ORAL
  Filled 2022-03-18: qty 1

## 2022-03-18 MED ORDER — DEXAMETHASONE SODIUM PHOSPHATE 10 MG/ML IJ SOLN
INTRAMUSCULAR | Status: AC
Start: 2022-03-18 — End: ?
  Filled 2022-03-18: qty 1

## 2022-03-18 MED ORDER — EPHEDRINE SULFATE-NACL 50-0.9 MG/10ML-% IV SOSY
PREFILLED_SYRINGE | INTRAVENOUS | Status: DC | PRN
  Administered 2022-03-18 (×2): 10 mg via INTRAVENOUS
  Administered 2022-03-18: 5 mg via INTRAVENOUS

## 2022-03-18 MED ORDER — FOLIC ACID 1 MG PO TABS
1.0000 mg | ORAL_TABLET | Freq: Every day | ORAL | Status: DC
  Administered 2022-03-18: 1 mg via ORAL
  Filled 2022-03-18: qty 1

## 2022-03-18 MED ORDER — LIDOCAINE 2% (20 MG/ML) 5 ML SYRINGE
INTRAMUSCULAR | Status: DC | PRN
  Administered 2022-03-18: 40 mg via INTRAVENOUS

## 2022-03-18 MED ORDER — HYDROMORPHONE HCL 1 MG/ML IJ SOLN
INTRAMUSCULAR | Status: AC
  Filled 2022-03-18: qty 2

## 2022-03-18 MED ORDER — METFORMIN HCL 500 MG PO TABS
1000.0000 mg | ORAL_TABLET | Freq: Two times a day (BID) | ORAL | Status: DC
  Administered 2022-03-18: 1000 mg via ORAL
  Filled 2022-03-18: qty 2

## 2022-03-18 MED ORDER — FENTANYL CITRATE (PF) 100 MCG/2ML IJ SOLN
INTRAMUSCULAR | Status: AC
  Filled 2022-03-18: qty 2

## 2022-03-18 MED ORDER — OXYBUTYNIN CHLORIDE 5 MG PO TABS: 5.0000 mg | ORAL_TABLET | Freq: Three times a day (TID) | ORAL | Status: DC | PRN

## 2022-03-18 MED ORDER — TRIPLE ANTIBIOTIC 3.5-400-5000 EX OINT: 1.0000 | TOPICAL_OINTMENT | Freq: Three times a day (TID) | CUTANEOUS | Status: DC | PRN

## 2022-03-18 MED ORDER — MORPHINE SULFATE (PF) 2 MG/ML IV SOLN: 2.0000 mg | INTRAVENOUS | Status: DC | PRN

## 2022-03-18 MED ORDER — CEFAZOLIN SODIUM-DEXTROSE 2-4 GM/100ML-% IV SOLN
2.0000 g | INTRAVENOUS | Status: AC
  Administered 2022-03-18: 2 g via INTRAVENOUS
  Filled 2022-03-18: qty 100

## 2022-03-18 MED ORDER — DIPHENHYDRAMINE HCL 50 MG/ML IJ SOLN: 12.5000 mg | Freq: Four times a day (QID) | INTRAMUSCULAR | Status: DC | PRN

## 2022-03-18 MED ORDER — GLIPIZIDE 5 MG PO TABS: 5.0000 mg | ORAL_TABLET | Freq: Every day | ORAL | Status: DC

## 2022-03-18 MED ORDER — SENNOSIDES-DOCUSATE SODIUM 8.6-50 MG PO TABS
2.0000 | ORAL_TABLET | Freq: Every day | ORAL | Status: DC
  Administered 2022-03-18: 2 via ORAL
  Filled 2022-03-18: qty 2

## 2022-03-18 MED ORDER — HYDROMORPHONE HCL 1 MG/ML IJ SOLN
0.2500 mg | INTRAMUSCULAR | Status: DC | PRN
  Administered 2022-03-18 (×2): 0.5 mg via INTRAVENOUS

## 2022-03-18 MED ORDER — LACTATED RINGERS IV SOLN: INTRAVENOUS | Status: DC

## 2022-03-18 MED ORDER — OXYCODONE HCL 5 MG PO TABS: 5.0000 mg | ORAL_TABLET | ORAL | Status: DC | PRN

## 2022-03-18 MED ORDER — PROPOFOL 10 MG/ML IV BOLUS
INTRAVENOUS | Status: DC | PRN
  Administered 2022-03-18: 120 mg via INTRAVENOUS

## 2022-03-18 MED ORDER — CARVEDILOL 6.25 MG PO TABS
6.2500 mg | ORAL_TABLET | Freq: Two times a day (BID) | ORAL | Status: DC
  Administered 2022-03-18: 6.25 mg via ORAL
  Filled 2022-03-18: qty 1

## 2022-03-18 MED ORDER — ZOLPIDEM TARTRATE 5 MG PO TABS: 5.0000 mg | ORAL_TABLET | Freq: Every evening | ORAL | Status: DC | PRN

## 2022-03-18 MED ORDER — MONTELUKAST SODIUM 10 MG PO TABS
10.0000 mg | ORAL_TABLET | Freq: Every day | ORAL | Status: DC
  Administered 2022-03-18: 10 mg via ORAL
  Filled 2022-03-18: qty 1

## 2022-03-18 MED ORDER — MIDAZOLAM HCL 2 MG/2ML IJ SOLN
INTRAMUSCULAR | Status: DC | PRN
  Administered 2022-03-18 (×2): 1 mg via INTRAVENOUS

## 2022-03-18 MED ORDER — PHENYLEPHRINE HCL (PRESSORS) 10 MG/ML IV SOLN
INTRAVENOUS | Status: AC
  Filled 2022-03-18: qty 1

## 2022-03-18 MED ORDER — MIDAZOLAM HCL 2 MG/2ML IJ SOLN
INTRAMUSCULAR | Status: AC
  Filled 2022-03-18: qty 2

## 2022-03-18 MED ORDER — ONDANSETRON HCL 4 MG/2ML IJ SOLN
INTRAMUSCULAR | Status: DC | PRN
  Administered 2022-03-18: 4 mg via INTRAVENOUS

## 2022-03-18 MED ORDER — ONDANSETRON HCL 4 MG/2ML IJ SOLN
INTRAMUSCULAR | Status: AC
  Filled 2022-03-18: qty 2

## 2022-03-18 MED ORDER — EPHEDRINE 5 MG/ML INJ
INTRAVENOUS | Status: AC
  Filled 2022-03-18: qty 5

## 2022-03-18 MED ORDER — PHENYLEPHRINE HCL-NACL 20-0.9 MG/250ML-% IV SOLN
INTRAVENOUS | Status: DC | PRN
  Administered 2022-03-18: 50 ug/min via INTRAVENOUS

## 2022-03-18 MED ORDER — INSULIN ASPART 100 UNIT/ML IJ SOLN
0.0000 [IU] | Freq: Three times a day (TID) | INTRAMUSCULAR | Status: DC
  Administered 2022-03-18: 8 [IU] via SUBCUTANEOUS

## 2022-03-18 MED ORDER — OXYCODONE HCL 5 MG PO TABS: 5.0000 mg | ORAL_TABLET | Freq: Once | ORAL | Status: DC | PRN

## 2022-03-18 MED ORDER — PHENYLEPHRINE 80 MCG/ML (10ML) SYRINGE FOR IV PUSH (FOR BLOOD PRESSURE SUPPORT)
PREFILLED_SYRINGE | INTRAVENOUS | Status: DC | PRN
  Administered 2022-03-18 (×9): 80 ug via INTRAVENOUS
  Administered 2022-03-18: 160 ug via INTRAVENOUS
  Administered 2022-03-18: 80 ug via INTRAVENOUS

## 2022-03-18 MED ORDER — METFORMIN HCL 500 MG PO TABS: 1000.0000 mg | ORAL_TABLET | Freq: Two times a day (BID) | ORAL | Status: DC

## 2022-03-18 MED ORDER — ONDANSETRON HCL 4 MG/2ML IJ SOLN: 4.0000 mg | Freq: Once | INTRAMUSCULAR | Status: DC | PRN

## 2022-03-18 MED ORDER — SODIUM CHLORIDE 0.9 % IR SOLN: 3000.0000 mL | Status: DC

## 2022-03-18 MED ORDER — SODIUM CHLORIDE 0.9 % IR SOLN
Status: DC | PRN
  Administered 2022-03-18: 33000 mL via INTRAVESICAL

## 2022-03-18 MED ORDER — ACETAMINOPHEN 325 MG PO TABS: 650.0000 mg | ORAL_TABLET | ORAL | Status: DC | PRN

## 2022-03-18 MED ORDER — ONDANSETRON HCL 4 MG/2ML IJ SOLN: 4.0000 mg | INTRAMUSCULAR | Status: DC | PRN

## 2022-03-18 MED ORDER — SODIUM CHLORIDE 0.9 % IV SOLN: INTRAVENOUS | Status: DC

## 2022-03-18 MED ORDER — AMISULPRIDE (ANTIEMETIC) 5 MG/2ML IV SOLN: 10.0000 mg | Freq: Once | INTRAVENOUS | Status: DC | PRN

## 2022-03-18 MED ORDER — OXYCODONE HCL 5 MG/5ML PO SOLN: 5.0000 mg | Freq: Once | ORAL | Status: DC | PRN

## 2022-03-18 MED ORDER — PANTOPRAZOLE SODIUM 40 MG PO TBEC
40.0000 mg | DELAYED_RELEASE_TABLET | Freq: Every day | ORAL | Status: DC
  Administered 2022-03-18: 40 mg via ORAL
  Filled 2022-03-18: qty 1

## 2022-03-18 MED ORDER — STERILE WATER FOR IRRIGATION IR SOLN
Status: DC | PRN
  Administered 2022-03-18: 500 mL

## 2022-03-18 MED ORDER — DEXAMETHASONE SODIUM PHOSPHATE 10 MG/ML IJ SOLN
INTRAMUSCULAR | Status: DC | PRN
  Administered 2022-03-18: 4 mg via INTRAVENOUS

## 2022-03-18 MED ORDER — PHENYLEPHRINE 80 MCG/ML (10ML) SYRINGE FOR IV PUSH (FOR BLOOD PRESSURE SUPPORT)
PREFILLED_SYRINGE | INTRAVENOUS | Status: AC
  Filled 2022-03-18: qty 20

## 2022-03-18 MED ORDER — PROPOFOL 10 MG/ML IV BOLUS
INTRAVENOUS | Status: AC
  Filled 2022-03-18: qty 20

## 2022-03-18 MED ORDER — FENTANYL CITRATE (PF) 100 MCG/2ML IJ SOLN
INTRAMUSCULAR | Status: DC | PRN
  Administered 2022-03-18: 25 ug via INTRAVENOUS
  Administered 2022-03-18 (×2): 50 ug via INTRAVENOUS
  Administered 2022-03-18: 25 ug via INTRAVENOUS

## 2022-03-18 MED ORDER — CHLORHEXIDINE GLUCONATE 0.12 % MT SOLN
15.0000 mL | Freq: Once | OROMUCOSAL | Status: AC
  Administered 2022-03-18: 15 mL via OROMUCOSAL

## 2022-03-18 MED ORDER — DIPHENHYDRAMINE HCL 12.5 MG/5ML PO ELIX: 12.5000 mg | ORAL_SOLUTION | Freq: Four times a day (QID) | ORAL | Status: DC | PRN

## 2022-03-18 SURGICAL SUPPLY — 23 items
BAG URINE DRAIN 2000ML AR STRL (UROLOGICAL SUPPLIES) ×1 IMPLANT
BAG URO CATCHER STRL LF (MISCELLANEOUS) ×1 IMPLANT
CATH FOLEY 3WAY 30CC 24FR (CATHETERS) ×1
CATH HEMA 3WAY 30CC 24FR COUDE (CATHETERS) IMPLANT
CATH URTH STD 24FR FL 3W 2 (CATHETERS) ×1 IMPLANT
CLOTH BEACON ORANGE TIMEOUT ST (SAFETY) ×1 IMPLANT
DRAPE FOOT SWITCH (DRAPES) ×1 IMPLANT
GLOVE BIO SURGEON STRL SZ7.5 (GLOVE) ×1 IMPLANT
GOWN STRL REUS W/ TWL XL LVL3 (GOWN DISPOSABLE) ×1 IMPLANT
GOWN STRL REUS W/TWL XL LVL3 (GOWN DISPOSABLE) ×1
GUIDEWIRE STR DUAL SENSOR (WIRE) IMPLANT
HOLDER FOLEY CATH W/STRAP (MISCELLANEOUS) ×1 IMPLANT
KIT TURNOVER KIT A (KITS) IMPLANT
LASER FIB FLEXIVA PULSE ID 550 (Laser) IMPLANT
LOOP CUT BIPOLAR 24F LRG (ELECTROSURGICAL) ×1 IMPLANT
MANIFOLD NEPTUNE II (INSTRUMENTS) ×1 IMPLANT
PACK CYSTO (CUSTOM PROCEDURE TRAY) ×1 IMPLANT
PENCIL SMOKE EVACUATOR (MISCELLANEOUS) IMPLANT
SYR 30ML LL (SYRINGE) ×1 IMPLANT
SYR TOOMEY IRRIG 70ML (MISCELLANEOUS) ×1
SYRINGE TOOMEY IRRIG 70ML (MISCELLANEOUS) ×1 IMPLANT
TUBING CONNECTING 10 (TUBING) ×1 IMPLANT
TUBING UROLOGY SET (TUBING) ×1 IMPLANT

## 2022-03-18 NOTE — Anesthesia Preprocedure Evaluation (Addendum)
Anesthesia Evaluation  Patient identified by MRN, date of birth, ID band Patient awake    Reviewed: Allergy & Precautions, NPO status , Patient's Chart, lab work & pertinent test results, reviewed documented beta blocker date and time   Airway Mallampati: II  TM Distance: >3 FB Neck ROM: Full    Dental  (+) Edentulous Upper, Edentulous Lower   Pulmonary Current Smoker,    Pulmonary exam normal breath sounds clear to auscultation       Cardiovascular hypertension, Pt. on medications and Pt. on home beta blockers + angina + Past MI and +CHF  Normal cardiovascular exam+ dysrhythmias Atrial Fibrillation  Rhythm:Regular Rate:Normal  Echo 12/26/18 1. The left ventricle has mild-moderately reduced systolic function, with an ejection fraction of 40-45%. The cavity size was mildly dilated. Left ventricular diastolic parameters were normal. Left ventrical global hypokinesis without regional wall motion abnormalities.  2. The right ventricle has normal systolic function. The cavity was normal. There is no increase in right ventricular wall thickness.   EKG 03/11/22 NSR, non specific IVCD, LAD, Prolonged QTc  Hx/o cardiac arrest in [place] 10/2021 in setting of cocaine and methamphetamine and alcohol use  Cardiac Cath 11/2018 Normal coronaries   Neuro/Psych Seizures -,  ETOH withdrawal seizures negative psych ROS   GI/Hepatic GERD  Medicated,(+)     substance abuse  alcohol use, cocaine use and methamphetamine use, Hepatitis -, CLast ETOH use yesterday, no drugs in last several months   Endo/Other  diabetes, Well Controlled, Type 2, Oral Hypoglycemic AgentsHyperlipidemia  Renal/GU Renal InsufficiencyRenal disease   BPH with urinary retention    Musculoskeletal negative musculoskeletal ROS (+)   Abdominal   Peds  Hematology  (+) Blood dyscrasia, anemia , Eliquis therapy- last dose    Anesthesia Other Findings    Reproductive/Obstetrics ED                            Anesthesia Physical Anesthesia Plan  ASA: 3  Anesthesia Plan: General   Post-op Pain Management: Dilaudid IV and Precedex   Induction: Intravenous  PONV Risk Score and Plan: 3 and Treatment may vary due to age or medical condition, Midazolam, Dexamethasone and Ondansetron  Airway Management Planned: LMA  Additional Equipment: None  Intra-op Plan:   Post-operative Plan: Extubation in OR  Informed Consent: I have reviewed the patients History and Physical, chart, labs and discussed the procedure including the risks, benefits and alternatives for the proposed anesthesia with the patient or authorized representative who has indicated his/her understanding and acceptance.     Dental advisory given  Plan Discussed with: CRNA and Anesthesiologist  Anesthesia Plan Comments:         Anesthesia Quick Evaluation

## 2022-03-18 NOTE — Anesthesia Procedure Notes (Signed)
Procedure Name: LMA Insertion Date/Time: 03/18/2022 10:56 AM  Performed by: Eben Burow, CRNAPre-anesthesia Checklist: Patient identified, Emergency Drugs available, Suction available, Patient being monitored and Timeout performed Patient Re-evaluated:Patient Re-evaluated prior to induction Oxygen Delivery Method: Circle system utilized Preoxygenation: Pre-oxygenation with 100% oxygen Induction Type: IV induction Ventilation: Mask ventilation without difficulty LMA: LMA inserted LMA Size: 4.0 Number of attempts: 1 Tube secured with: Tape Dental Injury: Teeth and Oropharynx as per pre-operative assessment

## 2022-03-18 NOTE — H&P (Signed)
/HPI: CC: Elevated PSA  HPI:  08/21/2021  71 year old male with an elevated PSA. PSA as follows: 09/28/2018--4.4, 11/26/2018--5.3, 04/06/2021--5.72. Patient has voiding complaints including difficulty postponing urination, weak stream, urgency, nocturia 4-5 times a night. He is on tamsulosin once a day. He denies any hematuria or dysuria. Denies family history of prostate cancer. He is on apixaban. He is not sure why.   11/30/2021  Patient never followed up for prostate biopsy. In the interval, he went into urinary retention. This was about 3 weeks ago according to him. He had been admitted to the hospital for GI bleeding. Looks like he had a Foley catheter placed at that time. This was at an outside hospital. Foley catheter was removed but he went into retention and went to the emergency department on 5/4. He was placed on tamsulosin at the last visit but apparently was not taking that. He started tamsulosin after the problems with urinary retention. Patient declines voiding trial today since he has failed a couple of them according to him. He does have some discharge around the Foley catheter. Request antibiotics for this. No suprapubic pain or discomfort, no fever. Urine clear in Foley bag. He does have a history of multiple medical comorbidities including chronic heart failure, hypertension, alcoholic cardiomyopathy, diabetes.   01/17/2022  Urodynamics was unable to be completed. Urodynamics line was unable to be placed. It met resistance. Prostate size 116 g by ultrasound. Patient remains in retention.   01/25/22: The patient presents today for the third time this week with catheter occlusion. He was seen yesterday and the catheter was exchanged. At that time his catheter was draining poorly and there was approximately 250 cc in his bladder. Today he states that the catheter bag has not been filling up and he feels pressure. His bladder scan was greater than 160. We bumped his catheter with 25 cc of saline  and approximately 600 cc of clear urine drained. Patient denies any hematuria or bladder pain. He is not having any spasms.   02/21/2022  We have not been able to contact the patient for scheduling of his TURP. He comes in today to discuss the procedure again. Has not had a catheter change.     ALLERGIES: None   MEDICATIONS: Metoprolol Succinate  Omeprazole  Simvastatin 20 mg tablet  Tamsulosin Hcl 0.4 mg capsule 2 capsule PO Daily  Eliquis  Ferrous Sulfate  Glipizide 10 mg tablet  Montelukast Sodium  Valsartan     GU PSH: Cystoscopy - 01/17/2022     NON-GU PSH: Hernia Repair Knee Arthroscopy/surgery, Left     GU PMH: BPH w/LUTS - 01/25/2022, - 01/24/2022, - 01/23/2022, - 01/17/2022, - 11/30/2021, - 08/21/2021 Urinary Retention - 01/25/2022, - 01/24/2022, - 01/17/2022, - 12/20/2021, - 11/30/2021 Incomplete bladder emptying - 01/23/2022 Elevated PSA - 11/30/2021, - 08/21/2021 Encounter for Prostate Cancer screening - 11/30/2021, - 08/21/2021 Nocturia - 08/21/2021 Urinary Frequency - 08/21/2021 Urinary Urgency - 08/21/2021 Weak Urinary Stream - 08/21/2021    NON-GU PMH: Anxiety Diabetes Type 2 GERD Heart disease, unspecified Hypercholesterolemia Hypertension Inflammatory liver disease, unspecified    FAMILY HISTORY: Diabetes - Father   SOCIAL HISTORY: Marital Status: Single Preferred Language: English Current Smoking Status: Patient smokes.   Tobacco Use Assessment Completed: Used Tobacco in last 30 days? Drinks 1 caffeinated drink per day.    REVIEW OF SYSTEMS:    GU Review Male:   Patient denies frequent urination, hard to postpone urination, burning/ pain with urination, get up at night to  urinate, leakage of urine, stream starts and stops, trouble starting your stream, have to strain to urinate , erection problems, and penile pain.  Gastrointestinal (Upper):   Patient denies nausea, vomiting, and indigestion/ heartburn.  Gastrointestinal (Lower):   Patient denies diarrhea and  constipation.  Constitutional:   Patient denies fever, night sweats, weight loss, and fatigue.  Skin:   Patient denies skin rash/ lesion and itching.  Eyes:   Patient denies blurred vision and double vision.  Ears/ Nose/ Throat:   Patient denies sinus problems and sore throat.  Hematologic/Lymphatic:   Patient denies swollen glands and easy bruising.  Cardiovascular:   Patient denies leg swelling and chest pains.  Respiratory:   Patient denies cough and shortness of breath.  Endocrine:   Patient denies excessive thirst.  Musculoskeletal:   Patient denies back pain and joint pain.  Neurological:   Patient denies headaches and dizziness.  Psychologic:   Patient denies depression and anxiety.   VITAL SIGNS: None   GU PHYSICAL EXAMINATION:    Penis: Foley draining clear yellow urine   MULTI-SYSTEM PHYSICAL EXAMINATION:       Complexity of Data:  Source Of History:  Patient  Records Review:   Previous Doctor Records, Previous Patient Records   11/30/21  PSA  Total PSA 2.60 ng/mL    PROCEDURES:         Simple Foley Indwelling Cath Change - 51702  The patient's indwelling foley tube was carefully removed. A 18 French Foley COUDE catheter was inserted into the bladder using sterile technique. urojet PRIOR. The patient was taught routine catheter care. Hand irrigation of the bladder with sterile water was performed. A leg bag was connected. A urine culture was sent to the lab. 100 cc of urine was obtained.   ASSESSMENT:      ICD-10 Details  1 GU:   BPH w/LUTS - N40.1 Chronic, Stable  2   Urinary Retention - R33.8 Chronic, Stable   PLAN:           Orders Labs Urine Culture CATH          Schedule Procedure: Unspecified Date - Cystoscopy TURP - 33825          Document Letter(s):  Created for Patient: Clinical Summary         Notes:   The patient has failed medical management for his lower urinary tract symptoms. He would like to proceed with surgical resection. I discussed  bipolar transurethral resection of the prostate. I specifically discussed the risks including but not limited to bleeding which could require blood transfusion, infection, and injury to surrounding structures. Also discussed the possibility that the surgery would not improve symptoms though most men have a great improvement in their symptoms. Also discussed the low likelihood but possibility of development of new symptoms such as irritative voiding symptoms or urinary incontinence. Most men will have some degree of urinary urgency and discomfort immediately following the surgery that resolves in a short amount of time. He understands that most often this is an outpatient procedure but occasionally patients require hospitalization for continuous bladder irrigation in the event of excess bleeding. He also understands the possibility of being sent home with a urethral catheter. The patient expressed understanding and is eager to proceed.   We were not able to obtain urodynamics. Possibility of failure discussed. Possibility of urinary incontinence discussed. He will need clearance to come off the Eliquis   CC: Dr. Kenton Kingfisher  Next Appointment:      Next Appointment: 03/11/2022 09:00 AM    Appointment Type: Surgery     Location: [facility] [phone]    Provider: Link Snuffer, III, M.D.    Reason for Visit: OBS WL TURP     Signed by Link Snuffer, III, M.D. on 02/21/22 at 4:16 PM (EDT

## 2022-03-18 NOTE — Progress Notes (Signed)
Mobility Specialist - Progress Note   03/18/22 1700  Mobility  HOB Elevated/Bed Position Self regulated  Activity Ambulated independently in hallway  Range of Motion/Exercises Active  Level of Assistance Standby assist, set-up cues, supervision of patient - no hands on  Assistive Device None  Distance Ambulated (ft) 400 ft  Activity Response Tolerated well  $Mobility charge 1 Mobility   Pt was found in bed and agreeable to mobilize. Pt had no complaints ambulating and at EOS returned to bed with all necessities in reach.  Ferd Hibbs Mobility Specialist

## 2022-03-18 NOTE — Transfer of Care (Signed)
Immediate Anesthesia Transfer of Care Note  Patient: Corey Meyer  Procedure(s) Performed: TRANSURETHRAL RESECTION OF THE PROSTATE (TURP)  Patient Location: PACU  Anesthesia Type:General  Level of Consciousness: awake, alert  and patient cooperative  Airway & Oxygen Therapy: Patient Spontanous Breathing and Patient connected to face mask oxygen  Post-op Assessment: Report given to RN and Post -op Vital signs reviewed and stable  Post vital signs: Reviewed and stable  Last Vitals:  Vitals Value Taken Time  BP 125/86 03/18/22 1241  Temp    Pulse 81 03/18/22 1243  Resp 15 03/18/22 1243  SpO2 100 % 03/18/22 1243  Vitals shown include unvalidated device data.  Last Pain:  Vitals:   03/18/22 0930  TempSrc:   PainSc: 0-No pain         Complications: No notable events documented.

## 2022-03-18 NOTE — Discharge Instructions (Signed)

## 2022-03-18 NOTE — Op Note (Signed)
Preoperative diagnosis: Bladder outlet obstruction secondary to BPH  Postoperative diagnosis:  Bladder outlet obstruction secondary to BPH  Procedure:  Cystoscopy Transurethral resection of the prostate Cystolitholapaxy, 1.5 cm  Surgeon: Marton Redwood, III. M.D.  Anesthesia: General  Complications: None  EBL: Minimal  Specimens: Prostate chips  Indication: Corey Meyer is a patient with bladder outlet obstruction secondary to benign prostatic hyperplasia and urinary retention.  We were unable to pass a urodynamics line into the bladder to perform urodynamics.. After reviewing the management options for treatment, he elected to proceed with the above surgical procedure(s). We have discussed the potential benefits and risks of the procedure, side effects of the proposed treatment, the likelihood of the patient achieving the goals of the procedure, and any potential problems that might occur during the procedure or recuperation. Informed consent has been obtained.  Description of procedure:  The patient was taken to the operating room and general anesthesia was induced.  The patient was placed in the dorsal lithotomy position, prepped and draped in the usual sterile fashion, and preoperative antibiotics were administered. A preoperative time-out was performed.   Cystourethroscopy was performed.  The patient's urethra was examined and demonstrated bilobar prostatic hypertrophy.  There was also noted to be a large false passage just beyond the membranous urethra underneath the prostate.  Looked chronic in nature.  The bladder was then systematically examined in its entirety. There was no evidence of any bladder tumors.  He did have a diverticulum on the left posterior lateral wall.  Within the diverticulum there was an approximate 1.5 cm stone.  I was unable to extract it through the scope.  The ureteral orifices were identified and marked so as to be avoided during the procedure.  The  prostate adenoma was then resected utilizing loop cautery resection with the bipolar cutting loop.  The prostate adenoma from the bladder neck back to the verumontanum was resected beginning at the six o'clock position and then extended to include the right and left lobes of the prostate and anterior prostate. Care was taken not to resect distal to the verumontanum.  He did have a wide open false passage at the level of the verumontanum extending underneath the prostate.  Hemostasis was then achieved with the cautery and the bladder was emptied and reinspected with no significant bleeding noted at the end of the procedure.    I then exchanged for a bridge element.  I advanced a laser fiber through the scope and laser fragmented the stone to smaller fragments and then evacuated the stone through the scope.  I reinspected the bladder and prostate.  There was wide open channel and again there was the wide open chronic false channel.  I passed a wire through the scope and into the bladder.  I withdrew the scope and then placed a 24 French 3  way catheter into the bladder.  The patient appeared to tolerate the procedure well and without complications.  The patient was able to be awakened and transferred to the recovery unit in satisfactory condition.

## 2022-03-19 ENCOUNTER — Encounter (HOSPITAL_COMMUNITY): Payer: Self-pay | Admitting: Urology

## 2022-03-19 DIAGNOSIS — N401 Enlarged prostate with lower urinary tract symptoms: Secondary | ICD-10-CM | POA: Diagnosis not present

## 2022-03-19 LAB — SURGICAL PATHOLOGY

## 2022-03-19 LAB — GLUCOSE, CAPILLARY: Glucose-Capillary: 137 mg/dL — ABNORMAL HIGH (ref 70–99)

## 2022-03-19 MED ORDER — OXYCODONE HCL 5 MG PO TABS: 5.0000 mg | ORAL_TABLET | ORAL | 0 refills | Status: DC | PRN

## 2022-03-19 NOTE — Progress Notes (Signed)
RN provided and explained discharge instructions. IV removed. Foley catheter plugged and changed to leg bag per pt request. RN used teach back method for foley care at home. All questions answered. Pt dressed in personal clothing awaiting ride home.

## 2022-03-19 NOTE — Discharge Summary (Signed)
Physician Discharge Summary  Patient ID: Corey Meyer MRN: [id number] DOB/AGE: [id number] 69 y.o.  Admit date: 03/18/2022 Discharge date: 03/19/2022  Admission Diagnoses:  Discharge Diagnoses:  Principal Problem:   BPH (benign prostatic hyperplasia)   Discharged Condition: good  Hospital Course: Patient underwent a TURP.  He remained overnight for observation.  The following day, his urine was clear yellow off CBI.  He was discharged with his Foley catheter.  Consults: None  Significant Diagnostic Studies: None  Treatments: surgery: TURP  Discharge Exam: Blood pressure 111/89, pulse 87, temperature 98.3 F (36.8 C), temperature source Oral, resp. rate 20, height 5\' 9"  (1.753 m), weight 90.7 kg, SpO2 100 %. General appearance: alert no acute distress Adequate perfusion of extremities Nonlabored respiration Abdomen soft, nontender, nondistended Foley catheter in place draining clear yellow urine  Disposition: Discharge disposition: 01-Home or Self Care        Allergies as of 03/19/2022       Reactions   Haldol [haloperidol Lactate] Swelling, Other (See Comments)   Xerostomia        Medication List     TAKE these medications    carvedilol 6.25 MG tablet Commonly known as: Coreg Take 1 tablet (6.25 mg total) by mouth 2 (two) times daily.   folic acid 1 MG tablet Commonly known as: FOLVITE Take 1 mg by mouth daily.   glipiZIDE 5 MG tablet Commonly known as: GLUCOTROL Take 1 tablet (5 mg total) by mouth daily before breakfast.   metFORMIN 1000 MG tablet Commonly known as: GLUCOPHAGE Take 1,000 mg by mouth 2 (two) times daily with a meal.   montelukast 10 MG tablet Commonly known as: SINGULAIR Take 10 mg by mouth at bedtime.   omeprazole 20 MG capsule Commonly known as: PRILOSEC Take 20 mg by mouth daily as needed (indigestion).   oxyCODONE 5 MG immediate release tablet Commonly known as: Oxy IR/ROXICODONE Take 1 tablet (5 mg total) by mouth  every 4 (four) hours as needed for moderate pain.   potassium chloride SA 20 MEQ tablet Commonly known as: KLOR-CON M Take 1 tablet (20 mEq total) by mouth daily.   sildenafil 50 MG tablet Commonly known as: VIAGRA Take 50 mg by mouth daily as needed for erectile dysfunction.   tamsulosin 0.4 MG Caps capsule Commonly known as: FLOMAX Take 0.4 mg by mouth at bedtime.         Signed: Marton Redwood, III 03/19/2022, 5:11 PM

## 2022-03-19 NOTE — Anesthesia Postprocedure Evaluation (Signed)
Anesthesia Post Note  Patient: Corey Meyer  Procedure(s) Performed: TRANSURETHRAL RESECTION OF THE PROSTATE (TURP), LASER LITHOLAPAXY     Patient location during evaluation: PACU Anesthesia Type: General Level of consciousness: awake and alert and oriented Pain management: pain level controlled Vital Signs Assessment: post-procedure vital signs reviewed and stable Respiratory status: spontaneous breathing, nonlabored ventilation and respiratory function stable Cardiovascular status: blood pressure returned to baseline and stable Postop Assessment: no apparent nausea or vomiting Anesthetic complications: no   No notable events documented.                 Juanell Saffo A.

## 2022-03-19 NOTE — TOC Transition Note (Signed)
Transition of Care [facility]) - CM/SW Discharge Note   Patient Details  Name: Corey Meyer MRN: [id number] Date of Birth: 03-20-1951  Transition of Care [facility] CM/SW Contact:  Henrietta Dine, RN Phone Number: 03/19/2022, 9:23 AM   Clinical Narrative:  pt d/c home; no HH neds; no ordered; no TOC needs.          Patient Goals and CMS Choice        Discharge Placement                       Discharge Plan and Services                                     Social Determinants of Health (SDOH) Interventions     Readmission Risk Interventions    03/11/2022   11:58 AM  Readmission Risk Prevention Plan  PCP or Specialist Appt within 3-5 Days Complete  HRI or [place] Complete  Social Work Consult for [place] Planning/Counseling Complete  Palliative Care Screening Not Applicable  Medication Review Press photographer) Complete

## 2022-03-19 NOTE — Plan of Care (Signed)
  Problem: Coping: Goal: Ability to adjust to condition or change in health will improve Outcome: Progressing   Problem: Fluid Volume: Goal: Ability to maintain a balanced intake and output will improve Outcome: Progressing   Problem: Health Behavior/Discharge Planning: Goal: Ability to identify and utilize available resources and services will improve Outcome: Progressing Goal: Ability to manage health-related needs will improve Outcome: Progressing   Problem: Metabolic: Goal: Ability to maintain appropriate glucose levels will improve Outcome: Progressing   Problem: Skin Integrity: Goal: Risk for impaired skin integrity will decrease Outcome: Progressing   Problem: Tissue Perfusion: Goal: Adequacy of tissue perfusion will improve Outcome: Progressing   Problem: Education: Goal: Knowledge of the prescribed therapeutic regimen will improve Outcome: Progressing   Problem: Skin Integrity: Goal: Demonstration of wound healing without infection will improve Outcome: Progressing   Problem: Urinary Elimination: Goal: Ability to avoid or minimize complications of infection will improve Outcome: Progressing   Problem: Education: Goal: Knowledge of General Education information will improve Description: Including pain rating scale, medication(s)/side effects and non-pharmacologic comfort measures Outcome: Progressing   Problem: Health Behavior/Discharge Planning: Goal: Ability to manage health-related needs will improve Outcome: Progressing   Problem: Clinical Measurements: Goal: Respiratory complications will improve Outcome: Progressing   Problem: Education: Goal: Ability to describe self-care measures that may prevent or decrease complications (Diabetes Survival Skills Education) will improve Outcome: Completed/Met   Problem: Nutritional: Goal: Maintenance of adequate nutrition will improve Outcome: Completed/Met

## 2022-12-30 ENCOUNTER — Other Ambulatory Visit: Payer: Self-pay

## 2022-12-30 ENCOUNTER — Emergency Department (HOSPITAL_COMMUNITY)
Admission: EM | Admit: 2022-12-30 | Discharge: 2022-12-30 | Disposition: A | Discharge status: Home / Self Care | Payer: No Typology Code available for payment source | Attending: Emergency Medicine | Admitting: Emergency Medicine

## 2022-12-30 ENCOUNTER — Emergency Department (HOSPITAL_COMMUNITY): Payer: No Typology Code available for payment source

## 2022-12-30 DIAGNOSIS — E860 Dehydration: Secondary | ICD-10-CM | POA: Diagnosis not present

## 2022-12-30 DIAGNOSIS — E871 Hypo-osmolality and hyponatremia: Secondary | ICD-10-CM | POA: Insufficient documentation

## 2022-12-30 DIAGNOSIS — F10239 Alcohol dependence with withdrawal, unspecified: Secondary | ICD-10-CM | POA: Insufficient documentation

## 2022-12-30 DIAGNOSIS — Y900 Blood alcohol level of less than 20 mg/100 ml: Secondary | ICD-10-CM | POA: Insufficient documentation

## 2022-12-30 DIAGNOSIS — I2510 Atherosclerotic heart disease of native coronary artery without angina pectoris: Secondary | ICD-10-CM | POA: Diagnosis not present

## 2022-12-30 DIAGNOSIS — Z79899 Other long term (current) drug therapy: Secondary | ICD-10-CM | POA: Diagnosis not present

## 2022-12-30 DIAGNOSIS — D649 Anemia, unspecified: Secondary | ICD-10-CM | POA: Insufficient documentation

## 2022-12-30 DIAGNOSIS — I10 Essential (primary) hypertension: Secondary | ICD-10-CM | POA: Insufficient documentation

## 2022-12-30 DIAGNOSIS — E1165 Type 2 diabetes mellitus with hyperglycemia: Secondary | ICD-10-CM | POA: Diagnosis not present

## 2022-12-30 DIAGNOSIS — N3001 Acute cystitis with hematuria: Secondary | ICD-10-CM | POA: Diagnosis not present

## 2022-12-30 DIAGNOSIS — Z7984 Long term (current) use of oral hypoglycemic drugs: Secondary | ICD-10-CM | POA: Insufficient documentation

## 2022-12-30 DIAGNOSIS — N179 Acute kidney failure, unspecified: Secondary | ICD-10-CM | POA: Insufficient documentation

## 2022-12-30 DIAGNOSIS — R000 Tachycardia, unspecified: Secondary | ICD-10-CM | POA: Diagnosis not present

## 2022-12-30 DIAGNOSIS — E878 Other disorders of electrolyte and fluid balance, not elsewhere classified: Secondary | ICD-10-CM | POA: Diagnosis not present

## 2022-12-30 DIAGNOSIS — F10930 Alcohol use, unspecified with withdrawal, uncomplicated: Secondary | ICD-10-CM

## 2022-12-30 LAB — URINALYSIS, ROUTINE W REFLEX MICROSCOPIC
Bilirubin Urine: NEGATIVE
Glucose, UA: NEGATIVE mg/dL
Ketones, ur: 80 mg/dL — AB
Nitrite: NEGATIVE
Protein, ur: 100 mg/dL — AB
Specific Gravity, Urine: 1.023 (ref 1.005–1.030)
WBC, UA: >50 WBC/hpf (ref 0–5)
pH: 5 (ref 5.0–8.0)

## 2022-12-30 LAB — COMPREHENSIVE METABOLIC PANEL
ALT: 89 U/L — ABNORMAL HIGH (ref 0–44)
AST: 92 U/L — ABNORMAL HIGH (ref 15–41)
Albumin: 3.8 g/dL (ref 3.5–5.0)
Alkaline Phosphatase: 93 U/L (ref 38–126)
Anion gap: 18 — ABNORMAL HIGH (ref 5–15)
BUN: 22 mg/dL (ref 8–23)
CO2: 20 mmol/L — ABNORMAL LOW (ref 22–32)
Calcium: 9.4 mg/dL (ref 8.9–10.3)
Chloride: 92 mmol/L — ABNORMAL LOW (ref 98–111)
Creatinine, Ser: 1.82 mg/dL — ABNORMAL HIGH (ref 0.61–1.24)
GFR, Estimated: 40 mL/min — ABNORMAL LOW (ref >60)
Glucose, Bld: 166 mg/dL — ABNORMAL HIGH (ref 70–99)
Potassium: 4.5 mmol/L (ref 3.5–5.1)
Sodium: 130 mmol/L — ABNORMAL LOW (ref 135–145)
Total Bilirubin: 1.6 mg/dL — ABNORMAL HIGH (ref 0.3–1.2)
Total Protein: 7.4 g/dL (ref 6.5–8.1)

## 2022-12-30 LAB — TROPONIN I (HIGH SENSITIVITY)
Troponin I (High Sensitivity): 83 ng/L — ABNORMAL HIGH (ref <18)
Troponin I (High Sensitivity): 86 ng/L — ABNORMAL HIGH (ref <18)

## 2022-12-30 LAB — RAPID URINE DRUG SCREEN, HOSP PERFORMED
Amphetamines: NOT DETECTED
Barbiturates: NOT DETECTED
Benzodiazepines: NOT DETECTED
Cocaine: NOT DETECTED
Opiates: NOT DETECTED
Tetrahydrocannabinol: NOT DETECTED

## 2022-12-30 LAB — CBC
HCT: 39.5 % (ref 39.0–52.0)
Hemoglobin: 12.4 g/dL — ABNORMAL LOW (ref 13.0–17.0)
MCH: 28.9 pg (ref 26.0–34.0)
MCHC: 31.4 g/dL (ref 30.0–36.0)
MCV: 92.1 fL (ref 80.0–100.0)
Platelets: 202 10*3/uL (ref 150–400)
RBC: 4.29 MIL/uL (ref 4.22–5.81)
RDW: 18.2 % — ABNORMAL HIGH (ref 11.5–15.5)
WBC: 11.6 10*3/uL — ABNORMAL HIGH (ref 4.0–10.5)
nRBC: 0 % (ref 0.0–0.2)

## 2022-12-30 LAB — ETHANOL: Alcohol, Ethyl (B): <10 mg/dL (ref <10)

## 2022-12-30 LAB — LIPASE, BLOOD: Lipase: 43 U/L (ref 11–51)

## 2022-12-30 MED ORDER — LORAZEPAM 1 MG PO TABS: 1.0000 mg | ORAL_TABLET | ORAL | Status: DC | PRN

## 2022-12-30 MED ORDER — SODIUM CHLORIDE 0.9 % IV SOLN
1.0000 g | Freq: Once | INTRAVENOUS | Status: AC
  Administered 2022-12-30: 1 g via INTRAVENOUS
  Filled 2022-12-30: qty 10

## 2022-12-30 MED ORDER — PANTOPRAZOLE SODIUM 40 MG IV SOLR
40.0000 mg | Freq: Once | INTRAVENOUS | Status: AC
  Administered 2022-12-30: 40 mg via INTRAVENOUS
  Filled 2022-12-30: qty 10

## 2022-12-30 MED ORDER — ONDANSETRON HCL 4 MG/2ML IJ SOLN
4.0000 mg | Freq: Once | INTRAMUSCULAR | Status: AC
  Administered 2022-12-30: 4 mg via INTRAVENOUS
  Filled 2022-12-30: qty 2

## 2022-12-30 MED ORDER — CIPROFLOXACIN HCL 500 MG PO TABS: 500.0000 mg | ORAL_TABLET | Freq: Two times a day (BID) | ORAL | 0 refills | Status: AC

## 2022-12-30 MED ORDER — ADULT MULTIVITAMIN W/MINERALS CH
1.0000 | ORAL_TABLET | Freq: Every day | ORAL | Status: DC
  Administered 2022-12-30: 1 via ORAL
  Filled 2022-12-30: qty 1

## 2022-12-30 MED ORDER — ONDANSETRON HCL 4 MG PO TABS: 4.0000 mg | ORAL_TABLET | Freq: Three times a day (TID) | ORAL | 0 refills | Status: AC | PRN

## 2022-12-30 MED ORDER — PANTOPRAZOLE SODIUM 20 MG PO TBEC: 20.0000 mg | DELAYED_RELEASE_TABLET | Freq: Every day | ORAL | 0 refills | Status: DC

## 2022-12-30 MED ORDER — LORAZEPAM 1 MG PO TABS
1.0000 mg | ORAL_TABLET | Freq: Once | ORAL | Status: AC
  Administered 2022-12-30: 1 mg via ORAL
  Filled 2022-12-30: qty 1

## 2022-12-30 MED ORDER — LACTATED RINGERS IV BOLUS
1500.0000 mL | Freq: Once | INTRAVENOUS | Status: AC
  Administered 2022-12-30: 1500 mL via INTRAVENOUS

## 2022-12-30 MED ORDER — CHLORDIAZEPOXIDE HCL 25 MG PO CAPS: ORAL_CAPSULE | ORAL | 0 refills | Status: DC

## 2022-12-30 MED ORDER — THIAMINE MONONITRATE 100 MG PO TABS
100.0000 mg | ORAL_TABLET | Freq: Every day | ORAL | Status: DC
  Administered 2022-12-30: 100 mg via ORAL
  Filled 2022-12-30: qty 1

## 2022-12-30 MED ORDER — FOLIC ACID 1 MG PO TABS
1.0000 mg | ORAL_TABLET | Freq: Every day | ORAL | Status: DC
  Administered 2022-12-30: 1 mg via ORAL
  Filled 2022-12-30: qty 1

## 2022-12-30 MED ORDER — THIAMINE HCL 100 MG/ML IJ SOLN: 100.0000 mg | Freq: Every day | INTRAMUSCULAR | Status: DC

## 2022-12-30 NOTE — Discharge Instructions (Addendum)
Thank you for letting us take care of you today.  We treated you for alcohol withdrawal.  You did appear dehydrated so we gave you fluids and medications to help with this. Your urine appeared infected. We gave you IV antibiotics to treat this.  I am also sending you home on antibiotics to take at home to continue treating this.  I will send you home with medication to help you as you stop using alcohol as well.  Please take this as instructed.  Our case management team provided resources if you are interested in rehab facilities.  If not, I do recommend following up closely with primary care.  I provided 2 clinics that you may establish care with.  I provided other medications to help with nausea as needed.  For any new or worsening symptoms such as as we discussed including uncontrolled vomiting, seizure-like activity, confusion, difficulty urinating, abdominal pain, fever, or other new, concerning symptoms, return to the nearest ED for reevaluation.

## 2022-12-30 NOTE — ED Triage Notes (Signed)
Pt to ED c/o ETOH withdrawal , reports drank a total of 2.5 gallons of bourbon yesterday and the day prior, today c/o abdominal pain, shakes, sweats, reports started "early this morning". Reports he is a binge drinker. A&O X 4 in triage. Able to tolerate fluids, reports drinking water today.
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
## 2022-12-30 NOTE — ED Notes (Signed)
Pt refused to try & give urine sample in triage.

## 2023-09-17 ENCOUNTER — Other Ambulatory Visit (HOSPITAL_COMMUNITY): Payer: Self-pay

## 2023-09-17 ENCOUNTER — Emergency Department (HOSPITAL_COMMUNITY)
Admission: EM | Admit: 2023-09-17 | Discharge: 2023-09-17 | Disposition: A | Discharge status: Home / Self Care | Attending: Emergency Medicine | Admitting: Emergency Medicine

## 2023-09-17 DIAGNOSIS — I2510 Atherosclerotic heart disease of native coronary artery without angina pectoris: Secondary | ICD-10-CM | POA: Diagnosis not present

## 2023-09-17 DIAGNOSIS — F1090 Alcohol use, unspecified, uncomplicated: Secondary | ICD-10-CM | POA: Diagnosis present

## 2023-09-17 DIAGNOSIS — I10 Essential (primary) hypertension: Secondary | ICD-10-CM | POA: Diagnosis not present

## 2023-09-17 DIAGNOSIS — Y906 Blood alcohol level of 120-199 mg/100 ml: Secondary | ICD-10-CM | POA: Insufficient documentation

## 2023-09-17 DIAGNOSIS — E119 Type 2 diabetes mellitus without complications: Secondary | ICD-10-CM | POA: Insufficient documentation

## 2023-09-17 DIAGNOSIS — Z7901 Long term (current) use of anticoagulants: Secondary | ICD-10-CM | POA: Insufficient documentation

## 2023-09-17 DIAGNOSIS — Z79899 Other long term (current) drug therapy: Secondary | ICD-10-CM | POA: Diagnosis not present

## 2023-09-17 DIAGNOSIS — F1010 Alcohol abuse, uncomplicated: Secondary | ICD-10-CM | POA: Insufficient documentation

## 2023-09-17 DIAGNOSIS — Z7984 Long term (current) use of oral hypoglycemic drugs: Secondary | ICD-10-CM | POA: Diagnosis not present

## 2023-09-17 LAB — COMPREHENSIVE METABOLIC PANEL
ALT: 67 U/L — ABNORMAL HIGH (ref 0–44)
AST: 181 U/L — ABNORMAL HIGH (ref 15–41)
Albumin: 3.3 g/dL — ABNORMAL LOW (ref 3.5–5.0)
Alkaline Phosphatase: 70 U/L (ref 38–126)
Anion gap: 17 — ABNORMAL HIGH (ref 5–15)
BUN: 7 mg/dL — ABNORMAL LOW (ref 8–23)
CO2: 21 mmol/L — ABNORMAL LOW (ref 22–32)
Calcium: 8.7 mg/dL — ABNORMAL LOW (ref 8.9–10.3)
Chloride: 97 mmol/L — ABNORMAL LOW (ref 98–111)
Creatinine, Ser: 1.16 mg/dL (ref 0.61–1.24)
GFR, Estimated: >60 mL/min (ref >60)
Glucose, Bld: 190 mg/dL — ABNORMAL HIGH (ref 70–99)
Potassium: 3.4 mmol/L — ABNORMAL LOW (ref 3.5–5.1)
Sodium: 135 mmol/L (ref 135–145)
Total Bilirubin: 1.3 mg/dL — ABNORMAL HIGH (ref 0.0–1.2)
Total Protein: 6.9 g/dL (ref 6.5–8.1)

## 2023-09-17 LAB — CBC
HCT: 35.4 % — ABNORMAL LOW (ref 39.0–52.0)
Hemoglobin: 12.2 g/dL — ABNORMAL LOW (ref 13.0–17.0)
MCH: 33 pg (ref 26.0–34.0)
MCHC: 34.5 g/dL (ref 30.0–36.0)
MCV: 95.7 fL (ref 80.0–100.0)
Platelets: 156 10*3/uL (ref 150–400)
RBC: 3.7 MIL/uL — ABNORMAL LOW (ref 4.22–5.81)
RDW: 17 % — ABNORMAL HIGH (ref 11.5–15.5)
WBC: 3.7 10*3/uL — ABNORMAL LOW (ref 4.0–10.5)
nRBC: 0.5 % — ABNORMAL HIGH (ref 0.0–0.2)

## 2023-09-17 LAB — LIPASE, BLOOD: Lipase: 89 U/L — ABNORMAL HIGH (ref 11–51)

## 2023-09-17 LAB — MAGNESIUM: Magnesium: 1.2 mg/dL — ABNORMAL LOW (ref 1.7–2.4)

## 2023-09-17 LAB — CBG MONITORING, ED: Glucose-Capillary: 186 mg/dL — ABNORMAL HIGH (ref 70–99)

## 2023-09-17 LAB — ETHANOL: Alcohol, Ethyl (B): 122 mg/dL — ABNORMAL HIGH (ref <10)

## 2023-09-17 MED ORDER — ADULT MULTIVITAMIN W/MINERALS CH
1.0000 | ORAL_TABLET | Freq: Every day | ORAL | Status: DC
  Administered 2023-09-17: 1 via ORAL
  Filled 2023-09-17: qty 1

## 2023-09-17 MED ORDER — THIAMINE HCL 100 MG/ML IJ SOLN: 100.0000 mg | Freq: Every day | INTRAMUSCULAR | Status: DC

## 2023-09-17 MED ORDER — MAGNESIUM SULFATE 2 GM/50ML IV SOLN
2.0000 g | Freq: Once | INTRAVENOUS | Status: AC
  Administered 2023-09-17: 2 g via INTRAVENOUS
  Filled 2023-09-17: qty 50

## 2023-09-17 MED ORDER — CHLORDIAZEPOXIDE HCL 25 MG PO CAPS
ORAL_CAPSULE | ORAL | 0 refills | Status: AC
  Filled 2023-09-17: qty 10, 3d supply, fill #0

## 2023-09-17 MED ORDER — ONDANSETRON HCL 4 MG/2ML IJ SOLN
4.0000 mg | Freq: Once | INTRAMUSCULAR | Status: AC
  Administered 2023-09-17: 4 mg via INTRAVENOUS
  Filled 2023-09-17: qty 2

## 2023-09-17 MED ORDER — FOLIC ACID 1 MG PO TABS
1.0000 mg | ORAL_TABLET | Freq: Every day | ORAL | Status: DC
  Administered 2023-09-17: 1 mg via ORAL
  Filled 2023-09-17: qty 1

## 2023-09-17 MED ORDER — POTASSIUM CHLORIDE CRYS ER 20 MEQ PO TBCR
40.0000 meq | EXTENDED_RELEASE_TABLET | Freq: Once | ORAL | Status: AC
  Administered 2023-09-17: 40 meq via ORAL
  Filled 2023-09-17: qty 2

## 2023-09-17 MED ORDER — THIAMINE MONONITRATE 100 MG PO TABS
100.0000 mg | ORAL_TABLET | Freq: Every day | ORAL | Status: DC
  Administered 2023-09-17: 100 mg via ORAL
  Filled 2023-09-17: qty 1

## 2023-09-17 NOTE — Discharge Instructions (Addendum)
 It was a pleasure caring for you today. Please follow up with your primary care provider. Seek emergency care if experiencing any new or worsening symptoms.

## 2023-09-17 NOTE — ED Notes (Signed)
 Blue, light green, dark green, and lavender tubes sent to lab

## 2023-09-17 NOTE — ED Provider Notes (Signed)
 [place] EMERGENCY DEPARTMENT AT [facility] Provider Note   CSN: [id number] Arrival date & time: 09/17/23  1012     History  Chief Complaint  Patient presents with   Alcohol Problem    Corey Meyer is a 73 y.o. male with PMHx alcohol use disorder, BPH, DM, HTN, HLD, CAD who presents to ED for ETOH detox. Patient endorsing fifth of liquor daily for 10+ years. States that he has tried to detox before, which has never been complicated with seizures. Last drink around 8AM. Patient denies any infectious symptoms today, but states that he has not had much of an appetite x1 week.   Denies fever, chest pain, dyspnea, cough, nausea, vomiting, diarrhea.    Alcohol Problem       Home Medications Prior to Admission medications   Medication Sig Start Date End Date Taking? Authorizing Provider  apixaban (ELIQUIS) 5 MG TABS tablet Take 5 mg by mouth 2 (two) times daily. Patient not taking: Reported on 09/17/2023 11/26/22   [provider]  sacubitril-valsartan (ENTRESTO) 49-51 MG Take 1 tablet by mouth 2 (two) times daily. Patient not taking: Reported on 09/17/2023 10/18/22   [provider]  simvastatin (ZOCOR) 20 MG tablet Take 20 mg by mouth daily at 6 PM. Patient not taking: Reported on 09/17/2023 11/26/22   [provider]  carvedilol (COREG) 6.25 MG tablet Take 1 tablet (6.25 mg total) by mouth 2 (two) times daily. Patient not taking: Reported on 09/17/2023 01/04/22 01/04/23  Maretta Bees, MD  chlordiazePOXIDE (LIBRIUM) 25 MG capsule 50mg  PO TID x 1D, then 25-50mg  PO BID X 1D, then 25-50mg  PO QD X 1D Patient not taking: Reported on 09/17/2023 12/30/22   Ysidro Evert L, PA-C  folic acid (FOLVITE) 1 MG tablet Take 1 mg by mouth daily. Patient not taking: Reported on 09/17/2023    [provider]  glipiZIDE (GLUCOTROL) 5 MG tablet Take 1 tablet (5 mg total) by mouth daily before breakfast. Patient not taking: Reported on 09/17/2023 03/11/22    Alberteen Sam, MD  metFORMIN (GLUCOPHAGE) 1000 MG tablet Take 1,000 mg by mouth 2 (two) times daily with a meal. Patient not taking: Reported on 09/17/2023    [provider]  montelukast (SINGULAIR) 10 MG tablet Take 10 mg by mouth at bedtime. Patient not taking: Reported on 09/17/2023    [provider]  omeprazole (PRILOSEC) 20 MG capsule Take 20 mg by mouth daily as needed (indigestion). Patient not taking: Reported on 09/17/2023    [provider]  pantoprazole (PROTONIX) 20 MG tablet Take 1 tablet (20 mg total) by mouth daily for 5 days. Patient not taking: Reported on 09/17/2023 12/30/22 01/04/23  Ysidro Evert L, PA-C  potassium chloride SA (KLOR-CON M) 20 MEQ tablet Take 1 tablet (20 mEq total) by mouth daily. Patient not taking: Reported on 09/17/2023 01/04/22   Maretta Bees, MD  tamsulosin (FLOMAX) 0.4 MG CAPS capsule Take 0.4 mg by mouth at bedtime. Patient not taking: Reported on 09/17/2023    [provider]  potassium chloride 20 MEQ TBCR Take 20 mEq by mouth daily. Patient not taking: Reported on 12/31/2021 01/06/19 01/04/22  Yates Decamp, MD      Allergies    Haldol [haloperidol lactate]    Review of Systems   Review of Systems  Psychiatric/Behavioral:         ETOH detox    Physical Exam Updated Vital Signs BP 102/82   Pulse 99   Temp  98.6 F (37 C) (Oral)   Resp 15   SpO2 100%  Physical Exam Vitals and nursing note reviewed.  Constitutional:      General: He is not in acute distress.    Appearance: He is not ill-appearing or toxic-appearing.  HENT:     Head: Normocephalic and atraumatic.     Mouth/Throat:     Mouth: Mucous membranes are moist.     Pharynx: No oropharyngeal exudate or posterior oropharyngeal erythema.  Eyes:     General: No scleral icterus.       Right eye: No discharge.        Left eye: No discharge.     Conjunctiva/sclera: Conjunctivae normal.  Cardiovascular:     Rate and Rhythm: Normal rate.      Pulses: Normal pulses.     Heart sounds: No murmur heard. Pulmonary:     Effort: Pulmonary effort is normal. No respiratory distress.     Breath sounds: No wheezing, rhonchi or rales.  Abdominal:     Tenderness: There is no abdominal tenderness.  Musculoskeletal:     Right lower leg: No edema.     Left lower leg: No edema.  Skin:    General: Skin is warm and dry.     Findings: No rash.  Neurological:     General: No focal deficit present.     Mental Status: He is alert. Mental status is at baseline.     Comments: Intermittent shaking/tremor of left hand that stops when patient starts talking to me. No other concern for ataxia.   GCS 15. Speech is goal oriented. No deficits appreciated to CN III-XII. Patient ambulatory with steady gait.  Psychiatric:        Mood and Affect: Mood normal.     ED Results / Procedures / Treatments   Labs (all labs ordered are listed, but only abnormal results are displayed) Labs Reviewed  COMPREHENSIVE METABOLIC PANEL - Abnormal; Notable for the following components:      Result Value   Potassium 3.4 (*)    Chloride 97 (*)    CO2 21 (*)    Glucose, Bld 190 (*)    BUN 7 (*)    Calcium 8.7 (*)    Albumin 3.3 (*)    AST 181 (*)    ALT 67 (*)    Total Bilirubin 1.3 (*)    Anion gap 17 (*)    All other components within normal limits  ETHANOL - Abnormal; Notable for the following components:   Alcohol, Ethyl (B) 122 (*)    All other components within normal limits  CBC - Abnormal; Notable for the following components:   WBC 3.7 (*)    RBC 3.70 (*)    Hemoglobin 12.2 (*)    HCT 35.4 (*)    RDW 17.0 (*)    nRBC 0.5 (*)    All other components within normal limits  MAGNESIUM - Abnormal; Notable for the following components:   Magnesium 1.2 (*)    All other components within normal limits  LIPASE, BLOOD - Abnormal; Notable for the following components:   Lipase 89 (*)    All other components within normal limits  CBG MONITORING, ED -  Abnormal; Notable for the following components:   Glucose-Capillary 186 (*)    All other components within normal limits    EKG EKG Interpretation Date/Time:  Wednesday September 17 2023 10:22:28 EDT Ventricular Rate:  102 PR Interval:  150 QRS Duration:  136  QT Interval:  391 QTC Calculation: 510 R Axis:   -53  Text Interpretation: Sinus tachycardia Atrial premature complex Nonspecific IVCD with LAD LVH with secondary repolarization abnormality No significant change since last tracing Confirmed by Gwyneth Sprout ([id number]) on 09/17/2023 12:20:15 PM  Radiology No results found.  Procedures Procedures    Medications Ordered in ED Medications  thiamine (VITAMIN B1) tablet 100 mg (100 mg Oral Given 09/17/23 1222)    Or  thiamine (VITAMIN B1) injection 100 mg ( Intravenous See Alternative 09/17/23 1222)  folic acid (FOLVITE) tablet 1 mg (1 mg Oral Given 09/17/23 1222)  multivitamin with minerals tablet 1 tablet (1 tablet Oral Given 09/17/23 1222)  ondansetron (ZOFRAN) injection 4 mg (4 mg Intravenous Given 09/17/23 1223)  potassium chloride SA (KLOR-CON M) CR tablet 40 mEq (40 mEq Oral Given 09/17/23 1222)  magnesium sulfate IVPB 2 g 50 mL (0 g Intravenous Stopped 09/17/23 1427)    ED Course/ Medical Decision Making/ A&P                                 Medical Decision Making Amount and/or Complexity of Data Reviewed Labs: ordered.  Risk OTC drugs. Prescription drug management.   This patient presents to the ED for concern of ETOH detox, this involves an extensive number of treatment options, and is a complaint that carries with it a high risk of complications and morbidity.  The differential diagnosis includes DT/seizure, ETOH intoxication, trauma   Co morbidities that complicate the patient evaluation  alcohol use disorder, BPH, DM, HTN, HLD, CAD    Additional history obtained:  PCP with [facility] clinic   Cardiac Monitoring: / EKG:  The patient was maintained  on a cardiac monitor.  I personally viewed and interpreted the cardiac monitored which showed an underlying rhythm of: sinus rhythm without acute changes from last tracing   Problem List / ED Course / Critical interventions / Medication management  Patient presents to ED for ETOH detox. Last drink around 8AM. Physical exam reassuring. Patient afebrile with stable vitals. Denies any infectious symptoms today. Patient tolerating PO intake in ED. I Ordered, and personally interpreted labs. Lipase elevated at 89 - patient tolerating PO intake and without abdominal tenderness on exam. ETOH elevated at 122 - patient speech goal oriented and they have stable gait and are clinically sober during interview. CBC with mild anemia with hgb at 12.2. no leukocytosis. Magnesium low at 1.2 - provided patient with 2g IV magnesium. CMP with mild hypokalemia at 3.4 - patient tolerated oral potassium supplementation well. AST/ALT elevated which seems to be d/t patient's recent ETOH intake given his reassuring physical exam. Patient's mild electrolyte abnormalities result in anion gap of 17 - I chose to move forward with oral hydration given patient's hx of CHF with  EF 30-35%. Patient is tolerating sandwich and water well in ED.  Offered patient Librium taper to help assist with ETOH detox. Patient accepted. Provided patient with a list of resources for detox programs and information for [facility]. Provided patient with folic acid, multivitamin, and thiamine which they tolerated well. Patient is overall well appearing and verbally agrees with plan. Recommended following up with PCP. Staffed with Dr. Anitra Lauth who agrees with plan. I have reviewed the patients home medicines and have made adjustments as needed Patient afebrile with stable vitals. Provided with return precautions. Discharged in good condition.   Social Determinants of Health:  geriatric  Final Clinical Impression(s) / ED Diagnoses Final  diagnoses:  Alcohol abuse    Rx / DC Orders ED Discharge Orders     None         Dorthy Cooler, [place] 09/17/23 1607    Gwyneth Sprout, MD 09/19/23 [phone]

## 2023-09-17 NOTE — ED Triage Notes (Signed)
 Pt states "I have an alcohol problem and I need to stop before I kill myself" pt states he drinks about a fifth of liquor over more than 10 years. Pt has attempted detox before denies seizures. Last drink about 0800

## 2023-10-02 ENCOUNTER — Other Ambulatory Visit (HOSPITAL_COMMUNITY): Payer: Self-pay

## 2023-11-03 ENCOUNTER — Inpatient Hospital Stay (HOSPITAL_COMMUNITY)
Admission: EM | Admit: 2023-11-03 | Discharge: 2023-11-08 | DRG: 371 | Disposition: A | Discharge status: Home / Self Care | Attending: Internal Medicine | Admitting: Internal Medicine

## 2023-11-03 ENCOUNTER — Other Ambulatory Visit: Payer: Self-pay

## 2023-11-03 ENCOUNTER — Encounter (HOSPITAL_COMMUNITY): Payer: Self-pay

## 2023-11-03 DIAGNOSIS — E1121 Type 2 diabetes mellitus with diabetic nephropathy: Secondary | ICD-10-CM | POA: Diagnosis present

## 2023-11-03 DIAGNOSIS — I482 Chronic atrial fibrillation, unspecified: Secondary | ICD-10-CM | POA: Diagnosis present

## 2023-11-03 DIAGNOSIS — D500 Iron deficiency anemia secondary to blood loss (chronic): Secondary | ICD-10-CM | POA: Diagnosis present

## 2023-11-03 DIAGNOSIS — E872 Acidosis, unspecified: Secondary | ICD-10-CM | POA: Diagnosis present

## 2023-11-03 DIAGNOSIS — K7460 Unspecified cirrhosis of liver: Secondary | ICD-10-CM | POA: Diagnosis present

## 2023-11-03 DIAGNOSIS — E7800 Pure hypercholesterolemia, unspecified: Secondary | ICD-10-CM | POA: Diagnosis present

## 2023-11-03 DIAGNOSIS — F1010 Alcohol abuse, uncomplicated: Secondary | ICD-10-CM | POA: Diagnosis present

## 2023-11-03 DIAGNOSIS — A0472 Enterocolitis due to Clostridium difficile, not specified as recurrent: Secondary | ICD-10-CM | POA: Diagnosis not present

## 2023-11-03 DIAGNOSIS — I959 Hypotension, unspecified: Secondary | ICD-10-CM | POA: Diagnosis present

## 2023-11-03 DIAGNOSIS — N400 Enlarged prostate without lower urinary tract symptoms: Secondary | ICD-10-CM | POA: Diagnosis present

## 2023-11-03 DIAGNOSIS — Z6823 Body mass index (BMI) 23.0-23.9, adult: Secondary | ICD-10-CM

## 2023-11-03 DIAGNOSIS — F1090 Alcohol use, unspecified, uncomplicated: Secondary | ICD-10-CM | POA: Diagnosis present

## 2023-11-03 DIAGNOSIS — F17210 Nicotine dependence, cigarettes, uncomplicated: Secondary | ICD-10-CM | POA: Diagnosis present

## 2023-11-03 DIAGNOSIS — Z888 Allergy status to other drugs, medicaments and biological substances status: Secondary | ICD-10-CM

## 2023-11-03 DIAGNOSIS — E1122 Type 2 diabetes mellitus with diabetic chronic kidney disease: Secondary | ICD-10-CM | POA: Diagnosis present

## 2023-11-03 DIAGNOSIS — I5022 Chronic systolic (congestive) heart failure: Secondary | ICD-10-CM | POA: Diagnosis present

## 2023-11-03 DIAGNOSIS — Z79899 Other long term (current) drug therapy: Secondary | ICD-10-CM

## 2023-11-03 DIAGNOSIS — E1140 Type 2 diabetes mellitus with diabetic neuropathy, unspecified: Secondary | ICD-10-CM | POA: Diagnosis present

## 2023-11-03 DIAGNOSIS — I130 Hypertensive heart and chronic kidney disease with heart failure and stage 1 through stage 4 chronic kidney disease, or unspecified chronic kidney disease: Secondary | ICD-10-CM | POA: Diagnosis present

## 2023-11-03 DIAGNOSIS — A419 Sepsis, unspecified organism: Secondary | ICD-10-CM

## 2023-11-03 DIAGNOSIS — I252 Old myocardial infarction: Secondary | ICD-10-CM

## 2023-11-03 DIAGNOSIS — Z91148 Patient's other noncompliance with medication regimen for other reason: Secondary | ICD-10-CM

## 2023-11-03 DIAGNOSIS — K922 Gastrointestinal hemorrhage, unspecified: Secondary | ICD-10-CM | POA: Diagnosis present

## 2023-11-03 DIAGNOSIS — E876 Hypokalemia: Secondary | ICD-10-CM | POA: Diagnosis not present

## 2023-11-03 DIAGNOSIS — B951 Streptococcus, group B, as the cause of diseases classified elsewhere: Secondary | ICD-10-CM | POA: Diagnosis present

## 2023-11-03 DIAGNOSIS — Z9079 Acquired absence of other genital organ(s): Secondary | ICD-10-CM

## 2023-11-03 DIAGNOSIS — Z96652 Presence of left artificial knee joint: Secondary | ICD-10-CM | POA: Diagnosis present

## 2023-11-03 DIAGNOSIS — J9601 Acute respiratory failure with hypoxia: Secondary | ICD-10-CM | POA: Diagnosis present

## 2023-11-03 DIAGNOSIS — F17200 Nicotine dependence, unspecified, uncomplicated: Secondary | ICD-10-CM

## 2023-11-03 DIAGNOSIS — E440 Moderate protein-calorie malnutrition: Secondary | ICD-10-CM | POA: Diagnosis present

## 2023-11-03 DIAGNOSIS — I480 Paroxysmal atrial fibrillation: Secondary | ICD-10-CM | POA: Diagnosis present

## 2023-11-03 DIAGNOSIS — R6520 Severe sepsis without septic shock: Secondary | ICD-10-CM

## 2023-11-03 DIAGNOSIS — N182 Chronic kidney disease, stage 2 (mild): Secondary | ICD-10-CM | POA: Diagnosis present

## 2023-11-03 DIAGNOSIS — I420 Dilated cardiomyopathy: Secondary | ICD-10-CM | POA: Diagnosis present

## 2023-11-03 DIAGNOSIS — Z7901 Long term (current) use of anticoagulants: Secondary | ICD-10-CM

## 2023-11-03 LAB — POC OCCULT BLOOD, ED: Fecal Occult Bld: POSITIVE — AB

## 2023-11-03 NOTE — ED Triage Notes (Signed)
 Pt came in via EMS w/ c/o of rectal bleeding. Pt reports drinking a fifth of vodka a day. Pt is noncompliant with home meds. Pt denies any pain at this. Normotensive during triage. A&Ox4.

## 2023-11-04 DIAGNOSIS — Z91148 Patient's other noncompliance with medication regimen for other reason: Secondary | ICD-10-CM

## 2023-11-04 DIAGNOSIS — K922 Gastrointestinal hemorrhage, unspecified: Secondary | ICD-10-CM | POA: Diagnosis present

## 2023-11-04 DIAGNOSIS — I482 Chronic atrial fibrillation, unspecified: Secondary | ICD-10-CM

## 2023-11-04 DIAGNOSIS — F1090 Alcohol use, unspecified, uncomplicated: Secondary | ICD-10-CM

## 2023-11-04 DIAGNOSIS — I5022 Chronic systolic (congestive) heart failure: Secondary | ICD-10-CM

## 2023-11-04 DIAGNOSIS — E876 Hypokalemia: Secondary | ICD-10-CM

## 2023-11-04 DIAGNOSIS — N400 Enlarged prostate without lower urinary tract symptoms: Secondary | ICD-10-CM

## 2023-11-04 DIAGNOSIS — E1121 Type 2 diabetes mellitus with diabetic nephropathy: Secondary | ICD-10-CM | POA: Diagnosis present

## 2023-11-04 LAB — CBC WITH DIFFERENTIAL/PLATELET
Abs Immature Granulocytes: 0.07 10*3/uL (ref 0.00–0.07)
Abs Immature Granulocytes: 0.08 10*3/uL — ABNORMAL HIGH (ref 0.00–0.07)
Basophils Absolute: 0 10*3/uL (ref 0.0–0.1)
Basophils Absolute: 0 10*3/uL (ref 0.0–0.1)
Basophils Relative: 0 %
Basophils Relative: 0 %
Eosinophils Absolute: 0 10*3/uL (ref 0.0–0.5)
Eosinophils Absolute: 0 10*3/uL (ref 0.0–0.5)
Eosinophils Relative: 0 %
Eosinophils Relative: 0 %
HCT: 22.9 % — ABNORMAL LOW (ref 39.0–52.0)
HCT: 24.3 % — ABNORMAL LOW (ref 39.0–52.0)
Hemoglobin: 8.2 g/dL — ABNORMAL LOW (ref 13.0–17.0)
Hemoglobin: 8.3 g/dL — ABNORMAL LOW (ref 13.0–17.0)
Immature Granulocytes: 1 %
Immature Granulocytes: 1 %
Lymphocytes Relative: 14 %
Lymphocytes Relative: 9 %
Lymphs Abs: 1 10*3/uL (ref 0.7–4.0)
Lymphs Abs: 1.4 10*3/uL (ref 0.7–4.0)
MCH: 35.3 pg — ABNORMAL HIGH (ref 26.0–34.0)
MCH: 36.8 pg — ABNORMAL HIGH (ref 26.0–34.0)
MCHC: 34.2 g/dL (ref 30.0–36.0)
MCHC: 35.8 g/dL (ref 30.0–36.0)
MCV: 102.7 fL — ABNORMAL HIGH (ref 80.0–100.0)
MCV: 103.4 fL — ABNORMAL HIGH (ref 80.0–100.0)
Monocytes Absolute: 0.4 10*3/uL (ref 0.1–1.0)
Monocytes Absolute: 0.4 10*3/uL (ref 0.1–1.0)
Monocytes Relative: 4 %
Monocytes Relative: 4 %
Neutro Abs: 8.3 10*3/uL — ABNORMAL HIGH (ref 1.7–7.7)
Neutro Abs: 9.1 10*3/uL — ABNORMAL HIGH (ref 1.7–7.7)
Neutrophils Relative %: 81 %
Neutrophils Relative %: 86 %
Platelets: 195 10*3/uL (ref 150–400)
Platelets: 198 10*3/uL (ref 150–400)
RBC: 2.23 MIL/uL — ABNORMAL LOW (ref 4.22–5.81)
RBC: 2.35 MIL/uL — ABNORMAL LOW (ref 4.22–5.81)
RDW: 14.6 % (ref 11.5–15.5)
RDW: 14.8 % (ref 11.5–15.5)
WBC: 10.3 10*3/uL (ref 4.0–10.5)
WBC: 10.6 10*3/uL — ABNORMAL HIGH (ref 4.0–10.5)
nRBC: 0.7 % — ABNORMAL HIGH (ref 0.0–0.2)
nRBC: 0.7 % — ABNORMAL HIGH (ref 0.0–0.2)

## 2023-11-04 LAB — COMPREHENSIVE METABOLIC PANEL WITH GFR
ALT: 26 U/L (ref 0–44)
ALT: 29 U/L (ref 0–44)
AST: 74 U/L — ABNORMAL HIGH (ref 15–41)
AST: 77 U/L — ABNORMAL HIGH (ref 15–41)
Albumin: 3.1 g/dL — ABNORMAL LOW (ref 3.5–5.0)
Albumin: 3.4 g/dL — ABNORMAL LOW (ref 3.5–5.0)
Alkaline Phosphatase: 65 U/L (ref 38–126)
Alkaline Phosphatase: 67 U/L (ref 38–126)
Anion gap: 15 (ref 5–15)
Anion gap: 20 — ABNORMAL HIGH (ref 5–15)
BUN: 15 mg/dL (ref 8–23)
BUN: 15 mg/dL (ref 8–23)
CO2: 19 mmol/L — ABNORMAL LOW (ref 22–32)
CO2: 24 mmol/L (ref 22–32)
Calcium: 8.5 mg/dL — ABNORMAL LOW (ref 8.9–10.3)
Calcium: 8.8 mg/dL — ABNORMAL LOW (ref 8.9–10.3)
Chloride: 97 mmol/L — ABNORMAL LOW (ref 98–111)
Chloride: 98 mmol/L (ref 98–111)
Creatinine, Ser: 0.88 mg/dL (ref 0.61–1.24)
Creatinine, Ser: 0.95 mg/dL (ref 0.61–1.24)
GFR, Estimated: >60 mL/min (ref >60)
GFR, Estimated: >60 mL/min (ref >60)
Glucose, Bld: 137 mg/dL — ABNORMAL HIGH (ref 70–99)
Glucose, Bld: 149 mg/dL — ABNORMAL HIGH (ref 70–99)
Potassium: 3 mmol/L — ABNORMAL LOW (ref 3.5–5.1)
Potassium: 3.2 mmol/L — ABNORMAL LOW (ref 3.5–5.1)
Sodium: 136 mmol/L (ref 135–145)
Sodium: 137 mmol/L (ref 135–145)
Total Bilirubin: 1.3 mg/dL — ABNORMAL HIGH (ref 0.0–1.2)
Total Bilirubin: 1.4 mg/dL — ABNORMAL HIGH (ref 0.0–1.2)
Total Protein: 6.3 g/dL — ABNORMAL LOW (ref 6.5–8.1)
Total Protein: 6.8 g/dL (ref 6.5–8.1)

## 2023-11-04 LAB — CBC
HCT: 31.8 % — ABNORMAL LOW (ref 39.0–52.0)
HCT: 33.8 % — ABNORMAL LOW (ref 39.0–52.0)
Hemoglobin: 10.9 g/dL — ABNORMAL LOW (ref 13.0–17.0)
Hemoglobin: 11 g/dL — ABNORMAL LOW (ref 13.0–17.0)
MCH: 33 pg (ref 26.0–34.0)
MCH: 32.8 pg (ref 26.0–34.0)
MCHC: 34.3 g/dL (ref 30.0–36.0)
MCHC: 32.5 g/dL (ref 30.0–36.0)
MCV: 96.4 fL (ref 80.0–100.0)
MCV: 100.9 fL — ABNORMAL HIGH (ref 80.0–100.0)
Platelets: 162 10*3/uL (ref 150–400)
Platelets: 167 10*3/uL (ref 150–400)
RBC: 3.3 MIL/uL — ABNORMAL LOW (ref 4.22–5.81)
RBC: 3.35 MIL/uL — ABNORMAL LOW (ref 4.22–5.81)
RDW: 19.6 % — ABNORMAL HIGH (ref 11.5–15.5)
RDW: 20.1 % — ABNORMAL HIGH (ref 11.5–15.5)
WBC: 10.8 10*3/uL — ABNORMAL HIGH (ref 4.0–10.5)
WBC: 9.4 10*3/uL (ref 4.0–10.5)
nRBC: 1.1 % — ABNORMAL HIGH (ref 0.0–0.2)
nRBC: 0.9 % — ABNORMAL HIGH (ref 0.0–0.2)

## 2023-11-04 LAB — PROTIME-INR
INR: 1 (ref 0.8–1.2)
Prothrombin Time: 13.4 s (ref 11.4–15.2)

## 2023-11-04 LAB — MRSA NEXT GEN BY PCR, NASAL
MRSA by PCR Next Gen: NOT DETECTED
MRSA by PCR Next Gen: NOT DETECTED

## 2023-11-04 LAB — FOLATE: Folate: 2.9 ng/mL — ABNORMAL LOW (ref >5.9)

## 2023-11-04 LAB — MAGNESIUM: Magnesium: 1.8 mg/dL (ref 1.7–2.4)

## 2023-11-04 LAB — GLUCOSE, CAPILLARY
Glucose-Capillary: 122 mg/dL — ABNORMAL HIGH (ref 70–99)
Glucose-Capillary: 135 mg/dL — ABNORMAL HIGH (ref 70–99)

## 2023-11-04 LAB — IRON AND TIBC
Iron: 136 ug/dL (ref 45–182)
Saturation Ratios: 55 % — ABNORMAL HIGH (ref 17.9–39.5)
TIBC: 248 ug/dL — ABNORMAL LOW (ref 250–450)
UIBC: 112 ug/dL

## 2023-11-04 LAB — HEMOGLOBIN A1C
Hgb A1c MFr Bld: 5.8 % — ABNORMAL HIGH (ref 4.8–5.6)
Mean Plasma Glucose: 119.76 mg/dL

## 2023-11-04 LAB — C DIFFICILE QUICK SCREEN W PCR REFLEX
C Diff antigen: POSITIVE — AB
C Diff toxin: NEGATIVE

## 2023-11-04 LAB — FERRITIN: Ferritin: 1041 ng/mL — ABNORMAL HIGH (ref 24–336)

## 2023-11-04 LAB — ABO/RH: ABO/RH(D): A POS

## 2023-11-04 LAB — VITAMIN B12: Vitamin B-12: 320 pg/mL (ref 180–914)

## 2023-11-04 LAB — ETHANOL: Alcohol, Ethyl (B): 67 mg/dL — ABNORMAL HIGH (ref <15)

## 2023-11-04 LAB — PREPARE RBC (CROSSMATCH)

## 2023-11-04 MED ORDER — MAGNESIUM SULFATE 2 GM/50ML IV SOLN
2.0000 g | Freq: Once | INTRAVENOUS | Status: AC
Start: 2023-11-04 — End: 2023-11-04
  Administered 2023-11-04: 2 g via INTRAVENOUS
  Filled 2023-11-04: qty 50

## 2023-11-04 MED ORDER — THIAMINE HCL 100 MG/ML IJ SOLN
100.0000 mg | Freq: Every day | INTRAMUSCULAR | Status: DC
  Administered 2023-11-04 – 2023-11-08 (×5): 100 mg via INTRAVENOUS
  Filled 2023-11-04 (×5): qty 2

## 2023-11-04 MED ORDER — PANTOPRAZOLE SODIUM 40 MG IV SOLR
40.0000 mg | Freq: Every day | INTRAVENOUS | Status: DC
  Administered 2023-11-04 (×2): 40 mg via INTRAVENOUS
  Filled 2023-11-04 (×2): qty 10

## 2023-11-04 MED ORDER — LORAZEPAM 1 MG PO TABS
1.0000 mg | ORAL_TABLET | ORAL | Status: AC | PRN
  Administered 2023-11-04: 1 mg via ORAL
  Filled 2023-11-04: qty 1

## 2023-11-04 MED ORDER — CARVEDILOL 6.25 MG PO TABS
6.2500 mg | ORAL_TABLET | Freq: Two times a day (BID) | ORAL | Status: DC
  Administered 2023-11-04 – 2023-11-08 (×7): 6.25 mg via ORAL
  Filled 2023-11-04 (×8): qty 1

## 2023-11-04 MED ORDER — ACETAMINOPHEN 325 MG PO TABS
650.0000 mg | ORAL_TABLET | Freq: Once | ORAL | Status: AC
  Administered 2023-11-04: 650 mg via ORAL
  Filled 2023-11-04: qty 2

## 2023-11-04 MED ORDER — METOPROLOL TARTRATE 5 MG/5ML IV SOLN: 2.5000 mg | Freq: Three times a day (TID) | INTRAVENOUS | Status: DC

## 2023-11-04 MED ORDER — NICOTINE 21 MG/24HR TD PT24
21.0000 mg | MEDICATED_PATCH | Freq: Every day | TRANSDERMAL | Status: DC
  Administered 2023-11-04 – 2023-11-07 (×4): 21 mg via TRANSDERMAL
  Filled 2023-11-04 (×5): qty 1

## 2023-11-04 MED ORDER — INSULIN ASPART 100 UNIT/ML IJ SOLN
0.0000 [IU] | Freq: Three times a day (TID) | INTRAMUSCULAR | Status: DC
  Administered 2023-11-04 (×2): 1 [IU] via SUBCUTANEOUS
  Administered 2023-11-06: 2 [IU] via SUBCUTANEOUS
  Administered 2023-11-06: 1 [IU] via SUBCUTANEOUS
  Administered 2023-11-07: 2 [IU] via SUBCUTANEOUS
  Administered 2023-11-07: 1 [IU] via SUBCUTANEOUS
  Administered 2023-11-08: 3 [IU] via SUBCUTANEOUS
  Administered 2023-11-08: 1 [IU] via SUBCUTANEOUS

## 2023-11-04 MED ORDER — ONDANSETRON HCL 4 MG/2ML IJ SOLN: 4.0000 mg | Freq: Four times a day (QID) | INTRAMUSCULAR | Status: DC | PRN

## 2023-11-04 MED ORDER — ACETAMINOPHEN 325 MG PO TABS: 650.0000 mg | ORAL_TABLET | Freq: Four times a day (QID) | ORAL | Status: DC | PRN

## 2023-11-04 MED ORDER — ACETAMINOPHEN 650 MG RE SUPP: 650.0000 mg | Freq: Four times a day (QID) | RECTAL | Status: DC | PRN

## 2023-11-04 MED ORDER — TAMSULOSIN HCL 0.4 MG PO CAPS
0.4000 mg | ORAL_CAPSULE | Freq: Every day | ORAL | Status: DC
  Administered 2023-11-04 – 2023-11-07 (×4): 0.4 mg via ORAL
  Filled 2023-11-04 (×4): qty 1

## 2023-11-04 MED ORDER — FOLIC ACID 5 MG/ML IJ SOLN
1.0000 mg | Freq: Every day | INTRAMUSCULAR | Status: DC
Start: 2023-11-04 — End: 2023-11-08
  Administered 2023-11-04 – 2023-11-07 (×4): 1 mg via INTRAVENOUS
  Filled 2023-11-04 (×7): qty 0.2

## 2023-11-04 MED ORDER — SODIUM CHLORIDE 0.9 % IV BOLUS
500.0000 mL | Freq: Once | INTRAVENOUS | Status: AC
  Administered 2023-11-04: 500 mL via INTRAVENOUS

## 2023-11-04 MED ORDER — SIMVASTATIN 20 MG PO TABS
20.0000 mg | ORAL_TABLET | Freq: Every day | ORAL | Status: DC
  Administered 2023-11-04 – 2023-11-07 (×4): 20 mg via ORAL
  Filled 2023-11-04 (×2): qty 2
  Filled 2023-11-04: qty 1
  Filled 2023-11-04: qty 2

## 2023-11-04 MED ORDER — ORAL CARE MOUTH RINSE: 15.0000 mL | OROMUCOSAL | Status: DC | PRN

## 2023-11-04 MED ORDER — CHLORHEXIDINE GLUCONATE CLOTH 2 % EX PADS
6.0000 | MEDICATED_PAD | Freq: Every day | CUTANEOUS | Status: DC
  Administered 2023-11-04 – 2023-11-05 (×2): 6 via TOPICAL

## 2023-11-04 MED ORDER — SODIUM CHLORIDE 0.9% IV SOLUTION: Freq: Once | INTRAVENOUS | Status: AC

## 2023-11-04 MED ORDER — ADULT MULTIVITAMIN W/MINERALS CH
1.0000 | ORAL_TABLET | Freq: Every day | ORAL | Status: DC
  Administered 2023-11-04 – 2023-11-08 (×5): 1 via ORAL
  Filled 2023-11-04 (×5): qty 1

## 2023-11-04 MED ORDER — LORAZEPAM 2 MG/ML IJ SOLN: 1.0000 mg | INTRAMUSCULAR | Status: AC | PRN

## 2023-11-04 MED ORDER — POTASSIUM CHLORIDE 10 MEQ/100ML IV SOLN
10.0000 meq | INTRAVENOUS | Status: AC
  Administered 2023-11-04 (×3): 10 meq via INTRAVENOUS
  Filled 2023-11-04 (×3): qty 100

## 2023-11-04 NOTE — Progress Notes (Addendum)
  Carryover admission to the Day Admitter.  I discussed this case with the EDP, Dr. Palumbo.  Per these discussions:   This is a 73 year old male with history of chronic alcohol abuse, chronic atrial fibrillation, although the patient reports that it has been "awhile" since he has taken his Eliquis , who is being admitted with suspected acute lower gastrointestinal bleed after presenting with an episode of maroon-colored stool earlier today.  Not associate with any abdominal discomfort, nausea, vomiting, hematemesis.  DRE performed by EDP revealed grossly bloody stool.  He is not on any additional blood thinners.  He is a [place] patient and his baseline hemoglobin level is not entirely clear.  In 2023 there is a hemoglobin level of 9.1 from [facility], while last month, his hemoglobin was 12.2.  This evening, his presenting hemoglobin is around 8.  INR 1.0.  He also has hypokalemia with potassium level 3.0 as well as a low magnesium  level.  Patient refuses to convey when he consumed alcohol most recently.  Blood pressures notable for 90s over 60s with maps greater than 65, heart rate in the low 100s.   INR 1.0.  He received a 500 cc normal saline bolus and EDP has placed orders for transfusion of 2 units PRBC he has also received 30 mill equivalents of IV potassium chloride  as well as 2 g of IV magnesium  sulfate. Qdaily thiamine  added.  Per my discussions with EDP, he does not appear to be overtly withdrawing from alcohol at this time.  EDP has contacted on-call [facility], Dr. Honey Lusty, requesting consult.   I have placed an order for observation to PCU for further evaluation management of the above.  I have placed some additional preliminary admit orders via the adult multi-morbid admission order set. I have also ordered n.p.o., every 4 hour H&H's through 1300 on 11/04/2023.  Have ordered Protonix  40 mg IV daily as well as the following add on studies to pretransfusion specimen: Iron studies, B12 level,  folic acid  level.  Of ordered morning labs in the form of CMP, CBC, magnesium  level, and have added-on an etoh level.     Camelia Cavalier, DO Hospitalist

## 2023-11-04 NOTE — Progress Notes (Signed)
 Patient has had 3 type 7 stools, which have a mucous appearance. MD notified with orders received to rule out cdiff.

## 2023-11-04 NOTE — Assessment & Plan Note (Signed)
-  will replete electrolytes and follow trend -continue telemetry monitoring -goals is for potassium above 4

## 2023-11-04 NOTE — Assessment & Plan Note (Signed)
-  update A1C -currently not compliant with meds -SSI has been ordered -modified carbohydrates diet discussed with patient.

## 2023-11-04 NOTE — Assessment & Plan Note (Signed)
-  last echo over 3 years ago with EF 40-45% -will update echo -medication compliance discussed with patient -low sodium diet advised -will follow strict I's and O's and daily weights -base on echo results and VS stability resume GDMT management at discharge. -outpatient follow up with cardiology service encouraged.

## 2023-11-04 NOTE — Assessment & Plan Note (Signed)
-  appears to be diverticular in nature - Continue to follow hemoglobin trend and transfuse as needed - For the most part hemodynamically stable - GI service has been consulted and will follow any further recommendations -continue supportive care -will keep NPO for now. -given prior hx of gastritis we will also continue PPI.

## 2023-11-04 NOTE — Assessment & Plan Note (Signed)
-  associated with alcohol abuse most likely -will replete electrolytes and follow trend.

## 2023-11-04 NOTE — ED Provider Notes (Signed)
 [place] EMERGENCY DEPARTMENT AT [facility] Provider Note   CSN: [id number] Arrival date & time: 11/03/23  2254     History  Chief Complaint  Patient presents with   Rectal Bleeding   Hypotension    Corey Meyer is a 73 y.o. male.  The history is provided by the patient.  Rectal Bleeding Quality:  Maroon Amount:  Copious Duration:  1 day Timing:  Constant Chronicity:  New Context: spontaneously   Relieved by:  Nothing Worsened by:  Nothing Ineffective treatments:  None tried Associated symptoms: no abdominal pain   Risk factors: liver disease   Patient with HCV presents with maroon stool.  Is supposed to be on a DOAC but is not taking one.      Past Medical History:  Diagnosis Date   Alcoholism (HCC)    Anemia    Benign prostate hyperplasia    Controlled diabetes mellitus type II without complication (HCC)    Essential hypertension    Hepatitis C    Hypercholesteremia    Myocardial infarction [facility])      Home Medications Prior to Admission medications   Medication Sig Start Date End Date Taking? Authorizing Provider  apixaban  (ELIQUIS ) 5 MG TABS tablet Take 5 mg by mouth 2 (two) times daily. Patient not taking: Reported on 09/17/2023 11/26/22   [provider]  carvedilol  (COREG ) 6.25 MG tablet Take 1 tablet (6.25 mg total) by mouth 2 (two) times daily. Patient not taking: Reported on 09/17/2023 01/04/22 01/04/23  Burton Casey, MD  folic acid  (FOLVITE ) 1 MG tablet Take 1 mg by mouth daily. Patient not taking: Reported on 09/17/2023    [provider]  glipiZIDE  (GLUCOTROL ) 5 MG tablet Take 1 tablet (5 mg total) by mouth daily before breakfast. Patient not taking: Reported on 09/17/2023 03/11/22   Ephriam Hashimoto, MD  metFORMIN  (GLUCOPHAGE ) 1000 MG tablet Take 1,000 mg by mouth 2 (two) times daily with a meal. Patient not taking: Reported on 09/17/2023    [provider]  montelukast  (SINGULAIR ) 10 MG tablet Take 10  mg by mouth at bedtime. Patient not taking: Reported on 09/17/2023    [provider]  omeprazole (PRILOSEC) 20 MG capsule Take 20 mg by mouth daily as needed (indigestion). Patient not taking: Reported on 09/17/2023    [provider]  pantoprazole  (PROTONIX ) 20 MG tablet Take 1 tablet (20 mg total) by mouth daily for 5 days. Patient not taking: Reported on 09/17/2023 12/30/22 01/04/23  Gowens, Mariah L, PA-C  potassium chloride  SA (KLOR-CON  M) 20 MEQ tablet Take 1 tablet (20 mEq total) by mouth daily. Patient not taking: Reported on 09/17/2023 01/04/22   Burton Casey, MD  sacubitril-valsartan (ENTRESTO) 49-51 MG Take 1 tablet by mouth 2 (two) times daily. Patient not taking: Reported on 09/17/2023 10/18/22   [provider]  simvastatin  (ZOCOR ) 20 MG tablet Take 20 mg by mouth daily at 6 PM. Patient not taking: Reported on 09/17/2023 11/26/22   [provider]  tamsulosin  (FLOMAX ) 0.4 MG CAPS capsule Take 0.4 mg by mouth at bedtime. Patient not taking: Reported on 09/17/2023    [provider]  potassium chloride  20 MEQ TBCR Take 20 mEq by mouth daily. Patient not taking: Reported on 12/31/2021 01/06/19 01/04/22  Knox Perl, MD      Allergies    Haldol [haloperidol lactate]    Review of Systems   Review of Systems  Gastrointestinal:  Positive for anal bleeding and hematochezia. Negative  for abdominal pain.  All other systems reviewed and are negative.   Physical Exam Updated Vital Signs BP 93/66   Pulse (!) 108   Temp 97.9 F (36.6 C) (Oral)   Resp 18   SpO2 100%  Physical Exam Vitals and nursing note reviewed. Exam conducted with a chaperone present.  Constitutional:      General: He is not in acute distress.    Appearance: He is well-developed. He is not diaphoretic.  HENT:     Head: Normocephalic and atraumatic.     Nose: Nose normal.  Eyes:     Conjunctiva/sclera: Conjunctivae normal.     Pupils: Pupils are equal, round, and reactive to  light.  Cardiovascular:     Rate and Rhythm: Normal rate and regular rhythm.  Pulmonary:     Effort: Pulmonary effort is normal.     Breath sounds: Normal breath sounds. No wheezing or rales.  Abdominal:     General: Bowel sounds are normal.     Palpations: Abdomen is soft.     Tenderness: There is no abdominal tenderness. There is no guarding or rebound.  Genitourinary:    Rectum: Guaiac result positive.  Musculoskeletal:        General: Normal range of motion.     Cervical back: Normal range of motion and neck supple.  Skin:    General: Skin is warm and dry.     Capillary Refill: Capillary refill takes less than 2 seconds.  Neurological:     General: No focal deficit present.     Mental Status: He is alert and oriented to person, place, and time.     Deep Tendon Reflexes: Reflexes normal.  Psychiatric:        Mood and Affect: Mood normal.     ED Results / Procedures / Treatments   Labs (all labs ordered are listed, but only abnormal results are displayed) Results for orders placed or performed during the hospital encounter of 11/03/23  POC occult blood, ED Provider will collect   Collection Time: 11/03/23 11:40 PM  Result Value Ref Range   Fecal Occult Bld POSITIVE (A) NEGATIVE  Type and screen [facility]   Collection Time: 11/04/23 12:02 AM  Result Value Ref Range   ABO/RH(D) PENDING    Antibody Screen PENDING    Sample Expiration      11/07/2023,2359 Performed at [facility], [address], [place], [place] [address]   CBC with Differential   Collection Time: 11/04/23 12:03 AM  Result Value Ref Range   WBC 10.3 4.0 - 10.5 K/uL   RBC 2.23 (L) 4.22 - 5.81 MIL/uL   Hemoglobin 8.2 (L) 13.0 - 17.0 g/dL   HCT 60.4 (L) 54.0 - 98.1 %   MCV 102.7 (H) 80.0 - 100.0 fL   MCH 36.8 (H) 26.0 - 34.0 pg   MCHC 35.8 30.0 - 36.0 g/dL   RDW 19.1 47.8 - 29.5 %   Platelets 195 150 - 400 K/uL   nRBC 0.7 (H) 0.0 - 0.2 %   Neutrophils  Relative % 81 %   Neutro Abs 8.3 (H) 1.7 - 7.7 K/uL   Lymphocytes Relative 14 %   Lymphs Abs 1.4 0.7 - 4.0 K/uL   Monocytes Relative 4 %   Monocytes Absolute 0.4 0.1 - 1.0 K/uL   Eosinophils Relative 0 %   Eosinophils Absolute 0.0 0.0 - 0.5 K/uL   Basophils Relative 0 %   Basophils Absolute 0.0 0.0 -  0.1 K/uL   Immature Granulocytes 1 %   Abs Immature Granulocytes 0.08 (H) 0.00 - 0.07 K/uL  Comprehensive metabolic panel   Collection Time: 11/04/23 12:03 AM  Result Value Ref Range   Sodium 136 135 - 145 mmol/L   Potassium 3.0 (L) 3.5 - 5.1 mmol/L   Chloride 97 (L) 98 - 111 mmol/L   CO2 24 22 - 32 mmol/L   Glucose, Bld 137 (H) 70 - 99 mg/dL   BUN 15 8 - 23 mg/dL   Creatinine, Ser 0.98 0.61 - 1.24 mg/dL   Calcium  8.5 (L) 8.9 - 10.3 mg/dL   Total Protein 6.3 (L) 6.5 - 8.1 g/dL   Albumin 3.1 (L) 3.5 - 5.0 g/dL   AST 77 (H) 15 - 41 U/L   ALT 26 0 - 44 U/L   Alkaline Phosphatase 65 38 - 126 U/L   Total Bilirubin 1.4 (H) 0.0 - 1.2 mg/dL   GFR, Estimated >11 >91 mL/min   Anion gap 15 5 - 15  Protime-INR   Collection Time: 11/04/23 12:03 AM  Result Value Ref Range   Prothrombin Time 13.4 11.4 - 15.2 seconds   INR 1.0 0.8 - 1.2   No results found.  EKG None  Radiology No results found.  Procedures .Critical Care  Performed by: Tonya Fredrickson, MD Authorized by: Tonya Fredrickson, MD   Critical care provider statement:    Critical care time (minutes):  30   Critical care end time:  11/04/2023 1:24 AM   Critical care was time spent personally by me on the following activities:  Development of treatment plan with patient or surrogate, discussions with consultants, evaluation of patient's response to treatment, examination of patient, ordering and review of laboratory studies, ordering and review of radiographic studies, ordering and performing treatments and interventions, pulse oximetry, re-evaluation of patient's condition and review of old charts   I assumed direction of critical  care for this patient from another provider in my specialty: no     Care discussed with: admitting provider   Comments:     Blood administration      Medications Ordered in ED Medications  magnesium  sulfate IVPB 2 g 50 mL (has no administration in time range)  potassium chloride  10 mEq in 100 mL IVPB (has no administration in time range)  thiamine  (VITAMIN B1) injection 100 mg (has no administration in time range)  sodium chloride  0.9 % bolus 500 mL (has no administration in time range)    ED Course/ Medical Decision Making/ A&P                                 Medical Decision Making Patient with anal bleeding   Amount and/or Complexity of Data Reviewed Independent Historian: EMS    Details: See above  External Data Reviewed: labs.    Details: Reviewed  Labs: ordered.    Details: Sodium normal 136, potassium low 3, normal creatinine.  Elevated AST77 elevated bili 1.4. Normal white count 10.3, low hemoglobin 8.2, normal platelets   Risk Prescription drug management. Decision regarding hospitalization.    Final Clinical Impression(s) / ED Diagnoses Final diagnoses:  Hypokalemia  Gastrointestinal hemorrhage, unspecified gastrointestinal hemorrhage type   The patient appears reasonably stabilized for admission considering the current resources, flow, and capabilities available in the ED at this time, and I doubt any other [facility] requiring further screening and/or treatment in the ED prior to admission.  Rx / DC Orders ED Discharge Orders     None         Diandre Merica, MD 11/04/23 1610

## 2023-11-04 NOTE — Assessment & Plan Note (Signed)
-  non-compliant with eliquis  or rate control agents -appears paroxysmal in nature -will closely monitor on telemetry, replete electrolytes and resume adjusted dose of b-blockers as needed.

## 2023-11-04 NOTE — Assessment & Plan Note (Signed)
-  resume flomax  (one of the few meds that he takes the most) -not retention complaints currently reported.

## 2023-11-04 NOTE — H&P (Signed)
 [facility] History and Physical  Corey Meyer ONG:[id number] DOB: 1951/04/11 DOA: 11/03/2023  Referring physician: Dr. Maralee Senate  PCP: [facility]   Chief Complaint: bloody stool and soft BP  HPI: Corey Meyer is a 73 y.o. male with past medical history significant for type 2 diabetes with nephropathy, hypertension, hyperlipidemia, BPH, tobacco abuse, alcohol abuse/recreational drug use, history of paroxysmal atrial fibrillation, chronic systolic heart failure and medication noncompliance; who presented to the hospital secondary to bloody stools and soft blood pressure.  Per patient reports symptom has been present for the last 24 hours prior to admission, started suddenly and and has been persistent.    Patient expressed no associated nausea, vomiting, hematemesis, abdominal pain, dysuria/hematuria, chest pain, shortness of breath or focal weaknesses.  Patient reported medication noncompliance and has been a while since the last time he took his prescribed Eliquis .  In the ED DRE performed by EDP demonstrated positive/bloody results, Hgb down to 8 range (from above 9.5-10 at baseline, per care everywhere records).  Gentle fluid resuscitation and 2 units PRBCs has been ordered; GI service consulted and [facility] contacted to bring patient in the hospital for further evaluation and management.   Review of Systems:  Constitutional:  No weight loss, night sweats, Fevers, chills, fatigue.  HEENT:  No headaches, Difficulty swallowing,Tooth/dental problems,Sore throat,  No sneezing, itching, ear ache, nasal congestion, post nasal drip,  Cardio-vascular:  No chest pain, Orthopnea, PND, swelling in lower extremities, anasarca, dizziness, palpitations  GI:  No heartburn, indigestion, abdominal pain, nausea, vomiting, diarrhea, change in bowel habits, loss of appetite  Resp:  No shortness of breath with exertion or at rest. No excess mucus, no productive cough, No non-productive  cough, No coughing up of blood.No change in color of mucus.No wheezing.No chest wall deformity  Skin:  no rash or lesions.  GU:  no dysuria, change in color of urine, no urgency or frequency. No flank pain.  Musculoskeletal:  No joint pain or swelling. No decreased range of motion. No back pain.  Psych:  No change in mood or affect. No depression or anxiety. No memory loss.   Past Medical History:  Diagnosis Date   Alcoholism (HCC)    Anemia    Benign prostate hyperplasia    Controlled diabetes mellitus type II without complication (HCC)    Essential hypertension    Hepatitis C    Hypercholesteremia    Myocardial infarction [facility])    Past Surgical History:  Procedure Laterality Date   HEMORRHOID SURGERY     HERNIA REPAIR     KNEE ARTHROPLASTY Left    LEFT HEART CATH AND CORONARY ANGIOGRAPHY N/A 12/25/2018   Procedure: LEFT HEART CATH AND CORONARY ANGIOGRAPHY;  Surgeon: Knox Perl, MD;  [facility] IN[facility];  Service: Cardiovascular;  Laterality: N/A;   TRANSURETHRAL RESECTION OF PROSTATE N/A 03/18/2022   Procedure: TRANSURETHRAL RESECTION OF THE PROSTATE (TURP), LASER LITHOLAPAXY;  Surgeon: Samson Croak, MD;  Location: [facility] ORS;  Service: Urology;  Laterality: N/A;   Social History:  reports that he has been smoking cigarettes. He has a 30 pack-year smoking history. He has never used smokeless tobacco. He reports current alcohol use of about 6.0 standard drinks of alcohol per week. He reports current drug use. Drug: Cocaine.  Allergies  Allergen Reactions   Haldol [Haloperidol Lactate] Swelling and Other (See Comments)    Xerostomia    Family hx: with hx of HTN and diabetes in his famioly; no other pertinent family history  as per patient report.  Prior to Admission medications   Medication Sig Start Date End Date Taking? Authorizing Provider  apixaban  (ELIQUIS ) 5 MG TABS tablet Take 5 mg by mouth 2 (two) times daily. Patient not taking: Reported on 09/17/2023  11/26/22   [provider]  carvedilol  (COREG ) 6.25 MG tablet Take 1 tablet (6.25 mg total) by mouth 2 (two) times daily. Patient not taking: Reported on 09/17/2023 01/04/22 01/04/23  Burton Casey, MD  folic acid  (FOLVITE ) 1 MG tablet Take 1 mg by mouth daily. Patient not taking: Reported on 09/17/2023    [provider]  glipiZIDE  (GLUCOTROL ) 5 MG tablet Take 1 tablet (5 mg total) by mouth daily before breakfast. Patient not taking: Reported on 09/17/2023 03/11/22   Ephriam Hashimoto, MD  metFORMIN  (GLUCOPHAGE ) 1000 MG tablet Take 1,000 mg by mouth 2 (two) times daily with a meal. Patient not taking: Reported on 09/17/2023    [provider]  montelukast  (SINGULAIR ) 10 MG tablet Take 10 mg by mouth at bedtime. Patient not taking: Reported on 09/17/2023    [provider]  omeprazole (PRILOSEC) 20 MG capsule Take 20 mg by mouth daily as needed (indigestion). Patient not taking: Reported on 09/17/2023    [provider]  pantoprazole  (PROTONIX ) 20 MG tablet Take 1 tablet (20 mg total) by mouth daily for 5 days. Patient not taking: Reported on 09/17/2023 12/30/22 01/04/23  Gowens, Mariah L, PA-C  potassium chloride  SA (KLOR-CON  M) 20 MEQ tablet Take 1 tablet (20 mEq total) by mouth daily. Patient not taking: Reported on 09/17/2023 01/04/22   Burton Casey, MD  sacubitril-valsartan (ENTRESTO) 49-51 MG Take 1 tablet by mouth 2 (two) times daily. Patient not taking: Reported on 09/17/2023 10/18/22   [provider]  simvastatin  (ZOCOR ) 20 MG tablet Take 20 mg by mouth daily at 6 PM. Patient not taking: Reported on 09/17/2023 11/26/22   [provider]  tamsulosin  (FLOMAX ) 0.4 MG CAPS capsule Take 0.4 mg by mouth at bedtime. Patient not taking: Reported on 09/17/2023    [provider]  potassium chloride  20 MEQ TBCR Take 20 mEq by mouth daily. Patient not taking: Reported on 12/31/2021 01/06/19 01/04/22  Knox Perl, MD   Physical  Exam: Vitals:   11/04/23 0608 11/04/23 0623 11/04/23 0700 11/04/23 0800  BP: 121/75 129/85 97/72   Pulse: (!) 116 100 (!) 114   Resp: 20 19 18    Temp: 100.1 F (37.8 C) 99.6 F (37.6 C)  99.1 F (37.3 C)  TempSrc: Oral   Oral  SpO2: (!) 85%  97%   Weight:      Height:        Wt Readings from Last 3 Encounters:  11/04/23 70.5 kg  12/30/22 92.1 kg  03/18/22 90.7 kg    General:  Appears calm and comfortable; oriented x 3 and in no acute distress.  Patient denies chest pain. Eyes: PERRL, normal lids & conjunctiva ENT: grossly normal hearing, lips & tongue Neck: no LAD, masses or thyromegaly appreciated on exam. Cardiovascular: Rate controlled; no rubs, no gallops, no JVD and no lower extremity edema. Telemetry: sinus tachycardia appreciated on telemetry. Respiratory: CTA bilaterally, no w/r/r. Normal respiratory effort. Abdomen: soft, nt/nd and with positive BS. Skin: no rash or petechiae. Musculoskeletal: grossly normal tone BUE/BLE Psychiatric: grossly normal mood and affect, speech fluent and appropriate Neurologic: grossly non-focal.          Labs on Admission:  Basic Metabolic Panel: Recent Labs  Lab 11/04/23 0003 11/04/23 0400  NA 136 137  K 3.0* 3.2*  CL 97* 98  CO2 24 19*  GLUCOSE 137* 149*  BUN 15 15  CREATININE 0.95 0.88  CALCIUM  8.5* 8.8*  MG  --  1.8   Liver Function Tests: Recent Labs  Lab 11/04/23 0003 11/04/23 0400  AST 77* 74*  ALT 26 29  ALKPHOS 65 67  BILITOT 1.4* 1.3*  PROT 6.3* 6.8  ALBUMIN 3.1* 3.4*   CBC: Recent Labs  Lab 11/04/23 0003 11/04/23 0400  WBC 10.3 10.6*  NEUTROABS 8.3* 9.1*  HGB 8.2* 8.3*  HCT 22.9* 24.3*  MCV 102.7* 103.4*  PLT 195 198   Radiological Exams on Admission: No results found.  Assessment/Plan Principal Problem:   Acute lower GI bleeding Active Problems:   Chronic systolic CHF (congestive heart failure) (HCC)   Atrial fibrillation, chronic (HCC)   Hypokalemia   Hypomagnesemia   Alcohol  use   BPH (benign prostatic hyperplasia)   Type 2 diabetes with nephropathy (HCC)   Non compliance w medication regimen  Acute lower GI bleeding -appears to be diverticular in nature - Continue to follow hemoglobin trend and transfuse as needed - For the most part hemodynamically stable - GI service has been consulted and will follow any further recommendations -continue supportive care -will keep NPO for now. -given prior hx of gastritis we will also continue PPI.   Chronic systolic CHF (congestive heart failure) (HCC) -last echo over 3 years ago with EF 40-45% -will update echo -medication compliance discussed with patient -low sodium diet advised -will follow strict I's and O's and daily weights -base on echo results and VS stability resume GDMT management at discharge. -outpatient follow up with cardiology service encouraged.  Non compliance w medication regimen - education and importance with compliance has been provided -patient will need prescriptions at discharge to help him restart regimen until follow up with PCP  Atrial fibrillation, chronic (HCC) -non-compliant with eliquis  or rate control agents -appears paroxysmal in nature -will closely monitor on telemetry, replete electrolytes and resume adjusted dose of b-blockers as needed.  Type 2 diabetes with nephropathy (HCC) -update A1C -currently not compliant with meds -SSI has been ordered -modified carbohydrates diet discussed with patient.  CKD stage 2 -in the setting of Diabetes. -maintain adequate hydration -minimize nephrotoxic agents -follow renal function trend -overall appears stable and at baseline  Alcohol use -not active withdrawal currently present -will use CIWA protocol -thiamine  and folic acid  ordered -cessation counseling provided -will check B12 level  BPH (benign prostatic hyperplasia) -resume flomax  (one of the few meds that he takes the most) -not retention complaints currently  reported.  Hypomagnesemia -associated with alcohol abuse most likely -will replete electrolytes and follow trend.  Hypokalemia -will replete electrolytes and follow trend -continue telemetry monitoring -goals is for potassium above 4  Tobacco/recreational drug use -cessation counseling provided -nicotine patch provided. -UDS ordered. -TOC has been asked to provide outpatient resources to help him quitting.    Consultations:  [facility] service has been called noted (Dr. Honey Lusty).   Code Status: Full code DVT Prophylaxis: SCDs Family Communication: No family at bedside. Disposition Plan: LOS 24-48 Hr; anticipate discharge back home.  Time spent: 75 minutes  Justina Oman [facility] Pager [phone]

## 2023-11-04 NOTE — Assessment & Plan Note (Signed)
-   education and importance with compliance has been provided -patient will need prescriptions at discharge to help him restart regimen until follow up with PCP

## 2023-11-04 NOTE — Assessment & Plan Note (Signed)
-  not active withdrawal currently present -will use CIWA protocol -thiamine  and folic acid  ordered -cessation counseling provided -will check B12 level

## 2023-11-04 NOTE — Consult Note (Signed)
 Referring Provider: Dr. Murrel Arnt Primary Care Physician:  [facility] Primary Gastroenterologist:  Para Bold  Reason for Consultation:  GI Bleed  HPI: Corey Meyer is a 73 y.o. male with acute onset of maroon-colored stools that started yesterday and unable to give me details about it other than it was maroon-colored.  Denies any associated abdominal pain, nausea, or vomiting.  Denies NSAIDs.  Drinks 1/5 of liquor per day.  On Eliquis  for atrial fibrillation.  History of crack cocaine use last 2 months ago.  He had an EGD/colonoscopy in May 2023 in [place], [place] and the EGD showed chronic gastritis and an 8 mm polyp was removed from the colon (path not available to me).  Nurse reports that he is having small amount of red with mainly brown stool. Hgb 8.2.   Past Medical History:  Diagnosis Date   Alcoholism (HCC)    Anemia    Benign prostate hyperplasia    Controlled diabetes mellitus type II without complication (HCC)    Essential hypertension    Hepatitis C    Hypercholesteremia    Myocardial infarction [facility])   Atrial fibrillation  Past Surgical History:  Procedure Laterality Date   HEMORRHOID SURGERY     HERNIA REPAIR     KNEE ARTHROPLASTY Left    LEFT HEART CATH AND CORONARY ANGIOGRAPHY N/A 12/25/2018   Procedure: LEFT HEART CATH AND CORONARY ANGIOGRAPHY;  Surgeon: Knox Perl, MD;  Location: [facility];  Service: Cardiovascular;  Laterality: N/A;   TRANSURETHRAL RESECTION OF PROSTATE N/A 03/18/2022   Procedure: TRANSURETHRAL RESECTION OF THE PROSTATE (TURP), LASER LITHOLAPAXY;  Surgeon: Samson Croak, MD;  Location: [facility];  Service: Urology;  Laterality: N/A;    Prior to Admission medications   Medication Sig Start Date End Date Taking? Authorizing Provider  apixaban  (ELIQUIS ) 5 MG TABS tablet Take 5 mg by mouth 2 (two) times daily. Patient not taking: Reported on 09/17/2023 11/26/22   [provider]  carvedilol  (COREG ) 6.25 MG tablet Take 1  tablet (6.25 mg total) by mouth 2 (two) times daily. Patient not taking: Reported on 09/17/2023 01/04/22 01/04/23  Burton Casey, MD  folic acid  (FOLVITE ) 1 MG tablet Take 1 mg by mouth daily. Patient not taking: Reported on 09/17/2023    [provider]  glipiZIDE  (GLUCOTROL ) 5 MG tablet Take 1 tablet (5 mg total) by mouth daily before breakfast. Patient not taking: Reported on 09/17/2023 03/11/22   Ephriam Hashimoto, MD  metFORMIN  (GLUCOPHAGE ) 1000 MG tablet Take 1,000 mg by mouth 2 (two) times daily with a meal. Patient not taking: Reported on 09/17/2023    [provider]  metoprolol  succinate (TOPROL -XL) 25 MG 24 hr tablet Take 0.5 tablets by mouth daily. Patient not taking: Reported on 11/04/2023 09/25/23   [provider]  montelukast  (SINGULAIR ) 10 MG tablet Take 10 mg by mouth at bedtime. Patient not taking: Reported on 09/17/2023    [provider]  omeprazole (PRILOSEC) 20 MG capsule Take 20 mg by mouth daily as needed (indigestion). Patient not taking: Reported on 09/17/2023    [provider]  pantoprazole  (PROTONIX ) 20 MG tablet Take 1 tablet (20 mg total) by mouth daily for 5 days. Patient not taking: Reported on 09/17/2023 12/30/22 01/04/23  Gowens, Mariah L, PA-C  potassium chloride  SA (KLOR-CON  M) 20 MEQ tablet Take 1 tablet (20 mEq total) by mouth daily. Patient not taking: Reported on 09/17/2023 01/04/22   Burton Casey, MD  sacubitril-valsartan [facility]) [phone]  MG Take 1 tablet by mouth 2 (two) times daily. Patient not taking: Reported on 09/17/2023 10/18/22   [provider]  simvastatin  (ZOCOR ) 20 MG tablet Take 20 mg by mouth daily at 6 PM. Patient not taking: Reported on 09/17/2023 11/26/22   [provider]  tamsulosin  (FLOMAX ) 0.4 MG CAPS capsule Take 0.4 mg by mouth at bedtime. Patient not taking: Reported on 09/17/2023    [provider]  potassium chloride  20 MEQ TBCR Take 20 mEq by mouth daily. Patient  not taking: Reported on 12/31/2021 01/06/19 01/04/22  Knox Perl, MD    Scheduled Meds:  carvedilol   6.25 mg Oral BID WC   Chlorhexidine  Gluconate Cloth  6 each Topical Daily   folic acid   1 mg Intravenous Daily   insulin  aspart  0-9 Units Subcutaneous TID WC   multivitamin with minerals  1 tablet Oral Daily   nicotine  21 mg Transdermal Daily   pantoprazole  (PROTONIX ) IV  40 mg Intravenous QHS   simvastatin   20 mg Oral q1800   tamsulosin   0.4 mg Oral QHS   thiamine  (VITAMIN B1) injection  100 mg Intravenous Daily   Continuous Infusions: PRN Meds:.acetaminophen  **OR** acetaminophen , LORazepam  **OR** LORazepam , ondansetron  (ZOFRAN ) IV, mouth rinse  Allergies as of 11/03/2023 - Review Complete 11/03/2023  Allergen Reaction Noted   Haldol [haloperidol lactate] Swelling and Other (See Comments) 01/21/2018    History reviewed. No pertinent family history.  Social History   Socioeconomic History   Marital status: Single    Spouse name: Not on file   Number of children: 3   Years of education: Not on file   Highest education level: Not on file  Occupational History   Not on file  Tobacco Use   Smoking status: Every Day    Current packs/day: 1.00    Average packs/day: 1 pack/day for 30.0 years (30.0 ttl pk-yrs)    Types: Cigarettes   Smokeless tobacco: Never  Vaping Use   Vaping status: Never Used  Substance and Sexual Activity   Alcohol use: Yes    Alcohol/week: 6.0 standard drinks of alcohol    Types: 6 Shots of liquor per week    Comment: 3.5 gallons of bourbon/week since 03/2019   Drug use: Yes    Types: Cocaine    Comment: Quit cocaine in early 2020   Sexual activity: Not Currently  Other Topics Concern   Not on file  Social History Narrative   Not on file   Social Drivers of Health   Financial Resource Strain: Not on file  Food Insecurity: No Food Insecurity (11/04/2023)   Hunger Vital Sign    Worried About Running Out of Food in the Last Year: Never true    Ran  Out of Food in the Last Year: Never true  Transportation Needs: No Transportation Needs (11/04/2023)   PRAPARE - Administrator, Civil Service (Medical): No    Lack of Transportation (Non-Medical): No  Physical Activity: Unknown (12/25/2018)   Exercise Vital Sign    Days of Exercise per Week: 0 days    Minutes of Exercise per Session: Not on file  Stress: No Stress Concern Present (12/25/2018)   [employer] of Occupational Health - Occupational Stress Questionnaire    Feeling of Stress : Not at all  Social Connections: Moderately Isolated (11/04/2023)   Social Connection and Isolation Panel [NHANES]    Frequency of Communication with Friends and Family: Once a week    Frequency of Social  Gatherings with Friends and Family: More than three times a week    Attends Religious Services: More than 4 times per year    Active Member of Clubs or Organizations: No    Attends Banker Meetings: Never    Marital Status: Never married  Intimate Partner Violence: Not At Risk (11/04/2023)   Humiliation, Afraid, Rape, and Kick questionnaire    Fear of Current or Ex-Partner: No    Emotionally Abused: No    Physically Abused: No    Sexually Abused: No    Review of Systems: All negative except as stated above in HPI.  Physical Exam: Vital signs: Vitals:   11/04/23 0951 11/04/23 1000  BP: (!) 121/90 120/80  Pulse:    Resp: 20 15  Temp: 99.4 F (37.4 C)   SpO2: 100%      General:   Chronically ill-appearing, disheveled, lethargic, thin, no acute distress  Head: normocephalic, atraumatic Eyes: anicteric sclera ENT: oropharynx clear Neck: supple, nontender Lungs:  Clear throughout to auscultation.   No wheezes, crackles, or rhonchi. No acute distress. Heart:  Regular rate and rhythm; no murmurs, clicks, rubs,  or gallops. Abdomen: Soft, nontender, nondistended, positive bowel sounds Rectal:  Deferred Ext: no edema  GI:  Lab Results: Recent Labs    11/04/23 0003  11/04/23 0400  WBC 10.3 10.6*  HGB 8.2* 8.3*  HCT 22.9* 24.3*  PLT 195 198   BMET Recent Labs    11/04/23 0003 11/04/23 0400  NA 136 137  K 3.0* 3.2*  CL 97* 98  CO2 24 19*  GLUCOSE 137* 149*  BUN 15 15  CREATININE 0.95 0.88  CALCIUM  8.5* 8.8*   LFT Recent Labs    11/04/23 0400  PROT 6.8  ALBUMIN 3.4*  AST 74*  ALT 29  ALKPHOS 67  BILITOT 1.3*   PT/INR Recent Labs    11/04/23 0003  LABPROT 13.4  INR 1.0     Studies/Results: No results found.  Impression/Plan: 73 year old with painless maroon-colored stools likely diverticular in origin.  Colonoscopy and endoscopy in 2023 as stated above.  Bleeding seems to be resolving.  Hold Eliquis . Agree with transfusion.  Full liquid diet okay.  Continue supportive care.  I do not think repeat endoscopic procedures are necessary at this time.  [facility] will follow.    LOS: 0 days   Yvetta Herbert  11/04/2023, 11:54 AM  Questions please call [phone]

## 2023-11-05 ENCOUNTER — Observation Stay (HOSPITAL_COMMUNITY)

## 2023-11-05 ENCOUNTER — Other Ambulatory Visit (HOSPITAL_COMMUNITY): Payer: Self-pay

## 2023-11-05 DIAGNOSIS — E440 Moderate protein-calorie malnutrition: Secondary | ICD-10-CM | POA: Diagnosis present

## 2023-11-05 DIAGNOSIS — R6520 Severe sepsis without septic shock: Secondary | ICD-10-CM

## 2023-11-05 DIAGNOSIS — I480 Paroxysmal atrial fibrillation: Secondary | ICD-10-CM

## 2023-11-05 DIAGNOSIS — A0472 Enterocolitis due to Clostridium difficile, not specified as recurrent: Principal | ICD-10-CM

## 2023-11-05 DIAGNOSIS — A419 Sepsis, unspecified organism: Secondary | ICD-10-CM

## 2023-11-05 DIAGNOSIS — I5020 Unspecified systolic (congestive) heart failure: Secondary | ICD-10-CM

## 2023-11-05 DIAGNOSIS — I5022 Chronic systolic (congestive) heart failure: Secondary | ICD-10-CM

## 2023-11-05 DIAGNOSIS — J9601 Acute respiratory failure with hypoxia: Secondary | ICD-10-CM

## 2023-11-05 DIAGNOSIS — K922 Gastrointestinal hemorrhage, unspecified: Secondary | ICD-10-CM

## 2023-11-05 LAB — ECHOCARDIOGRAM COMPLETE
AR max vel: 2.3 cm2
AV Area VTI: 1.75 cm2
AV Area mean vel: 2.01 cm2
AV Mean grad: 1 mmHg
AV Peak grad: 2.3 mmHg
Ao pk vel: 0.77 m/s
Area-P 1/2: 4.15 cm2
Height: 69 in
MV VTI: 1.76 cm2
S' Lateral: 5.3 cm
Single Plane A4C EF: 35.9 %
Weight: 2486.79 [oz_av]

## 2023-11-05 LAB — GLUCOSE, CAPILLARY
Glucose-Capillary: 84 mg/dL (ref 70–99)
Glucose-Capillary: 98 mg/dL (ref 70–99)
Glucose-Capillary: 117 mg/dL — ABNORMAL HIGH (ref 70–99)
Glucose-Capillary: 138 mg/dL — ABNORMAL HIGH (ref 70–99)
Glucose-Capillary: 140 mg/dL — ABNORMAL HIGH (ref 70–99)

## 2023-11-05 LAB — TYPE AND SCREEN
ABO/RH(D): A POS
Antibody Screen: NEGATIVE
Unit division: 0
Unit division: 0

## 2023-11-05 LAB — URINALYSIS, ROUTINE W REFLEX MICROSCOPIC
Bilirubin Urine: NEGATIVE
Glucose, UA: NEGATIVE mg/dL
Hgb urine dipstick: NEGATIVE
Ketones, ur: NEGATIVE mg/dL
Nitrite: NEGATIVE
Protein, ur: NEGATIVE mg/dL
Specific Gravity, Urine: 1.005 (ref 1.005–1.030)
pH: 7 (ref 5.0–8.0)

## 2023-11-05 LAB — CBC
HCT: 29.8 % — ABNORMAL LOW (ref 39.0–52.0)
HCT: 32.6 % — ABNORMAL LOW (ref 39.0–52.0)
Hemoglobin: 10.2 g/dL — ABNORMAL LOW (ref 13.0–17.0)
Hemoglobin: 11.3 g/dL — ABNORMAL LOW (ref 13.0–17.0)
MCH: 33.2 pg (ref 26.0–34.0)
MCH: 32.8 pg (ref 26.0–34.0)
MCHC: 34.2 g/dL (ref 30.0–36.0)
MCHC: 34.7 g/dL (ref 30.0–36.0)
MCV: 97.1 fL (ref 80.0–100.0)
MCV: 94.8 fL (ref 80.0–100.0)
Platelets: 157 10*3/uL (ref 150–400)
Platelets: 149 10*3/uL — ABNORMAL LOW (ref 150–400)
RBC: 3.07 MIL/uL — ABNORMAL LOW (ref 4.22–5.81)
RBC: 3.44 MIL/uL — ABNORMAL LOW (ref 4.22–5.81)
RDW: 19.7 % — ABNORMAL HIGH (ref 11.5–15.5)
RDW: 18.8 % — ABNORMAL HIGH (ref 11.5–15.5)
WBC: 7.6 10*3/uL (ref 4.0–10.5)
WBC: 6.4 10*3/uL (ref 4.0–10.5)
nRBC: 0.8 % — ABNORMAL HIGH (ref 0.0–0.2)
nRBC: 1.4 % — ABNORMAL HIGH (ref 0.0–0.2)

## 2023-11-05 LAB — BLOOD GAS, ARTERIAL
Acid-Base Excess: 1.1 mmol/L (ref 0.0–2.0)
Bicarbonate: 23.4 mmol/L (ref 20.0–28.0)
Drawn by: 732101
O2 Content: 5 L/min
O2 Saturation: 100 %
Patient temperature: 36.7
pCO2 arterial: 30 mmHg — ABNORMAL LOW (ref 32–48)
pH, Arterial: 7.5 — ABNORMAL HIGH (ref 7.35–7.45)
pO2, Arterial: 191 mmHg — ABNORMAL HIGH (ref 83–108)

## 2023-11-05 LAB — BPAM RBC
Blood Product Expiration Date: 202505312359
Blood Product Expiration Date: 202505312359
ISSUE DATE / TIME: 202505060601
ISSUE DATE / TIME: 202505060924
Unit Type and Rh: 6200
Unit Type and Rh: 6200

## 2023-11-05 LAB — BRAIN NATRIURETIC PEPTIDE: B Natriuretic Peptide: 954.6 pg/mL — ABNORMAL HIGH (ref 0.0–100.0)

## 2023-11-05 LAB — BASIC METABOLIC PANEL WITH GFR
Anion gap: 12 (ref 5–15)
BUN: 13 mg/dL (ref 8–23)
CO2: 21 mmol/L — ABNORMAL LOW (ref 22–32)
Calcium: 8.3 mg/dL — ABNORMAL LOW (ref 8.9–10.3)
Chloride: 98 mmol/L (ref 98–111)
Creatinine, Ser: 0.86 mg/dL (ref 0.61–1.24)
GFR, Estimated: >60 mL/min (ref >60)
Glucose, Bld: 96 mg/dL (ref 70–99)
Potassium: 3.3 mmol/L — ABNORMAL LOW (ref 3.5–5.1)
Sodium: 131 mmol/L — ABNORMAL LOW (ref 135–145)

## 2023-11-05 LAB — RAPID URINE DRUG SCREEN, HOSP PERFORMED
Amphetamines: NOT DETECTED
Barbiturates: NOT DETECTED
Benzodiazepines: NOT DETECTED
Cocaine: NOT DETECTED
Opiates: NOT DETECTED
Tetrahydrocannabinol: NOT DETECTED

## 2023-11-05 LAB — COMPREHENSIVE METABOLIC PANEL WITH GFR
ALT: 23 U/L (ref 0–44)
AST: 78 U/L — ABNORMAL HIGH (ref 15–41)
Albumin: 2.7 g/dL — ABNORMAL LOW (ref 3.5–5.0)
Alkaline Phosphatase: 58 U/L (ref 38–126)
Anion gap: 16 — ABNORMAL HIGH (ref 5–15)
BUN: 12 mg/dL (ref 8–23)
CO2: 19 mmol/L — ABNORMAL LOW (ref 22–32)
Calcium: 8.7 mg/dL — ABNORMAL LOW (ref 8.9–10.3)
Chloride: 100 mmol/L (ref 98–111)
Creatinine, Ser: 0.91 mg/dL (ref 0.61–1.24)
GFR, Estimated: >60 mL/min (ref >60)
Glucose, Bld: 152 mg/dL — ABNORMAL HIGH (ref 70–99)
Potassium: 4.3 mmol/L (ref 3.5–5.1)
Sodium: 135 mmol/L (ref 135–145)
Total Bilirubin: 1.4 mg/dL — ABNORMAL HIGH (ref 0.0–1.2)
Total Protein: 5.7 g/dL — ABNORMAL LOW (ref 6.5–8.1)

## 2023-11-05 LAB — MAGNESIUM
Magnesium: 1.6 mg/dL — ABNORMAL LOW (ref 1.7–2.4)
Magnesium: 1.4 mg/dL — ABNORMAL LOW (ref 1.7–2.4)

## 2023-11-05 LAB — PREPARE RBC (CROSSMATCH)

## 2023-11-05 LAB — CLOSTRIDIUM DIFFICILE BY PCR, REFLEXED: Toxigenic C. Difficile by PCR: POSITIVE — AB

## 2023-11-05 MED ORDER — VANCOMYCIN HCL 125 MG PO CAPS
125.0000 mg | ORAL_CAPSULE | Freq: Four times a day (QID) | ORAL | Status: DC
  Administered 2023-11-05 – 2023-11-08 (×12): 125 mg via ORAL
  Filled 2023-11-05 (×17): qty 1

## 2023-11-05 MED ORDER — OXYCODONE HCL 5 MG PO TABS
5.0000 mg | ORAL_TABLET | ORAL | Status: DC | PRN
  Administered 2023-11-05: 5 mg via ORAL
  Filled 2023-11-05: qty 1

## 2023-11-05 MED ORDER — IOHEXOL 350 MG/ML SOLN
75.0000 mL | Freq: Once | INTRAVENOUS | Status: AC | PRN
  Administered 2023-11-05: 75 mL via INTRAVENOUS

## 2023-11-05 MED ORDER — MAGNESIUM SULFATE 2 GM/50ML IV SOLN
2.0000 g | Freq: Once | INTRAVENOUS | Status: AC
Start: 2023-11-06 — End: 2023-11-06
  Administered 2023-11-06: 2 g via INTRAVENOUS
  Filled 2023-11-05: qty 50

## 2023-11-05 MED ORDER — LACTATED RINGERS IV BOLUS
1000.0000 mL | INTRAVENOUS | Status: AC
  Administered 2023-11-05: 1000 mL via INTRAVENOUS

## 2023-11-05 MED ORDER — ALBUMIN HUMAN 25 % IV SOLN
25.0000 g | INTRAVENOUS | Status: AC
  Administered 2023-11-05: 25 g via INTRAVENOUS
  Filled 2023-11-05: qty 100

## 2023-11-05 MED ORDER — SACCHAROMYCES BOULARDII 250 MG PO CAPS
250.0000 mg | ORAL_CAPSULE | Freq: Two times a day (BID) | ORAL | Status: DC
  Administered 2023-11-05 – 2023-11-08 (×6): 250 mg via ORAL
  Filled 2023-11-05 (×6): qty 1

## 2023-11-05 MED ORDER — POTASSIUM CHLORIDE CRYS ER 20 MEQ PO TBCR
40.0000 meq | EXTENDED_RELEASE_TABLET | ORAL | Status: AC
  Administered 2023-11-05 (×2): 40 meq via ORAL
  Filled 2023-11-05 (×2): qty 2

## 2023-11-05 MED ORDER — PERFLUTREN LIPID MICROSPHERE
1.0000 mL | INTRAVENOUS | Status: DC | PRN
  Administered 2023-11-05: 2 mL via INTRAVENOUS

## 2023-11-05 MED ORDER — LACTATED RINGERS IV SOLN: INTRAVENOUS | Status: DC

## 2023-11-05 MED ORDER — METRONIDAZOLE 500 MG/100ML IV SOLN
500.0000 mg | Freq: Two times a day (BID) | INTRAVENOUS | Status: DC
  Administered 2023-11-06 – 2023-11-07 (×5): 500 mg via INTRAVENOUS
  Filled 2023-11-05 (×6): qty 100

## 2023-11-05 MED ORDER — VANCOMYCIN HCL 750 MG/150ML IV SOLN
750.0000 mg | Freq: Two times a day (BID) | INTRAVENOUS | Status: DC
  Administered 2023-11-06: 750 mg via INTRAVENOUS
  Filled 2023-11-05: qty 150

## 2023-11-05 MED ORDER — PANTOPRAZOLE SODIUM 40 MG IV SOLR
40.0000 mg | Freq: Two times a day (BID) | INTRAVENOUS | Status: DC
  Administered 2023-11-06 – 2023-11-08 (×5): 40 mg via INTRAVENOUS
  Filled 2023-11-05 (×6): qty 10

## 2023-11-05 MED ORDER — SODIUM CHLORIDE 0.9 % IV SOLN
2.0000 g | Freq: Three times a day (TID) | INTRAVENOUS | Status: DC
  Administered 2023-11-05 – 2023-11-06 (×2): 2 g via INTRAVENOUS
  Filled 2023-11-05 (×2): qty 12.5

## 2023-11-05 MED ORDER — MAGNESIUM SULFATE 2 GM/50ML IV SOLN
2.0000 g | Freq: Once | INTRAVENOUS | Status: AC
  Administered 2023-11-05: 2 g via INTRAVENOUS
  Filled 2023-11-05: qty 50

## 2023-11-05 MED ORDER — SODIUM CHLORIDE 0.9% IV SOLUTION: Freq: Once | INTRAVENOUS | Status: AC

## 2023-11-05 MED ORDER — IOHEXOL 350 MG/ML SOLN
100.0000 mL | Freq: Once | INTRAVENOUS | Status: AC | PRN
  Administered 2023-11-05: 100 mL via INTRAVENOUS

## 2023-11-05 MED ORDER — PANTOPRAZOLE SODIUM 40 MG IV SOLR
80.0000 mg | INTRAVENOUS | Status: AC
  Administered 2023-11-05: 80 mg via INTRAVENOUS
  Filled 2023-11-05: qty 20

## 2023-11-05 MED ORDER — LACTATED RINGERS IV BOLUS: 500.0000 mL | INTRAVENOUS | Status: DC

## 2023-11-05 MED ORDER — BOOST / RESOURCE BREEZE PO LIQD CUSTOM
1.0000 | Freq: Three times a day (TID) | ORAL | Status: DC
  Administered 2023-11-05 – 2023-11-08 (×7): 1 via ORAL

## 2023-11-05 MED ORDER — VANCOMYCIN HCL 1500 MG/300ML IV SOLN
1500.0000 mg | Freq: Once | INTRAVENOUS | Status: AC
  Administered 2023-11-05: 1500 mg via INTRAVENOUS
  Filled 2023-11-05: qty 300

## 2023-11-05 MED ORDER — LEVALBUTEROL HCL 0.63 MG/3ML IN NEBU: 0.6300 mg | INHALATION_SOLUTION | Freq: Four times a day (QID) | RESPIRATORY_TRACT | Status: DC | PRN

## 2023-11-05 NOTE — Progress Notes (Signed)
 eLink Physician-Brief Progress Note Patient Name: Corey Meyer DOB: 1951/03/01 MRN: [id number]   Date of Service  11/05/2023  HPI/Events of Note  Patient admitted with GI bleeding, and transferred to the ICU after he developed hypoxic respiratory failure, and paroxysmal atrial fibrillation.  eICU Interventions  New Patient Evaluation.        Shari Daughters Joette Schmoker 11/05/2023, 10:19 PM

## 2023-11-05 NOTE — Progress Notes (Signed)
 Pharmacy Antibiotic Note  Corey Meyer is a 73 y.o. male with hx afib on Eliquis  PTA who presented to the ED on 11/03/2023 with c/o rectal bleeding.  Pharmacy has been consulted on 11/05/23 to dose vancomycin  and cefepime  for sepsis.   Plan: - cefepime  2gm IV q8h - vancomycin  1500 mg IV x1, then 750 mg q12h for est AUC 448  _____________________________________________  Height: 5\' 9"  (175.3 cm) Weight: 70.5 kg (155 lb 6.8 oz) IBW/kg (Calculated) : 70.7  Temp (24hrs), Avg:98.2 F (36.8 C), Min:97 F (36.1 C), Max:98.7 F (37.1 C)  Recent Labs  Lab 11/04/23 0003 11/04/23 0400 11/04/23 1847 11/04/23 2300 11/05/23 0635 11/05/23 1929  WBC 10.3 10.6* 10.8* 9.4 7.6  --   CREATININE 0.95 0.88  --   --  0.86 0.91  LATICACIDVEN  --   --   --   --   --  4.6*    Estimated Creatinine Clearance: 75.3 mL/min (by C-G formula based on SCr of 0.91 mg/dL).    Allergies  Allergen Reactions   Haldol [Haloperidol Lactate] Swelling and Other (See Comments)    Xerostomia     Thank you for allowing pharmacy to be a part of this patient's care.  Spurgeon Dyer 11/05/2023 8:55 PM

## 2023-11-05 NOTE — Hospital Course (Addendum)
 72yo with h/o DM2 with neuropathy, HTN, HLD, BPH, tobacco use d/o, polysubstance use, afib, and chronic systolic heart failure who presented on 5/5 with bloody stools.  GI consulted, recommends conservative treatment including holding Eliquis .  C diff positive, started on PO Vanc. GI signed off.  Transferred to med surrg but RRT called due to afib and hypoxic respiratory failure so transferred back to ICU.  Improved and no further issues.

## 2023-11-05 NOTE — Progress Notes (Signed)
  Echocardiogram 2D Echocardiogram has been performed.  Emiya Loomer L Myleen Brailsford RDCS 11/05/2023, 8:39 AM

## 2023-11-05 NOTE — Progress Notes (Signed)
 Initial Nutrition Assessment  DOCUMENTATION CODES:   Non-severe (moderate) malnutrition in context of chronic illness  INTERVENTION:  - Clear Liquid diet per MD.  - Boost Breeze po TID, each supplement provides 250 kcal and 9 grams of protein - Once diet advanced, recommend change to:  - Ensure Plus High Protein po BID, each supplement provides 350 kcal and 20 grams of protein.  - Magic cup BID with meals, each supplement provides 290 kcal and 9 grams of protein - Encourage intake as tolerated.  - Continue Multivitamin with minerals daily - Monitor weight trends.   NUTRITION DIAGNOSIS:   Moderate Malnutrition related to chronic illness (alcohol abuse; CHF) as evidenced by moderate fat depletion, mild muscle depletion, percent weight loss (23% within the past 10 months).  GOAL:   Patient will meet greater than or equal to 90% of their needs  MONITOR:   PO intake, Supplement acceptance, Diet advancement, Weight trends  REASON FOR ASSESSMENT:   Malnutrition Screening Tool    ASSESSMENT:   73 y.o. male with PMH significant for DM2, HTN, HLD, tobacco abuse, alcohol abuse/recreational drug use, chronic systolic heart failure and medication noncompliance who presented due to bloody stools and soft blood pressure. Admitted for acute lower GI bleed.  Patient in bed, noted to be a little slow to respond. Cousin/friend/roommate Corey Meyer at bedside assisted in providing history.   UBW reported to be 200# and they note the patient has lost a lot of weight recently due to drinking alcohol and not eating.  Per EMR, patient weighed at 202# in July 2024 and now weighed at 155#. This is a 47# or 23% weight loss within the past 10 months, which is significant for the time frame.   Patient states he has been drinking a lot of vodka recently and not eating hardly anything. Corey Meyer reports the patient will sometimes eat soup but not much else.   Patient is currently hungry. Ordered patient a tray. He  remains in full liquids at this time. Patient agreeable to try Ensure and Magic Cup once diet advanced.    Medications reviewed and include: 1mg  folic acid , MVI, 100mg  thiamine   Labs reviewed:  Na 131 K+ 3.3 Magnesium  1.6 HA1C 5.8 Blood Glucose 84-135 x24 hours  NUTRITION - FOCUSED PHYSICAL EXAM:  Flowsheet Row Most Recent Value  Orbital Region Mild depletion  Upper Arm Region Moderate depletion  Thoracic and Lumbar Region Moderate depletion  Buccal Region No depletion  Temple Region Mild depletion  Clavicle Bone Region Mild depletion  Clavicle and Acromion Bone Region Mild depletion  Scapular Bone Region Unable to assess  Dorsal Hand Mild depletion  Patellar Region Mild depletion  Anterior Thigh Region Mild depletion  Posterior Calf Region Moderate depletion  Edema (RD Assessment) None  Hair Reviewed  Eyes Reviewed  Mouth Reviewed  Skin Reviewed  Nails Reviewed       Diet Order:   Diet Order             Diet clear liquid Room service appropriate? Yes with Assist; Fluid consistency: Thin  Diet effective now                   EDUCATION NEEDS:  Education needs have been addressed  Skin:  Skin Assessment: Reviewed RN Assessment  Last BM:  5/6 - type 6  Height:  Ht Readings from Last 1 Encounters:  11/04/23 5\' 9"  (1.753 m)   Weight:  Wt Readings from Last 1 Encounters:  11/05/23 70.5 kg  Ideal Body Weight:  72.73 kg  BMI:  Body mass index is 22.95 kg/m.  Estimated Nutritional Needs:  Kcal:  1950-2100 kcals Protein:  90-105 grams Fluid:  >/= 2L    Corey Meyer RD, LDN Contact via Secure Chat.

## 2023-11-05 NOTE — Significant Event (Addendum)
 73 year old man history of CHF with reduced EF 20 to 25%, essential hypertension hyperlipidemia BPH, chronic smoking, chronic alcohol abuse and recreational drug use, history of paroxysmal atrial fibrillation noncompliance with medication and Eliquis , chronic systolic heart failure with medication noncompliance presented, non-insulin -dependent DM type II, BPH, and CKD stage II emergency department complaining of blood in stool and soft blood pressure at home. Further workup revealed low hemoglobin patient received 2 unit of blood transfusion, C. difficile positive has been evaluated by GI recommended vancomycin  and no need for EGD/colonoscopy given by GI concern for GI bleed from diverticular origin.   Received a call from RN that pulse ox showing O2 sat 70%.  Atrial fibrillation rate controlled to 90s and bp  119/69. During evaluation at bedside patient is hemodynamically stable however seems very uncomfortable secondary to generalized abdominal pain.    Acute hypoxia respiratory failure-resolved -O2 sat dropped to 70% on room air and bedside telemetry showing patient is in and out atrial fibrillation.  -Currently O2 sat 100% on 15 L nonrebreather. MAP 92. -Checking chest x-ray, ABG and BNP. -Given patient has chronic atrial fibrillation noncompliance to Eliquis  in the setting of tachycardia and hypoxia concern for development of underlying pulmonary embolism. - Checking D-dimer and obtaining CT chest PE as well. - Per chart review patient has history of CHF EF of 20 to 25%. -As patient O2 sat has been improved to 100% on nonrebreather will continue to keep nonrebreather, keeping patient n.p.o. and if needed will start BiPAP. Addendum -Elevated BNP 954.  However Chest x-ray no evidence of pulmonary edema and active disease process. -CTA chest no evidence of pulmonary embolism.  Trace bilateral pleural effusion. -Has been transition nonrebreather 15 L to 3 L oxygen O2 sat 100%.  Lower GI  bleed C. difficile colitis -Patient has bloody bowel movement.  RN reported that patient is having intermittent bloody bowel movement since admission.  Currently has been treated for C. difficile colitis. -Per review patient found to have FOBT positive 5/6. -Obtaining stat CT angio bleed study, CBC, type and screen and checking lactic acid level. -Preparing 2 units of blood in case patient will need blood transfusion. -If GI study shows any active GI bleed will inform on-call [facility]. -Giving Protonix  80 mg IV once.  Continue Protonix  40 mg twice daily. Addendum - CBC showing stable H&H 11.3 and 32.  Platelet count slightly dropped to 149.  No leukocytosis.  Continue monitor H&H and transfuse as needed to keep hemoglobin above 8. -CT angio GI bleed no evidence of active GI bleeding.  It showed evidence of chronic colitis.  There is 1.7 cm lesion hepatic lesion.  Need to obtain MRI hepatic protocol. -As CT angio GI no evidence of acute GI bleed and it is showing evidence of colitis.   Resuming clear liquid diet and will advance as patient tolerates clear liquid diet without significant abdominal pain.   Sepsis-likely secondary from C. difficile colitis - Patient is tachycardic and hypoxic.  Lactic acid is elevated 4.6. -Pending CBC. -Chest x-ray unremarkable. -Check UA, urine culture, blood culture. - At this point sepsis is driven by underlying C. difficile colitis.  Already has been treating with oral vancomycin . -In the setting of elevated lactic acid 4.6 and at the same time patient has low EF 20 to 25% unable to give excessive fluid resuscitation. Treating with LR 1L bolus and afterward continue LR 100 cc/h and giving albumin 25 g. -Continue oral vancomycin  for the management of C. difficile colitis. -  Starting IV vancomycin  and cefepime  for management of sepsis.  Can de-escalate antibiotics based on blood cultures result. -Continue Coreg  (history of paroxysmal atrial fibrillation).   Holding any other GDMT guided medication to initiate in the setting of sepsis. Addendum - CT abdomen no evidence of GI bleed.  CT chest no evidence of PE.  However CT abdomen showed evidence of diffuse colonic wall thickening and mucosal hyper enhancement greatest in the rectum and right colon consistent with colitis. -Continue to treat patient with IV vancomycin , IV cefepime , IV metronidazole and oral vancomycin .  Based on the blood culture result can de-escalate antibiotic.    History of paroxysmal Atrial fibrillation History of chronic systolic heart failure EF 20 to 25% -Patient has been chronically noncompliant with heart failure medication and Eliquis . - Continue Coreg .  Continue to hold Eliquis  in the setting of GI bleed.   Hypomagnesemia - Low magnesium  level 1.4.  Replating with IV mag sulfate 2 g   Viveka Wilmeth, MD [facility] 11/05/2023, 11:36 PM  CRITICAL CARE Performed by: Ahnaf Caponi, MD     Total critical care time: 45 minutes   Critical care time was exclusive of separately billable procedures and treating other patients.   Critical care was necessary to treat or prevent imminent or life-threatening deterioration.   Critical care was time spent personally by me on the following activities: development of treatment plan with patient and/or surrogate as well as nursing, discussions with consultants, evaluation of patient's response to treatment, examination of patient, obtaining history from patient or surrogate, ordering and performing treatments and interventions, ordering and review of laboratory studies, ordering and review of radiographic studies, pulse oximetry and re-evaluation of patient's condition.

## 2023-11-05 NOTE — Consult Note (Signed)
 NAME:  Corey Meyer, MRN:  [id number], DOB:  1950/11/01, LOS: 0 ADMISSION DATE:  11/03/2023, CONSULTATION DATE:  11/05/2023  REFERRING MD:  Sundil, TRH, CHIEF COMPLAINT:  LGIB, hypoxia   History of Present Illness:  73 year old man with PAF on Eliquis  admitted with lower GI bleed.  He reported acute onset maroon-colored stools.  He drinks half bottle of liquor every day.  Seen by GI, felt to be diverticular bleeding with seem to be resolving hence conservative follow-up recommended.  Prior EGD 10/2021 showed chronic gastritis and colonoscopy was negative except for 8 mm polyp. He developed hypoxia with sats 70% on room air on 5/7 and was transferred to stepdown unit, placed on nonrebreather.  He had paroxysmal A-fib and spontaneously converted to sinus rhythm.  He had recurrent bloody bowel movement and about 100 cc noted in canister Hence PCCM consulted   Pertinent  Medical History  HFrEF -40 to 45% Paroxysmal atrial fibrillation Diabetes type 2 CKD stage II Hypertension Substance abuse -EtOH, cocaine -last use 2 months ago per patient  Significant Hospital Events: Including procedures, antibiotic start and stop dates in addition to other pertinent events   5/7 echo shows drop in EF 20 to 25%  Interim History / Subjective:  Lying supine on nonrebreather Denies chest pain or dyspnea No abdominal pain  Objective   Blood pressure 119/69, pulse 84, temperature 98.1 F (36.7 C), temperature source Axillary, resp. rate 11, height 5\' 9"  (1.753 m), weight 70.5 kg, SpO2 100%.        Intake/Output Summary (Last 24 hours) at 11/05/2023 1954 Last data filed at 11/05/2023 1612 Gross per 24 hour  Intake 579.93 ml  Output --  Net 579.93 ml   Filed Weights   11/04/23 0300 11/05/23 0102 11/05/23 1927  Weight: 70.5 kg 70.5 kg 70.5 kg    Examination: General: Chronically ill appearing elderly man, lying supine, no distress on nonrebreather HENT: Mild pallor, no icterus, no JVD, dry  mucosa Lungs: Clear breath sounds bilateral, no accessory muscle use Cardiovascular: S1-S2 regular, no murmur Abdomen: Soft, nontender, no guarding Extremities: No edema, no deformity Neuro: Alert, interactive, nonfocal   Labs show hypokalemia, hypomagnesemia 1.6, no leukocytosis, hemoglobin stable at 10.2, repeat CBC pending Chest x-ray shows clear lungs, no infiltrates  Resolved Hospital Problem list     Assessment & Plan:  Lower GI bleed -presumed diverticular, recurrent in spite of stopping Eliquis , painless and benign exam Proceed with CT angiogram abdomen/pelvis to localize If negative may need colonoscopy this admission and reconsult GI Follow H&H every 6 hours and transfuse as needed INR was normal on admission and minimal LFT abnormality, very mild thrombocytopenia  C. difficile colitis -continue oral vancomycin   Acute hypoxic respiratory failure -unclear cause, clear chest x-ray. Was able to dial down to 5 L nasal cannula, continue to decrease as needed. No evidence of fluid overload but if hypoxia persists, consider gentle diuresis  HFrEF -further drop in EF to 20 to 25% Resume Entresto, Coreg  if he remains hemodynamically stable Cardiology consult at some point  Paroxysmal atrial fibrillation-currently rate controlled in sinus rhythm Eliquis  on hold  PCCM will follow along     Best Practice (right click and "Reselect all SmartList Selections" daily)   Diet/type: NPO DVT prophylaxis SCD GI prophylaxis: PPI Lines: N/A Foley:  N/A Code Status:  full code Last date of multidisciplinary goals of care discussion [NA]  Labs   CBC: Recent Labs  Lab 11/04/23 0003 11/04/23 0400 11/04/23 1847 11/04/23 2300 11/05/23  0635  WBC 10.3 10.6* 10.8* 9.4 7.6  NEUTROABS 8.3* 9.1*  --   --   --   HGB 8.2* 8.3* 10.9* 11.0* 10.2*  HCT 22.9* 24.3* 31.8* 33.8* 29.8*  MCV 102.7* 103.4* 96.4 100.9* 97.1  PLT 195 198 162 167 157    Basic Metabolic Panel: Recent Labs   Lab 11/04/23 0003 11/04/23 0400 11/05/23 0635  NA 136 137 131*  K 3.0* 3.2* 3.3*  CL 97* 98 98  CO2 24 19* 21*  GLUCOSE 137* 149* 96  BUN 15 15 13   CREATININE 0.95 0.88 0.86  CALCIUM  8.5* 8.8* 8.3*  MG  --  1.8 1.6*   GFR: Estimated Creatinine Clearance: 79.7 mL/min (by C-G formula based on SCr of 0.86 mg/dL). Recent Labs  Lab 11/04/23 0400 11/04/23 1847 11/04/23 2300 11/05/23 0635  WBC 10.6* 10.8* 9.4 7.6    Liver Function Tests: Recent Labs  Lab 11/04/23 0003 11/04/23 0400  AST 77* 74*  ALT 26 29  ALKPHOS 65 67  BILITOT 1.4* 1.3*  PROT 6.3* 6.8  ALBUMIN 3.1* 3.4*   No results for input(s): "LIPASE", "AMYLASE" in the last 168 hours. No results for input(s): "AMMONIA" in the last 168 hours.  ABG No results found for: "PHART", "PCO2ART", "PO2ART", "HCO3", "TCO2", "ACIDBASEDEF", "O2SAT"   Coagulation Profile: Recent Labs  Lab 11/04/23 0003  INR 1.0    Cardiac Enzymes: No results for input(s): "CKTOTAL", "CKMB", "CKMBINDEX", "TROPONINI" in the last 168 hours.  HbA1C: Hgb A1c MFr Bld  Date/Time Value Ref Range Status  11/04/2023 04:00 AM 5.8 (H) 4.8 - 5.6 % Final    Comment:    (NOTE) Pre diabetes:          5.7%-6.4%  Diabetes:              >6.4%  Glycemic control for   <7.0% adults with diabetes   02/27/2022 01:30 PM 7.0 (H) 4.8 - 5.6 % Final    Comment:    (NOTE) Pre diabetes:          5.7%-6.4%  Diabetes:              >6.4%  Glycemic control for   <7.0% adults with diabetes     CBG: Recent Labs  Lab 11/04/23 1703 11/05/23 0115 11/05/23 0816 11/05/23 1225 11/05/23 1803  GLUCAP 135* 84 98 117* 138*    Review of Systems:   Constitutional: negative for anorexia, fevers and sweats  Eyes: negative for irritation, redness and visual disturbance  Ears, nose, mouth, throat, and face: negative for earaches, epistaxis, nasal congestion and sore throat  Respiratory: negative for cough, dyspnea on exertion, sputum and wheezing   Cardiovascular: negative for chest pain, dyspnea, lower extremity edema, orthopnea, palpitations and syncope  Gastrointestinal: negative for abdominal pain, constipation, diarrhea, nausea and vomiting  Genitourinary:negative for dysuria, frequency and hematuria  Hematologic/lymphatic: negative for bleeding, easy bruising and lymphadenopathy  Musculoskeletal:negative for arthralgias, muscle weakness and stiff joints  Neurological: negative for coordination problems, gait problems, headaches and weakness  Endocrine: negative for diabetic symptoms including polydipsia, polyuria and weight loss   Past Medical History:  He,  has a past medical history of Alcoholism (HCC), Anemia, Benign prostate hyperplasia, Controlled diabetes mellitus type II without complication (HCC), Essential hypertension, Hepatitis C, Hypercholesteremia, and Myocardial infarction (HCC).   Surgical History:   Past Surgical History:  Procedure Laterality Date   HEMORRHOID SURGERY     HERNIA REPAIR     KNEE ARTHROPLASTY Left  LEFT HEART CATH AND CORONARY ANGIOGRAPHY N/A 12/25/2018   Procedure: LEFT HEART CATH AND CORONARY ANGIOGRAPHY;  Surgeon: Knox Perl, MD;  Location: [facility];  Service: Cardiovascular;  Laterality: N/A;   TRANSURETHRAL RESECTION OF PROSTATE N/A 03/18/2022   Procedure: TRANSURETHRAL RESECTION OF THE PROSTATE (TURP), LASER LITHOLAPAXY;  Surgeon: Samson Croak, MD;  Location: [facility];  Service: Urology;  Laterality: N/A;     Social History:   reports that he has been smoking cigarettes. He has a 30 pack-year smoking history. He has never used smokeless tobacco. He reports current alcohol use of about 6.0 standard drinks of alcohol per week. He reports current drug use. Drug: Cocaine.   Family History:  His family history is not on file.   Allergies Allergies  Allergen Reactions   Haldol [Haloperidol Lactate] Swelling and Other (See Comments)    Xerostomia     Home Medications   Prior to Admission medications   Medication Sig Start Date End Date Taking? Authorizing Provider  apixaban  (ELIQUIS ) 5 MG TABS tablet Take 5 mg by mouth 2 (two) times daily. Patient not taking: Reported on 09/17/2023 11/26/22   [provider]  carvedilol  (COREG ) 6.25 MG tablet Take 1 tablet (6.25 mg total) by mouth 2 (two) times daily. Patient not taking: Reported on 09/17/2023 01/04/22 01/04/23  Burton Casey, MD  folic acid  (FOLVITE ) 1 MG tablet Take 1 mg by mouth daily. Patient not taking: Reported on 09/17/2023    [provider]  glipiZIDE  (GLUCOTROL ) 5 MG tablet Take 1 tablet (5 mg total) by mouth daily before breakfast. Patient not taking: Reported on 09/17/2023 03/11/22   Ephriam Hashimoto, MD  metFORMIN  (GLUCOPHAGE ) 1000 MG tablet Take 1,000 mg by mouth 2 (two) times daily with a meal. Patient not taking: Reported on 09/17/2023    [provider]  metoprolol  succinate (TOPROL -XL) 25 MG 24 hr tablet Take 0.5 tablets by mouth daily. Patient not taking: Reported on 11/04/2023 09/25/23   [provider]  montelukast  (SINGULAIR ) 10 MG tablet Take 10 mg by mouth at bedtime. Patient not taking: Reported on 09/17/2023    [provider]  omeprazole (PRILOSEC) 20 MG capsule Take 20 mg by mouth daily as needed (indigestion). Patient not taking: Reported on 09/17/2023    [provider]  pantoprazole  (PROTONIX ) 20 MG tablet Take 1 tablet (20 mg total) by mouth daily for 5 days. Patient not taking: Reported on 09/17/2023 12/30/22 01/04/23  Gowens, Mariah L, PA-C  potassium chloride  SA (KLOR-CON  M) 20 MEQ tablet Take 1 tablet (20 mEq total) by mouth daily. Patient not taking: Reported on 09/17/2023 01/04/22   Burton Casey, MD  sacubitril-valsartan (ENTRESTO) 49-51 MG Take 1 tablet by mouth 2 (two) times daily. Patient not taking: Reported on 09/17/2023 10/18/22   [provider]  simvastatin  (ZOCOR ) 20 MG tablet Take 20 mg by mouth daily at  6 PM. Patient not taking: Reported on 09/17/2023 11/26/22   [provider]  tamsulosin  (FLOMAX ) 0.4 MG CAPS capsule Take 0.4 mg by mouth at bedtime. Patient not taking: Reported on 09/17/2023    [provider]  potassium chloride  20 MEQ TBCR Take 20 mEq by mouth daily. Patient not taking: Reported on 12/31/2021 01/06/19 01/04/22  Knox Perl, MD     Critical care time: 49m    Celene Coins MD. [facility]. [place] [facility] Pager : [phone]  If no response to pager , please call [phone] until  7 pm After 7:00 pm call Elink  [phone]   11/05/2023

## 2023-11-05 NOTE — Progress Notes (Signed)
 eLink Physician-Brief Progress Note Patient Name: Corey Meyer DOB: [birth year] MRN: [id number]   Date of Service  11/05/2023  HPI/Events of Note  Patient has C-Diff.  eICU Interventions  Enteric precautions ordered.        [facility] Sharon Stapel 11/05/2023, 9:45 PM

## 2023-11-05 NOTE — Progress Notes (Addendum)
 Corey Meyer 1:32 PM  Subjective: Patient seen and examined and case discussed with my partner Dr. Honey Lusty and his nurse and he has no abdominal pain and no blood today and wants to eat has no new complaints  Objective: Vital signs stable afebrile no acute distress abdomen is soft nontender chemistry stable hemoglobin minimal decrease C. difficile positive  Assessment: Seemingly resolved GI bleeding questionably from C. difficile for diverticuli and patient on blood thinners  Plan: Okay to try soft diet agree with vancomycin  and please call me if I could be of any further assistance with this hospital stay otherwise follow-up in the office with his primary [facility] gastroenterologist in a few weeks to make sure no further workup and plan is needed  [facility]  office [phone] After 5PM or if no answer call [phone]

## 2023-11-05 NOTE — Progress Notes (Signed)
 Progress Note   Patient: Corey Meyer AVW:[id number] DOB: 10-15-1950 DOA: 11/03/2023     0 DOS: the patient was seen and examined on 11/05/2023   Brief hospital course: 73yo with h/o DM2 with neuropathy, HTN, HLD, BPH, tobacco use d/o, polysubstance use, afib, and chronic systolic heart failure who presented on 5/5 with bloody stools.  GI consulted, recommends conservative treatment including holding Eliquis .  C diff positive, started on PO Vanc. GI signed off.  Assessment and Plan:  C difficile colitis Patient presenting with concern for GI bleeding (unrelated to Eliquis , as he is noncompliant with this medication) Bleeding seems to have stopped but he is having persistent diarrhea, likely related to C diff colitis C diff POSITIVE Observing in progressive -> med surg Clear liquids, advance diet as tolerated prn fentanyl  for pain   prn Zofran  for nausea  Starting oral vancomycin  per protocol Start probiotics GI consulted, has signed off Good hand-washing (rather than use of alcohol gel) will be critical to decrease the risk of spread.  Given prior hx of gastritis will continue PPI   Chronic systolic CHF (congestive heart failure)  Last echo over 3 years ago with EF 40-45% Current echo with EF 20-25% Medication compliance must be encouraged Low sodium diet advised Resume GDMT management at discharge Outpatient cardiology follow up    Non compliance w medication regimen Education and importance with compliance has been provided Patient will need prescriptions at discharge to help him restart regimen until follow up with PCP   Atrial fibrillation, chronic Non-compliant with eliquis  or rate control agents Appears paroxysmal in nature   Type 2 diabetes with nephropathy  A1c is 5.8, good control despite medication noncompliance SSI has been ordered   Alcohol use Active withdrawal does not appear to be currently present On CIWA protocol MVI, thiamine , and folic acid   ordered Cessation counseling provided Normal B12 level   BPH (benign prostatic hyperplasia) Resume flomax  (one of the few meds that he takes the most) No retention complaints currently reported   Hypomagnesemia/hypokalemia Associated with alcohol abuse most likely Will replete electrolytes and follow trend  Tobacco/recreational drug use Cessation counseling provided Nicotine patch provided UDS ordered, not done TOC has been asked to provide outpatient resources to help him quitting.      Consultants: GI  Procedures: None  Antibiotics: Vancomycin  5/7-      Subjective: Feeling some better.  Still having diarrhea but currently needs to void.   Objective: Vitals:   11/05/23 1300 11/05/23 1400  BP: 100/70 (!) 116/93  Pulse:    Resp: 10 (!) 27  Temp:    SpO2:      Intake/Output Summary (Last 24 hours) at 11/05/2023 1552 Last data filed at 11/04/2023 2353 Gross per 24 hour  Intake 674.6 ml  Output --  Net 674.6 ml   Filed Weights   11/04/23 0300 11/05/23 8295  Weight: 70.5 kg 70.5 kg    Exam:  General:  Appears calm and comfortable and is in NAD Eyes:   normal lids, iris ENT:  grossly normal hearing, lips & tongue, mmm Cardiovascular:  RRR. No LE edema.  Respiratory:   CTA bilaterally with no wheezes/rales/rhonchi.  Normal respiratory effort. Abdomen:  soft, NT, ND Skin:  no rash or induration seen on limited exam Musculoskeletal:  grossly normal tone BUE/BLE, good ROM, no bony abnormality Psychiatric:  grossly normal mood and affect, speech fluent and appropriate, AOx3 Neurologic:  CN 2-12 grossly intact, moves all extremities in coordinated fashion  Data Reviewed:  I have reviewed the patient's lab results since admission.  Pertinent labs for today include:   BMP pending WBC 7.6 Hgb 10.2 C diff positive     Family Communication: None present; patient asked to notify his roommate about current infection  Disposition: Status is:  Observation The patient remains OBS appropriate and will d/c before 2 midnights.     Time spent: 50 minutes  Unresulted Labs (From admission, onward)     Start     Ordered   11/06/23 0500  CBC with Differential/Platelet  Tomorrow morning,   R       Question:  Specimen collection method  Answer:  Lab=Lab collect   11/05/23 1542   11/06/23 0500  Comprehensive metabolic panel with GFR  Tomorrow morning,   R       Question:  Specimen collection method  Answer:  Lab=Lab collect   11/05/23 1542   11/04/23 0840  Rapid urine drug screen (hospital performed)  ONCE - STAT,   STAT        11/04/23 1610             Author: Lorita Rosa, MD 11/05/2023 3:52 PM  For on call review [email or web address].

## 2023-11-05 NOTE — Sepsis Progress Note (Signed)
 Elink monitoring for the code sepsis protocol.

## 2023-11-05 NOTE — TOC Initial Note (Signed)
 Transition of Care [facility]) - Initial/Assessment Note    Patient Details  Name: Corey Meyer MRN: [id number] Date of Birth: 01-Mar-1951  Transition of [facility]) CM/SW Contact:    Amaryllis Junior, LCSW Phone Number: 11/05/2023, 2:26 PM  Clinical Narrative:                 Pt from home with friends roommates. Pt continues medical workup. Resources for substance us  added to AVS. TOC following for dc needs.     Barriers to Discharge: Continued Medical Work up   Patient Goals and CMS Choice Patient states their goals for this hospitalization and ongoing recovery are:: return home          Expected Discharge Plan and Services In-house Referral: NA     Living arrangements for the past 2 months: Single Family Home                 DME Arranged: N/A DME Agency: NA       HH Arranged: NA HH Agency: NA        Prior Living Arrangements/Services Living arrangements for the past 2 months: Single Family Home Lives with:: Friends Patient language and need for interpreter reviewed:: Yes Do you feel safe going back to the place where you live?: Yes      Need for Family Participation in Patient Care: Yes (Comment) Care giver support system in place?: Yes (comment)   Criminal Activity/Legal Involvement Pertinent to Current Situation/Hospitalization: No - Comment as needed  Activities of Daily Living   ADL Screening (condition at time of admission) Independently performs ADLs?: Yes (appropriate for developmental age) Is the patient deaf or have difficulty hearing?: No Does the patient have difficulty seeing, even when wearing glasses/contacts?: No Does the patient have difficulty concentrating, remembering, or making decisions?: No  Permission Sought/Granted                  Emotional Assessment Appearance:: Appears stated age     Orientation: : Oriented to Self, Oriented to Place, Oriented to  Time, Oriented to Situation Alcohol / Substance Use: Not Applicable Psych  Involvement: No (comment)  Admission diagnosis:  Hypokalemia [E87.6] Acute lower GI bleeding [K92.2] Gastrointestinal hemorrhage, unspecified gastrointestinal hemorrhage type [K92.2] Patient Active Problem List   Diagnosis Date Noted   Acute lower GI bleeding 11/04/2023   Type 2 diabetes with nephropathy (HCC) 11/04/2023   Non compliance w medication regimen 11/04/2023   BPH (benign prostatic hyperplasia) 03/18/2022   Hypomagnesemia 03/10/2022   Possible UTI associated with chronic indwelling foley 03/09/2022   History of urinary retention 03/09/2022   Hypokalemia 03/09/2022   Alcohol use 03/09/2022   Stage 3a chronic kidney disease (CKD) (HCC) 03/09/2022   HTN (hypertension) 03/09/2022   Paroxysmal atrial fibrillation (HCC) 03/09/2022   History of cardiac arrest 03/09/2022   Chronic systolic CHF (congestive heart failure) (HCC) 03/09/2022   Hypoglycemia 03/09/2022   Acute metabolic encephalopathy 12/30/2021   Alcohol withdrawal (HCC) 12/30/2021   Hypoglycemia associated with diabetes (HCC) 12/30/2021   Seizure (HCC) 12/30/2021   Acute urinary retention 12/30/2021   Acute kidney injury superimposed on chronic kidney disease (HCC) 12/30/2021   Alcoholic gastritis 12/30/2021   Atrial fibrillation, chronic (HCC) 12/30/2021   Acute on chronic systolic and diastolic heart failure, NYHA class 4 (HCC) 01/05/2019   CHF (congestive heart failure) (HCC) 01/04/2019   Acute on chronic systolic and diastolic heart failure, NYHA class 1 (HCC) 01/04/2019   Unstable angina pectoris (HCC) 12/25/2018  PCP:  Clinic, [facility] Pharmacy:   [facility] - [place], [place] - [address] [place] [address] Phone: [phone] Fax: [phone]  [facility] [address] [place] [address] Phone: [phone] Fax: [phone]     Social Drivers of Health (SDOH) Social  History: SDOH Screenings   Food Insecurity: No Food Insecurity (11/04/2023)  Housing: Low Risk  (11/04/2023)  Transportation Needs: No Transportation Needs (11/04/2023)  Utilities: Not At Risk (11/04/2023)  Physical Activity: Unknown (12/25/2018)  Social Connections: Moderately Isolated (11/04/2023)  Stress: No Stress Concern Present (12/25/2018)  Tobacco Use: High Risk (11/03/2023)   SDOH Interventions:     Readmission Risk Interventions    03/11/2022   11:58 AM  Readmission Risk Prevention Plan  PCP or Specialist Appt within 3-5 Days Complete  HRI or Home Care Consult Complete  Social Work Consult for Recovery Care Planning/Counseling Complete  Palliative Care Screening Not Applicable  Medication Review Oceanographer) Complete

## 2023-11-06 DIAGNOSIS — I5022 Chronic systolic (congestive) heart failure: Secondary | ICD-10-CM | POA: Diagnosis present

## 2023-11-06 DIAGNOSIS — I130 Hypertensive heart and chronic kidney disease with heart failure and stage 1 through stage 4 chronic kidney disease, or unspecified chronic kidney disease: Secondary | ICD-10-CM | POA: Diagnosis present

## 2023-11-06 DIAGNOSIS — E872 Acidosis, unspecified: Secondary | ICD-10-CM | POA: Diagnosis present

## 2023-11-06 DIAGNOSIS — F1010 Alcohol abuse, uncomplicated: Secondary | ICD-10-CM | POA: Diagnosis present

## 2023-11-06 DIAGNOSIS — Z79899 Other long term (current) drug therapy: Secondary | ICD-10-CM | POA: Diagnosis not present

## 2023-11-06 DIAGNOSIS — I482 Chronic atrial fibrillation, unspecified: Secondary | ICD-10-CM | POA: Diagnosis present

## 2023-11-06 DIAGNOSIS — J9601 Acute respiratory failure with hypoxia: Secondary | ICD-10-CM | POA: Diagnosis present

## 2023-11-06 DIAGNOSIS — N400 Enlarged prostate without lower urinary tract symptoms: Secondary | ICD-10-CM | POA: Diagnosis present

## 2023-11-06 DIAGNOSIS — K7460 Unspecified cirrhosis of liver: Secondary | ICD-10-CM | POA: Diagnosis present

## 2023-11-06 DIAGNOSIS — E1122 Type 2 diabetes mellitus with diabetic chronic kidney disease: Secondary | ICD-10-CM | POA: Diagnosis present

## 2023-11-06 DIAGNOSIS — I252 Old myocardial infarction: Secondary | ICD-10-CM | POA: Diagnosis not present

## 2023-11-06 DIAGNOSIS — A0472 Enterocolitis due to Clostridium difficile, not specified as recurrent: Secondary | ICD-10-CM | POA: Diagnosis present

## 2023-11-06 DIAGNOSIS — Z7901 Long term (current) use of anticoagulants: Secondary | ICD-10-CM | POA: Diagnosis not present

## 2023-11-06 DIAGNOSIS — E440 Moderate protein-calorie malnutrition: Secondary | ICD-10-CM | POA: Diagnosis present

## 2023-11-06 DIAGNOSIS — I420 Dilated cardiomyopathy: Secondary | ICD-10-CM | POA: Diagnosis present

## 2023-11-06 DIAGNOSIS — E876 Hypokalemia: Secondary | ICD-10-CM | POA: Diagnosis present

## 2023-11-06 DIAGNOSIS — B951 Streptococcus, group B, as the cause of diseases classified elsewhere: Secondary | ICD-10-CM | POA: Diagnosis present

## 2023-11-06 DIAGNOSIS — D500 Iron deficiency anemia secondary to blood loss (chronic): Secondary | ICD-10-CM | POA: Diagnosis present

## 2023-11-06 DIAGNOSIS — N182 Chronic kidney disease, stage 2 (mild): Secondary | ICD-10-CM | POA: Diagnosis present

## 2023-11-06 DIAGNOSIS — E1140 Type 2 diabetes mellitus with diabetic neuropathy, unspecified: Secondary | ICD-10-CM | POA: Diagnosis present

## 2023-11-06 DIAGNOSIS — E7800 Pure hypercholesterolemia, unspecified: Secondary | ICD-10-CM | POA: Diagnosis present

## 2023-11-06 DIAGNOSIS — I480 Paroxysmal atrial fibrillation: Secondary | ICD-10-CM | POA: Diagnosis present

## 2023-11-06 DIAGNOSIS — F17210 Nicotine dependence, cigarettes, uncomplicated: Secondary | ICD-10-CM | POA: Diagnosis present

## 2023-11-06 LAB — GLUCOSE, CAPILLARY
Glucose-Capillary: 113 mg/dL — ABNORMAL HIGH (ref 70–99)
Glucose-Capillary: 128 mg/dL — ABNORMAL HIGH (ref 70–99)
Glucose-Capillary: 194 mg/dL — ABNORMAL HIGH (ref 70–99)
Glucose-Capillary: 99 mg/dL (ref 70–99)

## 2023-11-06 LAB — URINE CULTURE: Culture: 50000 — AB

## 2023-11-06 LAB — COMPREHENSIVE METABOLIC PANEL WITH GFR
ALT: 21 U/L (ref 0–44)
AST: 52 U/L — ABNORMAL HIGH (ref 15–41)
Albumin: 2.8 g/dL — ABNORMAL LOW (ref 3.5–5.0)
Alkaline Phosphatase: 44 U/L (ref 38–126)
Anion gap: 11 (ref 5–15)
BUN: 10 mg/dL (ref 8–23)
CO2: 22 mmol/L (ref 22–32)
Calcium: 8.5 mg/dL — ABNORMAL LOW (ref 8.9–10.3)
Chloride: 100 mmol/L (ref 98–111)
Creatinine, Ser: 0.99 mg/dL (ref 0.61–1.24)
GFR, Estimated: >60 mL/min (ref >60)
Glucose, Bld: 119 mg/dL — ABNORMAL HIGH (ref 70–99)
Potassium: 4.4 mmol/L (ref 3.5–5.1)
Sodium: 133 mmol/L — ABNORMAL LOW (ref 135–145)
Total Bilirubin: 1 mg/dL (ref 0.0–1.2)
Total Protein: 5.2 g/dL — ABNORMAL LOW (ref 6.5–8.1)

## 2023-11-06 LAB — CBC WITH DIFFERENTIAL/PLATELET
Abs Immature Granulocytes: 0.07 10*3/uL (ref 0.00–0.07)
Basophils Absolute: 0 10*3/uL (ref 0.0–0.1)
Basophils Relative: 0 %
Eosinophils Absolute: 0 10*3/uL (ref 0.0–0.5)
Eosinophils Relative: 0 %
HCT: 29 % — ABNORMAL LOW (ref 39.0–52.0)
Hemoglobin: 9.7 g/dL — ABNORMAL LOW (ref 13.0–17.0)
Immature Granulocytes: 1 %
Lymphocytes Relative: 24 %
Lymphs Abs: 1.7 10*3/uL (ref 0.7–4.0)
MCH: 33.1 pg (ref 26.0–34.0)
MCHC: 33.4 g/dL (ref 30.0–36.0)
MCV: 99 fL (ref 80.0–100.0)
Monocytes Absolute: 0.7 10*3/uL (ref 0.1–1.0)
Monocytes Relative: 9 %
Neutro Abs: 4.7 10*3/uL (ref 1.7–7.7)
Neutrophils Relative %: 66 %
Platelets: 137 10*3/uL — ABNORMAL LOW (ref 150–400)
RBC: 2.93 MIL/uL — ABNORMAL LOW (ref 4.22–5.81)
RDW: 18.8 % — ABNORMAL HIGH (ref 11.5–15.5)
WBC: 7.2 10*3/uL (ref 4.0–10.5)
nRBC: 0.7 % — ABNORMAL HIGH (ref 0.0–0.2)

## 2023-11-06 LAB — LACTIC ACID, PLASMA
Lactic Acid, Venous: 4.6 mmol/L (ref 0.5–1.9)
Lactic Acid, Venous: 1.9 mmol/L (ref 0.5–1.9)
Lactic Acid, Venous: 1.9 mmol/L (ref 0.5–1.9)

## 2023-11-06 LAB — PROTIME-INR
INR: 1.1 (ref 0.8–1.2)
Prothrombin Time: 14.4 s (ref 11.4–15.2)

## 2023-11-06 LAB — HEMOGLOBIN AND HEMATOCRIT, BLOOD
HCT: 32.3 % — ABNORMAL LOW (ref 39.0–52.0)
Hemoglobin: 10.5 g/dL — ABNORMAL LOW (ref 13.0–17.0)

## 2023-11-06 LAB — MAGNESIUM: Magnesium: 2.1 mg/dL (ref 1.7–2.4)

## 2023-11-06 LAB — D-DIMER, QUANTITATIVE: D-Dimer, Quant: 9.69 ug{FEU}/mL — ABNORMAL HIGH (ref 0.00–0.50)

## 2023-11-06 MED ORDER — HYDRALAZINE HCL 20 MG/ML IJ SOLN
5.0000 mg | INTRAMUSCULAR | Status: DC | PRN
  Filled 2023-11-06: qty 1

## 2023-11-06 MED ORDER — CHLORHEXIDINE GLUCONATE CLOTH 2 % EX PADS
6.0000 | MEDICATED_PAD | Freq: Every day | CUTANEOUS | Status: DC
Start: 2023-11-06 — End: 2023-11-08
  Administered 2023-11-06 – 2023-11-07 (×2): 6 via TOPICAL

## 2023-11-06 MED ORDER — DIGOXIN 0.0625 MG HALF TABLET
0.0625 mg | ORAL_TABLET | Freq: Every day | ORAL | Status: DC
  Administered 2023-11-07 – 2023-11-08 (×2): 0.0625 mg via ORAL
  Filled 2023-11-06: qty 1
  Filled 2023-11-06: qty 0.5

## 2023-11-06 MED ORDER — DIGOXIN 0.25 MG/ML IJ SOLN
0.2500 mg | Freq: Once | INTRAMUSCULAR | Status: AC
  Administered 2023-11-06: 0.25 mg via INTRAVENOUS
  Filled 2023-11-06: qty 2

## 2023-11-06 NOTE — Progress Notes (Signed)
 Progress Note   Patient: Corey Meyer UYQ:[id number] DOB: 12/04/1950 DOA: 11/03/2023     0 DOS: the patient was seen and examined on 11/06/2023   Brief hospital course: 73yo with h/o DM2 with neuropathy, HTN, HLD, BPH, tobacco use d/o, polysubstance use, afib, and chronic systolic heart failure who presented on 5/5 with bloody stools.  GI consulted, recommends conservative treatment including holding Eliquis .  C diff positive, started on PO Vanc. GI signed off.  Transferred to med surrg but RRT called due to afib and hypoxic respiratory failure so transferred back to ICU.  Assessment and Plan:  C difficile colitis with GI bleeding Patient presenting with concern for GI bleeding (unrelated to Eliquis , as he is noncompliant with this medication) Bleeding continues intermittently and he is having persistent diarrhea, likely related to C diff colitis C diff POSITIVE Admitted to progressive, transferred to med surg but then developed new onset afib with concern for sepsis and transferred to ICU on 5/7 CTA negative for acute GI bleed, did show acute colitis as expected 3 bloody BMs overnight, none as of yet today CLD prn fentanyl  for pain   prn Zofran  for nausea  Started oral vancomycin  per protocol, also now on IV metronidazole Started probiotics GI consulted, has signed off but is reconsulted  Good hand-washing (rather than use of alcohol gel) will be critical to decrease the risk of spread.  Given prior hx of gastritis will continue PPI  Acute hypoxic respiratory failure In the setting of new-onset afib yesterday O2 sats in the 70s, started on NRB O2 CXR/CTA negative Weaned back down to 2L [place] O2 without difficulty  Sepsis associated with C diff colitis Patient went into afib on 5/7pm and had associated tachycardia and hypoxia Lactate 4.6 Given low EF, there was concern about overloading with fluid; given 1L bolus and then 100 cc/hr + 25g albumin  Blood cultures ordered Antiobitcs  extended to IV Vanc/Cefepime /metronidazole + PO Vanc Given resolution of symptoms and low current suspicion for sepsis will stop the Vanc and Cefepime  but continue the metronidazole in the setting of C diff colitis   Chronic systolic CHF (congestive heart failure)  Last echo over 3 years ago with EF 40-45% Current echo with EF 20-25% Medication compliance must be encouraged Low sodium diet advised Resume GDMT management at discharge Cardiology consulted, Dr. Glena Landau to see  Nutrition Status: Nutrition Problem: Moderate Malnutrition Etiology: chronic illness (alcohol abuse; CHF) Signs/Symptoms: moderate fat depletion, mild muscle depletion, percent weight loss (23% within the past 10 months) Percent weight loss: 23 % (within the past 10 months) Interventions: Refer to RD note for recommendations   Non compliance w medication regimen Education and importance with compliance has been provided Patient will need prescriptions at discharge to help him restart regimen until follow up with PCP   Atrial fibrillation, chronic Non-compliant with eliquis  or rate control agents Appears paroxysmal in nature Carvedilol  resumed Continue to hold Eliquis  in the setting of GI bleed   Type 2 diabetes with nephropathy  A1c is 5.8, good control despite medication noncompliance SSI has been ordered   Alcohol use Active withdrawal does not appear to be currently present On CIWA protocol MVI, thiamine , and folic acid  ordered Cessation counseling provided Normal B12 level   BPH (benign prostatic hyperplasia) Resume flomax  (one of the few meds that he takes the most) No retention complaints currently reported   Hypomagnesemia/hypokalemia Associated with alcohol abuse most likely Will replete electrolytes and follow trend   Tobacco/recreational drug use Cessation  counseling provided Nicotine patch provided UDS ordered, not done TOC has been asked to provide outpatient resources to help him  quitting.          Consultants: GI   Procedures: None   Antibiotics: PO Vancomycin  5/7-17 Metronidazole 5/7-14 IV Vancomycin  x 1 dose Cefepime  x 1 dose       Subjective: Feeling ok. No diarrhea since overnight.  Wants to advance his diet.   Objective: Vitals:   11/06/23 1100 11/06/23 1200  BP: 139/63 (!) 153/82  Pulse: 80 81  Resp: 12 18  Temp:    SpO2: 100% 100%    Intake/Output Summary (Last 24 hours) at 11/06/2023 1335 Last data filed at 11/06/2023 1157 Gross per 24 hour  Intake 3098.56 ml  Output 175 ml  Net 2923.56 ml   Filed Weights   11/05/23 0712 11/05/23 1927 11/06/23 0439  Weight: 70.5 kg 70.5 kg 73.4 kg    Exam:  General:  Appears calm and comfortable and is in NAD Eyes:   normal lids, iris ENT:  grossly normal hearing, lips & tongue, mmm Cardiovascular:  RRR. No LE edema.  Respiratory:   CTA bilaterally with no wheezes/rales/rhonchi.  Normal respiratory effort. Abdomen:  soft, NT, ND Skin:  no rash or induration seen on limited exam Musculoskeletal:  grossly normal tone BUE/BLE, good ROM, no bony abnormality Psychiatric:  grossly normal mood and affect, speech fluent and appropriate, AOx3 Neurologic:  CN 2-12 grossly intact, moves all extremities in coordinated fashion  Data Reviewed: I have reviewed the patient's lab results since admission.  Pertinent labs for today include:   Na++ 133, not clinically significant Glucose 119 Albumin 2.8 AST 52/ALT 21/Bili 1, improving Lactate 4.6, 1.9 WBC 7.2 Hgb 9.7, down from 10.2 on 5/7 Platelets 137 D-dimer 9.69 UDS negative Blood cultures pending    Family Communication: None present  Disposition: Status is: Inpatient Remains inpatient appropriate because: ongoing monitoring     Total critical care time: 45 minutes Critical care time was exclusive of separately billable procedures and treating other patients. Critical care was necessary to treat or prevent imminent or  life-threatening deterioration. Critical care was time spent personally by me on the following activities: development of treatment plan with patient and/or surrogate as well as nursing, discussions with consultants, evaluation of patient's response to treatment, examination of patient, obtaining history from patient or surrogate, ordering and performing treatments and interventions, ordering and review of laboratory studies, ordering and review of radiographic studies, pulse oximetry and re-evaluation of patient's condition.   Unresulted Labs (From admission, onward)     Start     Ordered   11/07/23 0500  CBC with Differential/Platelet  Tomorrow morning,   R       Question:  Specimen collection method  Answer:  Lab=Lab collect   11/06/23 1335   11/07/23 0500  Basic metabolic panel with GFR  Tomorrow morning,   R       Question:  Specimen collection method  Answer:  Lab=Lab collect   11/06/23 1335   11/05/23 1941  Respiratory (~20 pathogens) panel by PCR  (Respiratory panel by PCR (~20 pathogens, ~24 hr TAT)  w precautions)  ONCE - URGENT,   URGENT        11/05/23 1941             Author: Lorita Rosa, MD 11/06/2023 1:35 PM  For on call review [email or web address].

## 2023-11-06 NOTE — Plan of Care (Signed)

## 2023-11-06 NOTE — Plan of Care (Signed)
  Problem: Clinical Measurements: Goal: Ability to maintain clinical measurements within normal limits will improve Outcome: Progressing   Problem: Clinical Measurements: Goal: Respiratory complications will improve Outcome: Progressing   Problem: Clinical Measurements: Goal: Cardiovascular complication will be avoided Outcome: Progressing   Problem: Coping: Goal: Level of anxiety will decrease Outcome: Progressing

## 2023-11-06 NOTE — Progress Notes (Signed)
 NAME:  Corey Meyer, MRN:  [id number], DOB:  19-May-1951, LOS: 0 ADMISSION DATE:  11/03/2023, CONSULTATION DATE:  11/06/2023  REFERRING MD:  Sundil, TRH, CHIEF COMPLAINT:  LGIB, hypoxia   History of Present Illness:  73 year old man with PAF on Eliquis  admitted with lower GI bleed.  He reported acute onset maroon-colored stools.  He drinks half bottle of liquor every day.  Seen by GI, felt to be diverticular bleeding with seem to be resolving hence conservative follow-up recommended.  Prior EGD 10/2021 showed chronic gastritis and colonoscopy was negative except for 8 mm polyp. He developed hypoxia with sats 70% on room air on 5/7 and was transferred to stepdown unit, placed on nonrebreather.  He had paroxysmal A-fib and spontaneously converted to sinus rhythm.  He had recurrent bloody bowel movement and about 100 cc noted in canister Hence PCCM consulted.  Pertinent  Medical History  HFrEF -40 to 45% Paroxysmal atrial fibrillation Diabetes type 2 CKD stage II Hypertension Substance abuse -EtOH, cocaine -last use 2 months ago per patient  Significant Hospital Events: Including procedures, antibiotic start and stop dates in addition to other pertinent events   5/7 echo shows drop in EF 20 to 25% 5/8 O2 requirement down to 2L.   Interim History / Subjective:  No complaints RN reports 3 bloody bowel movements overnight, patient does not recall.  Denies abdominal pain, nausea, vomiting.   Objective   Blood pressure (!) 124/54, pulse 92, temperature 97.8 F (36.6 C), temperature source Oral, resp. rate 15, height 5\' 9"  (1.753 m), weight 73.4 kg, SpO2 100%.        Intake/Output Summary (Last 24 hours) at 11/06/2023 1113 Last data filed at 11/06/2023 1033 Gross per 24 hour  Intake 2875.72 ml  Output 175 ml  Net 2700.72 ml   Filed Weights   11/05/23 0712 11/05/23 1927 11/06/23 0439  Weight: 70.5 kg 70.5 kg 73.4 kg    Examination:  General: Adult male in NAD HENT: [place]/AT, PERRL, no  JVD Lungs: Clear bilateral breath sounds. No accessory muscle use.  Cardiovascular: RRR, no MRG Abdomen: Soft, NT, ND Extremities: No acute deformity. No significant edema.  Neuro: Alert, interactive, nonfocal  K 4.4, Na 133, LA 1.9, Hgb 9.7 down from 11.3, plt 137  CTA with no active GI bleed. Diffuse colonic wall thickening. Two indeterminate enhancing lesions. Liver protocol MRI recommended  Resolved Hospital Problem list     Assessment & Plan:   Lower GI bleed - presumed diverticular, recurrent in spite of stopping Eliquis , painless and benign exam. GI has seen and signed off once melena had improved, but now is recurring. CTA abdomen did not show active bleeding. Did show two liver lesions.  - Three bloody bowel movements overnight - Trend H&H - Continue to hold Eliquis  - Would recommend re-involving GI, may benefit from scope.   C. difficile colitis  -continue oral vancomycin   Acute hypoxic respiratory failure -unclear cause, clear chest x-ray. CT without parenchymal disease. Trace pleural effusions, certainly nothing that could be drained safely. Now down to 2L and could likely discontinue.  - Supportive care - Keep sats > 95% - PCCM will sign off  HFrEF -further drop in EF to 20 to 25% - Resume Entresto, Coreg  if he remains hemodynamically stable - Cardiology consult at some point  Paroxysmal atrial fibrillation-currently rate controlled in sinus rhythm - Eliquis  on hold  Liver lesions noted on CT 5/7 - MRI recommended. Per Primary  PCCM will sign off, please re-consult if  critical care services are needed.    Best Practice (right click and "Reselect all SmartList Selections" daily)   Diet/type: NPO DVT prophylaxis SCD GI prophylaxis: PPI Lines: N/A Foley:  N/A Code Status:  full code Last date of multidisciplinary goals of care discussion [NA]  Critical care time:      Roz Cornelia, AGACNP-BC [facility]  See Amion for  personal pager PCCM on call pager [phone] until 7pm. Please call Elink 7p-7a. [phone]  11/06/2023 11:40 AM

## 2023-11-06 NOTE — Sepsis Progress Note (Signed)
 Notified bedside nurse of need to draw repeat lactic acid.
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
## 2023-11-06 NOTE — Progress Notes (Signed)
 Corey Meyer 2:34 PM  Subjective: Reasked to see patient by hospital team and case discussed with them as his nurse and he has not had a bowel movement or any further bleeding today did have some bright red blood last night and today he just wants to eat has no complaints no pain  Objective: Vital signs stable afebrile no acute distress abdomen is soft nontender BUN normal 90 normal hemoglobin with minimal drop and the same as it was yesterday a.m.  Assessment: C. difficile with seemingly self-limited blood in stool with some blood thinner at home  Plan: Will allow clear liquids if no signs of further bleeding can advance diet tomorrow okay to hold off on colonoscopy or flex sig at this time will check on tomorrow  [facility]  office [phone] After 5PM or if no answer call [phone]

## 2023-11-07 ENCOUNTER — Other Ambulatory Visit (HOSPITAL_COMMUNITY): Payer: Self-pay

## 2023-11-07 LAB — CBC WITH DIFFERENTIAL/PLATELET
Abs Immature Granulocytes: 0.07 10*3/uL (ref 0.00–0.07)
Basophils Absolute: 0 10*3/uL (ref 0.0–0.1)
Basophils Relative: 0 %
Eosinophils Absolute: 0.1 10*3/uL (ref 0.0–0.5)
Eosinophils Relative: 1 %
HCT: 36.7 % — ABNORMAL LOW (ref 39.0–52.0)
Hemoglobin: 12 g/dL — ABNORMAL LOW (ref 13.0–17.0)
Immature Granulocytes: 1 %
Lymphocytes Relative: 17 %
Lymphs Abs: 1.3 10*3/uL (ref 0.7–4.0)
MCH: 33.1 pg (ref 26.0–34.0)
MCHC: 32.7 g/dL (ref 30.0–36.0)
MCV: 101.4 fL — ABNORMAL HIGH (ref 80.0–100.0)
Monocytes Absolute: 0.6 10*3/uL (ref 0.1–1.0)
Monocytes Relative: 8 %
Neutro Abs: 5.4 10*3/uL (ref 1.7–7.7)
Neutrophils Relative %: 73 %
Platelets: 185 10*3/uL (ref 150–400)
RBC: 3.62 MIL/uL — ABNORMAL LOW (ref 4.22–5.81)
RDW: 18.7 % — ABNORMAL HIGH (ref 11.5–15.5)
WBC: 7.4 10*3/uL (ref 4.0–10.5)
nRBC: 0.3 % — ABNORMAL HIGH (ref 0.0–0.2)

## 2023-11-07 LAB — BASIC METABOLIC PANEL WITH GFR
Anion gap: 12 (ref 5–15)
BUN: 7 mg/dL — ABNORMAL LOW (ref 8–23)
CO2: 20 mmol/L — ABNORMAL LOW (ref 22–32)
Calcium: 9 mg/dL (ref 8.9–10.3)
Chloride: 100 mmol/L (ref 98–111)
Creatinine, Ser: 0.86 mg/dL (ref 0.61–1.24)
GFR, Estimated: >60 mL/min (ref >60)
Glucose, Bld: 91 mg/dL (ref 70–99)
Potassium: 4 mmol/L (ref 3.5–5.1)
Sodium: 132 mmol/L — ABNORMAL LOW (ref 135–145)

## 2023-11-07 LAB — GLUCOSE, CAPILLARY
Glucose-Capillary: 113 mg/dL — ABNORMAL HIGH (ref 70–99)
Glucose-Capillary: 134 mg/dL — ABNORMAL HIGH (ref 70–99)
Glucose-Capillary: 156 mg/dL — ABNORMAL HIGH (ref 70–99)
Glucose-Capillary: 103 mg/dL — ABNORMAL HIGH (ref 70–99)

## 2023-11-07 LAB — LIPID PANEL
Cholesterol: 93 mg/dL (ref 0–200)
HDL: 31 mg/dL — ABNORMAL LOW (ref >40)
LDL Cholesterol: 45 mg/dL (ref 0–99)
Total CHOL/HDL Ratio: 3 ratio
Triglycerides: 85 mg/dL (ref <150)
VLDL: 17 mg/dL (ref 0–40)

## 2023-11-07 NOTE — Progress Notes (Addendum)
 Progress Note   Patient: Corey Meyer ONG:[id number] DOB: Oct 12, 1950 DOA: 11/03/2023     1 DOS: the patient was seen and examined on 11/07/2023   Brief hospital course: 72yo with h/o DM2 with neuropathy, HTN, HLD, BPH, tobacco use d/o, polysubstance use, afib, and chronic systolic heart failure who presented on 5/5 with bloody stools.  GI consulted, recommends conservative treatment including holding Eliquis .  C diff positive, started on PO Vanc. GI signed off.  Transferred to med surrg but RRT called due to afib and hypoxic respiratory failure so transferred back to ICU.  Assessment and Plan:  C difficile colitis with GI bleeding Patient presenting with concern for GI bleeding (unrelated to Eliquis , as he is noncompliant with this medication) Bleeding continued intermittently with persistent diarrhea earlier in hospitalization, likely related to C diff colitis Diarrhea and bleeding currently appears to be improved vs. resolved C diff POSITIVE Admitted to progressive, transferred to med surg but then developed new onset afib with concern for sepsis (ruled out) and transferred to ICU on 5/7 Stable for transfer to telemetry on 5/9 Advancing diet Started oral vancomycin  per protocol, also now on IV metronidazole  Started probiotics GI consulted, has signed off  Good hand-washing (rather than use of alcohol gel) will be critical to decrease the risk of spread.  Given prior hx of gastritis will continue PPI   Acute hypoxic respiratory failure In the setting of new-onset afib O2 sats in the 70s, started on NRB O2 CXR/CTA negative Weaned back off O2 and appears better now  Group B Strep bacteriuria No obvious symptoms Discussed with ID pharmacy, treatment not recommended   Chronic systolic CHF (congestive heart failure)  Last echo over 3 years ago with EF 40-45% Current echo with EF 20-25% Medication compliance must be encouraged Low sodium diet advised Resume GDMT management at  discharge Cardiology consulted Patient was started on Digoxin  for inotropic and chronotropic benefit   Nutrition Status: Nutrition Problem: Moderate Malnutrition Etiology: chronic illness (alcohol abuse; CHF) Signs/Symptoms: moderate fat depletion, mild muscle depletion, percent weight loss (23% within the past 10 months) Percent weight loss: 23 % (within the past 10 months) Interventions: Refer to RD note for recommendations   Non compliance w medication regimen Education and importance with compliance has been provided Patient will need prescriptions at discharge to help him restart regimen until follow up with PCP   Atrial fibrillation, chronic Non-compliant with eliquis  or rate control agents Appears paroxysmal in nature Carvedilol  resumed Digoxin  added Continue to hold Eliquis  in the setting of GI bleed   Type 2 diabetes with nephropathy  A1c is 5.8, good control despite medication noncompliance SSI has been ordered   Alcohol use Active withdrawal does not appear to be currently present On CIWA protocol MVI, thiamine , and folic acid  ordered Cessation counseling provided Normal B12 level   BPH (benign prostatic hyperplasia) Resume flomax  (one of the few meds that he takes the most) No retention complaints currently reported   Hypomagnesemia/hypokalemia Associated with alcohol abuse most likely Will replete electrolytes and follow trend   Tobacco/recreational drug use Cessation counseling provided Nicotine  patch provided UDS ordered, not done TOC has been asked to provide outpatient resources to help him quitting         Consultants: GI Cardiology   Procedures: None   Antibiotics: PO Vancomycin  5/7-17 Metronidazole  5/7-14 IV Vancomycin  x 1 dose Cefepime  x 1 dose   30 Day Unplanned Readmission Risk Score    Flowsheet Row ED to Hosp-Admission (Current) from 11/03/2023  in [facility]ICU/STEPDOWN  30 Day Unplanned Readmission Risk  Score (%) 18.25 Filed at 11/07/2023 0401       This score is the patient's risk of an unplanned readmission within 30 days of being discharged (0 -100%). The score is based on dignosis, age, lab data, medications, orders, and past utilization.   Low:  0-14.9   Medium: 15-21.9   High: 22-29.9   Extreme: 30 and above           Subjective: Feeling much better, looks much healthier.  Wants to eat!   Objective: Vitals:   11/07/23 1300 11/07/23 1400  BP: 106/82 110/89  Pulse:    Resp: 20 (!) 23  Temp:    SpO2:      Intake/Output Summary (Last 24 hours) at 11/07/2023 1631 Last data filed at 11/07/2023 1407 Gross per 24 hour  Intake 200 ml  Output 975 ml  Net -775 ml   Filed Weights   11/05/23 1927 11/06/23 0439 11/06/23 2300  Weight: 70.5 kg 73.4 kg 73.4 kg    Exam:  General:  Appears calm and comfortable and is in NAD, looks much healthier today Eyes:   normal lids, iris ENT:  grossly normal hearing, lips & tongue, mmm Cardiovascular:  RRR. No LE edema.  Respiratory:   CTA bilaterally with no wheezes/rales/rhonchi.  Normal respiratory effort. Abdomen:  soft, NT, ND Skin:  no rash or induration seen on limited exam Musculoskeletal:  grossly normal tone BUE/BLE, good ROM, no bony abnormality Psychiatric:  grossly normal mood and affect, speech fluent and appropriate, AOx3 Neurologic:  CN 2-12 grossly intact, moves all extremities in coordinated fashion   Data Reviewed: I have reviewed the patient's lab results since admission.  Pertinent labs for today include:   Na++ 132, not clinically significant WBC 7.4 Hgb 12 Urine culture from 5.6 with 50k colonies of group A strep  Blood cultures NTD x 12 hours   Family Communication: None present  Disposition: Status is: Inpatient Remains inpatient appropriate because: ongoing monitoring, possible dc 5/10     Time spent: 50 minutes  Unresulted Labs (From admission, onward)     Start     Ordered   11/08/23 0500   CBC with Differential/Platelet  Tomorrow morning,   R       Question:  Specimen collection method  Answer:  Lab=Lab collect   11/07/23 1631   11/08/23 0500  Basic metabolic panel with GFR  Tomorrow morning,   R       Question:  Specimen collection method  Answer:  Lab=Lab collect   11/07/23 1631   11/05/23 1941  Respiratory (~20 pathogens) panel by PCR  (Respiratory panel by PCR (~20 pathogens, ~24 hr TAT)  w precautions)  ONCE - URGENT,   URGENT        11/05/23 1941             Author: Lorita Rosa, MD 11/07/2023 4:31 PM  For on call review [email or web address].

## 2023-11-07 NOTE — Plan of Care (Signed)
  Problem: Education: Goal: Knowledge of General Education information will improve Description: Including pain rating scale, medication(s)/side effects and non-pharmacologic comfort measures Outcome: Progressing   Problem: Health Behavior/Discharge Planning: Goal: Ability to manage health-related needs will improve Outcome: Progressing   Problem: Nutrition: Goal: Adequate nutrition will be maintained Outcome: Progressing   Problem: Coping: Goal: Level of anxiety will decrease Outcome: Progressing   Problem: Activity: Goal: Risk for activity intolerance will decrease Outcome: Not Progressing   Problem: Clinical Measurements: Goal: Respiratory complications will improve Outcome: Adequate for Discharge Goal: Cardiovascular complication will be avoided Outcome: Adequate for Discharge

## 2023-11-07 NOTE — Consult Note (Signed)
 Ref: [facility]   Subjective:  Awake. Asking for food. Discussed diet, activity and medications as well as alcohol and cigarette smoking cessation.  Objective:  Vital Signs in the last 24 hours: Temp:  [98.2 F (36.8 C)-98.6 F (37 C)] 98.6 F (37 C) (05/09 0740) Pulse Rate:  [79-92] 92 (05/09 0855) Cardiac Rhythm: Normal sinus rhythm (05/09 0740) Resp:  [13-39] 22 (05/09 0855) BP: (107-162)/(48-94) 113/76 (05/09 0855) SpO2:  [100 %] 100 % (05/09 0855) Weight:  [73.4 kg] 73.4 kg (05/08 2300)  Physical Exam: BP Readings from Last 1 Encounters:  11/07/23 113/76     Wt Readings from Last 1 Encounters:  11/06/23 73.4 kg    Weight change: 2.9 kg Body mass index is 23.9 kg/m. HEENT: [place]/AT, Eyes-Brown, PERL, EOMI, Conjunctiva-Pink, Sclera-Non-icteric Neck: No JVD, No bruit, Trachea midline. Lungs:  Clear, Bilateral. Cardiac:  Regular rhythm, normal S1 and S2, no S3. II/VI systolic murmur. Abdomen:  Soft, non-tender. BS present. Extremities:  No edema present. No cyanosis. No clubbing. CNS: AxOx3, Cranial nerves grossly intact, moves all 4 extremities.  Skin: Warm and dry.   Intake/Output from previous day: 05/08 0701 - 05/09 0700 In: 880 [I.V.:530; IV Piggyback:350] Out: 525 [Urine:525]    Lab Results: BMET    Component Value Date/Time   NA 132 (L) 11/07/2023 0333   NA 133 (L) 11/06/2023 0332   NA 135 11/05/2023 1929   NA 138 02/08/2019 1047   NA 137 01/28/2019 0910   K 4.0 11/07/2023 0333   K 4.4 11/06/2023 0332   K 4.3 11/05/2023 1929   CL 100 11/07/2023 0333   CL 100 11/06/2023 0332   CL 100 11/05/2023 1929   CO2 20 (L) 11/07/2023 0333   CO2 22 11/06/2023 0332   CO2 19 (L) 11/05/2023 1929   GLUCOSE 91 11/07/2023 0333   GLUCOSE 119 (H) 11/06/2023 0332   GLUCOSE 152 (H) 11/05/2023 1929   BUN 7 (L) 11/07/2023 0333   BUN 10 11/06/2023 0332   BUN 12 11/05/2023 1929   BUN 36 (H) 02/08/2019 1047   BUN 29 (H) 01/28/2019 0910   CREATININE 0.86  11/07/2023 0333   CREATININE 0.99 11/06/2023 0332   CREATININE 0.91 11/05/2023 1929   CALCIUM  9.0 11/07/2023 0333   CALCIUM  8.5 (L) 11/06/2023 0332   CALCIUM  8.7 (L) 11/05/2023 1929   GFRNONAA >60 11/07/2023 0333   GFRNONAA >60 11/06/2023 0332   GFRNONAA >60 11/05/2023 1929   GFRAA 33 (L) 02/08/2019 1047   GFRAA 39 (L) 01/28/2019 0910   GFRAA 54 (L) 01/06/2019 0347   CBC    Component Value Date/Time   WBC 7.4 11/07/2023 0333   RBC 3.62 (L) 11/07/2023 0333   HGB 12.0 (L) 11/07/2023 0333   HCT 36.7 (L) 11/07/2023 0333   PLT 185 11/07/2023 0333   MCV 101.4 (H) 11/07/2023 0333   MCH 33.1 11/07/2023 0333   MCHC 32.7 11/07/2023 0333   RDW 18.7 (H) 11/07/2023 0333   LYMPHSABS 1.3 11/07/2023 0333   MONOABS 0.6 11/07/2023 0333   EOSABS 0.1 11/07/2023 0333   BASOSABS 0.0 11/07/2023 0333   HEPATIC Function Panel Recent Labs    11/04/23 0400 11/05/23 1929 11/06/23 0332  PROT 6.8 5.7* 5.2*  ALBUMIN  3.4* 2.7* 2.8*  AST 74* 78* 52*  ALT 29 23 21   ALKPHOS 67 58 44   HEMOGLOBIN A1C Lab Results  Component Value Date   MPG 119.76 11/04/2023   CARDIAC ENZYMES No results found for: "CKTOTAL", "CKMB", "CKMBINDEX", "  TROPONINI" BNP No results for input(s): "PROBNP" in the last 8760 hours. TSH No results for input(s): "TSH" in the last 8760 hours. CHOLESTEROL No results for input(s): "CHOL" in the last 8760 hours.  Scheduled Meds:  carvedilol   6.25 mg Oral BID WC   Chlorhexidine  Gluconate Cloth  6 each Topical Q2200   digoxin   0.0625 mg Oral Daily   feeding supplement  1 Container Oral TID BM   folic acid   1 mg Intravenous Daily   insulin  aspart  0-9 Units Subcutaneous TID WC   multivitamin with minerals  1 tablet Oral Daily   nicotine   21 mg Transdermal Daily   pantoprazole  (PROTONIX ) IV  40 mg Intravenous Q12H   saccharomyces boulardii  250 mg Oral BID   simvastatin   20 mg Oral q1800   tamsulosin   0.4 mg Oral QHS   thiamine  (VITAMIN B1) injection  100 mg Intravenous  Daily   vancomycin   125 mg Oral QID   Continuous Infusions:  metronidazole  500 mg (11/07/23 1121)   PRN Meds:.acetaminophen  **OR** acetaminophen , hydrALAZINE , levalbuterol , ondansetron  (ZOFRAN ) IV, mouth rinse, oxyCODONE   Assessment/Plan: Acute on chronic systolic left heart failure, HFrEF Paroxysmal atrial fibrillation, [id number] score of 4 Recent acute GI bleed Type 2 DM with nephropathy Tobacco use disorder Polysubstance abuse Medication non-compliance  Plan: Increase activity and diet per primary. Check lipid panel. F/U in 1-2 weeks.   LOS: 1 day   Time spent including chart review, lab review, examination, discussion with patient/Nurse/Doctor : 30 min   Corey Bone  MD  11/07/2023, 12:10 PM

## 2023-11-07 NOTE — Progress Notes (Signed)
 Corey Meyer 10:33 AM  Subjective: Patient doing well without diarrhea or signs of bleeding and wants to eat and has no abdominal pain and no new complaints Case discussed with his nurse as well  Objective: Vital signs stable afebrile no acute distress abdomen is soft nontender BUN and creatinine okay hemoglobin increased white count okay  Assessment: C. difficile with some subacute GI bleeding and patient possibly on some Eliquis  at home and significant alcohol  Plan: Will allow soft diet and since he seems to be responded quickly to the [place] no further recommendations please call us  back if we can be of any further assistance with this hospital stay and will need to discuss decreased alcohol going forward  [facility]  office [phone] After 5PM or if no answer call [phone]

## 2023-11-08 ENCOUNTER — Other Ambulatory Visit (HOSPITAL_COMMUNITY): Payer: Self-pay

## 2023-11-08 LAB — CBC WITH DIFFERENTIAL/PLATELET
Abs Immature Granulocytes: 0.1 10*3/uL — ABNORMAL HIGH (ref 0.00–0.07)
Basophils Absolute: 0 10*3/uL (ref 0.0–0.1)
Basophils Relative: 0 %
Eosinophils Absolute: 0.1 10*3/uL (ref 0.0–0.5)
Eosinophils Relative: 1 %
HCT: 31.3 % — ABNORMAL LOW (ref 39.0–52.0)
Hemoglobin: 10.3 g/dL — ABNORMAL LOW (ref 13.0–17.0)
Immature Granulocytes: 1 %
Lymphocytes Relative: 14 %
Lymphs Abs: 1.3 10*3/uL (ref 0.7–4.0)
MCH: 32.9 pg (ref 26.0–34.0)
MCHC: 32.9 g/dL (ref 30.0–36.0)
MCV: 100 fL (ref 80.0–100.0)
Monocytes Absolute: 1.1 10*3/uL — ABNORMAL HIGH (ref 0.1–1.0)
Monocytes Relative: 11 %
Neutro Abs: 7 10*3/uL (ref 1.7–7.7)
Neutrophils Relative %: 73 %
Platelets: 183 10*3/uL (ref 150–400)
RBC: 3.13 MIL/uL — ABNORMAL LOW (ref 4.22–5.81)
RDW: 18.8 % — ABNORMAL HIGH (ref 11.5–15.5)
WBC: 9.6 10*3/uL (ref 4.0–10.5)
nRBC: 0 % (ref 0.0–0.2)

## 2023-11-08 LAB — BASIC METABOLIC PANEL WITH GFR
Anion gap: 9 (ref 5–15)
BUN: 6 mg/dL — ABNORMAL LOW (ref 8–23)
CO2: 22 mmol/L (ref 22–32)
Calcium: 8.7 mg/dL — ABNORMAL LOW (ref 8.9–10.3)
Chloride: 104 mmol/L (ref 98–111)
Creatinine, Ser: 0.77 mg/dL (ref 0.61–1.24)
GFR, Estimated: >60 mL/min (ref >60)
Glucose, Bld: 126 mg/dL — ABNORMAL HIGH (ref 70–99)
Potassium: 4 mmol/L (ref 3.5–5.1)
Sodium: 135 mmol/L (ref 135–145)

## 2023-11-08 LAB — GLUCOSE, CAPILLARY
Glucose-Capillary: 130 mg/dL — ABNORMAL HIGH (ref 70–99)
Glucose-Capillary: 210 mg/dL — ABNORMAL HIGH (ref 70–99)
Glucose-Capillary: 120 mg/dL — ABNORMAL HIGH (ref 70–99)

## 2023-11-08 MED ORDER — VANCOMYCIN HCL 125 MG PO CAPS
125.0000 mg | ORAL_CAPSULE | Freq: Four times a day (QID) | ORAL | 0 refills | Status: AC
  Filled 2023-11-08: qty 12, 3d supply, fill #0

## 2023-11-08 MED ORDER — TORSEMIDE 10 MG PO TABS: 10.0000 mg | ORAL_TABLET | ORAL | Status: DC

## 2023-11-08 MED ORDER — DIGOXIN 125 MCG PO TABS
0.1250 mg | ORAL_TABLET | ORAL | Status: DC
  Administered 2023-11-08: 0.125 mg via ORAL
  Filled 2023-11-08: qty 1

## 2023-11-08 MED ORDER — SACCHAROMYCES BOULARDII 250 MG PO CAPS
250.0000 mg | ORAL_CAPSULE | Freq: Two times a day (BID) | ORAL | 0 refills | Status: AC
  Filled 2023-11-08: qty 60, 30d supply, fill #0

## 2023-11-08 MED ORDER — TORSEMIDE 10 MG PO TABS: 10.0000 mg | ORAL_TABLET | ORAL | 0 refills | Status: DC

## 2023-11-08 MED ORDER — DIGOXIN 125 MCG PO TABS: 0.1250 mg | ORAL_TABLET | ORAL | 0 refills | Status: DC

## 2023-11-08 MED ORDER — POTASSIUM CHLORIDE ER 10 MEQ PO TBCR: 10.0000 meq | EXTENDED_RELEASE_TABLET | ORAL | 0 refills | Status: DC

## 2023-11-08 MED ORDER — DIGOXIN 125 MCG PO TABS
0.1250 mg | ORAL_TABLET | ORAL | 0 refills | Status: AC
  Filled 2023-11-08: qty 12, 28d supply, fill #0

## 2023-11-08 MED ORDER — METRONIDAZOLE 500 MG PO TABS: 500.0000 mg | ORAL_TABLET | Freq: Three times a day (TID) | ORAL | 0 refills | Status: DC

## 2023-11-08 MED ORDER — TAMSULOSIN HCL 0.4 MG PO CAPS: 0.4000 mg | ORAL_CAPSULE | Freq: Every day | ORAL | 0 refills | Status: DC

## 2023-11-08 MED ORDER — CARVEDILOL 6.25 MG PO TABS
6.2500 mg | ORAL_TABLET | Freq: Two times a day (BID) | ORAL | 0 refills | Status: AC
  Filled 2023-11-08: qty 60, 30d supply, fill #0

## 2023-11-08 MED ORDER — VANCOMYCIN HCL 125 MG PO CAPS: 125.0000 mg | ORAL_CAPSULE | Freq: Four times a day (QID) | ORAL | 0 refills | Status: DC

## 2023-11-08 MED ORDER — LOSARTAN POTASSIUM 25 MG PO TABS: 25.0000 mg | ORAL_TABLET | Freq: Every day | ORAL | 0 refills | Status: DC

## 2023-11-08 MED ORDER — SACCHAROMYCES BOULARDII 250 MG PO CAPS: 250.0000 mg | ORAL_CAPSULE | Freq: Two times a day (BID) | ORAL | 0 refills | Status: DC

## 2023-11-08 MED ORDER — LOSARTAN POTASSIUM 25 MG PO TABS
25.0000 mg | ORAL_TABLET | Freq: Every day | ORAL | 0 refills | Status: AC
  Filled 2023-11-08: qty 30, 30d supply, fill #0

## 2023-11-08 MED ORDER — POTASSIUM CHLORIDE ER 10 MEQ PO TBCR: 10.0000 meq | EXTENDED_RELEASE_TABLET | ORAL | Status: DC

## 2023-11-08 MED ORDER — TAMSULOSIN HCL 0.4 MG PO CAPS
0.4000 mg | ORAL_CAPSULE | Freq: Every day | ORAL | 0 refills | Status: AC
  Filled 2023-11-08: qty 30, 30d supply, fill #0

## 2023-11-08 MED ORDER — TORSEMIDE 20 MG PO TABS
10.0000 mg | ORAL_TABLET | ORAL | 0 refills | Status: AC
  Filled 2023-11-08: qty 6, 28d supply, fill #0

## 2023-11-08 MED ORDER — CARVEDILOL 6.25 MG PO TABS: 6.2500 mg | ORAL_TABLET | Freq: Two times a day (BID) | ORAL | 0 refills | Status: DC

## 2023-11-08 MED ORDER — POTASSIUM CHLORIDE ER 10 MEQ PO TBCR
10.0000 meq | EXTENDED_RELEASE_TABLET | ORAL | 0 refills | Status: AC
Start: 2023-11-10 — End: ?
  Filled 2023-11-08: qty 12, 28d supply, fill #0

## 2023-11-08 MED ORDER — LOSARTAN POTASSIUM 50 MG PO TABS
25.0000 mg | ORAL_TABLET | Freq: Every day | ORAL | Status: DC
  Administered 2023-11-08: 25 mg via ORAL
  Filled 2023-11-08: qty 1

## 2023-11-08 MED ORDER — METRONIDAZOLE 500 MG PO TABS
500.0000 mg | ORAL_TABLET | Freq: Three times a day (TID) | ORAL | 0 refills | Status: AC
  Filled 2023-11-08: qty 12, 4d supply, fill #0

## 2023-11-08 MED ORDER — NICOTINE 21 MG/24HR TD PT24
21.0000 mg | MEDICATED_PATCH | Freq: Every day | TRANSDERMAL | 0 refills | Status: AC
  Filled 2023-11-08: qty 28, 28d supply, fill #0

## 2023-11-08 MED ORDER — NICOTINE 21 MG/24HR TD PT24: 21.0000 mg | MEDICATED_PATCH | Freq: Every day | TRANSDERMAL | 0 refills | Status: DC

## 2023-11-08 NOTE — Discharge Summary (Addendum)
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 [place] [address]    Culture (A)  Final    50,000 COLONIES/mL GROUP B STREP(S.AGALACTIAE)ISOLATED TESTING AGAINST S. AGALACTIAE NOT ROUTINELY PERFORMED DUE TO PREDICTABILITY OF AMP/PEN/VAN SUSCEPTIBILITY. Performed at [facility], [address], [place], [place] [address]    Report Status 11/06/2023 FINAL  Final  Culture, blood (Routine X 2) w Reflex to ID Panel     Status: None (Preliminary result)   Collection Time: 11/05/23  9:58 PM   Specimen: BLOOD LEFT ARM  Result Value Ref Range Status   Specimen Description   Final    BLOOD LEFT ARM Performed at [facility], [address] [place], [place] [address]    Special Requests   Final    BOTTLES DRAWN AEROBIC AND ANAEROBIC Blood Culture results may not be optimal due to an inadequate volume of blood received in culture bottles Performed at [facility], [address] [place], [place] [address]     Culture   Final    NO GROWTH 2 DAYS Performed at [facility], [address], [place], [place] [address]    Report Status PENDING  Incomplete  Culture, blood (Routine X 2) w Reflex to ID Panel     Status: None (Preliminary result)   Collection Time: 11/05/23  9:58 PM   Specimen: BLOOD LEFT HAND  Result Value Ref Range Status   Specimen Description   Final    BLOOD LEFT HAND Performed at [facility], [address] [place], [place] [address]    Special Requests   Final    BOTTLES DRAWN AEROBIC AND ANAEROBIC Blood Culture results may not be optimal due to an inadequate volume of blood received in culture bottles Performed at [facility], [address] [place], [place] [address]    Culture   Final    NO GROWTH 2 DAYS Performed at [facility], [address] [place], [place] [address]    Report Status PENDING  Incomplete    Labs: CBC: Recent Labs  Lab 11/04/23 0003 11/04/23 0400 11/04/23 1847 11/05/23 9518 11/05/23 2053 11/06/23 0332 11/06/23 0807 11/07/23 0333 11/08/23 0834  WBC 10.3 10.6*   < > 7.6 6.4 7.2  --  7.4 9.6  NEUTROABS 8.3* 9.1*  --   --   --  4.7  --  5.4 7.0  HGB 8.2* 8.3*   < > 10.2* 11.3* 9.7* 10.5* 12.0* 10.3*  HCT 22.9* 24.3*   < > 29.8* 32.6* 29.0* 32.3* 36.7* 31.3*  MCV 102.7* 103.4*   < > 97.1 94.8 99.0  --  101.4* 100.0  PLT 195 198   < > 157 149* 137*  --  185 183   < > = values in this interval not displayed.   Basic Metabolic Panel: Recent Labs  Lab 11/04/23 0400 11/05/23 0635 11/05/23 1929 11/05/23 2053 11/06/23 0332 11/06/23 0806 11/07/23 0333 11/08/23 0834  NA 137 131* 135  --  133*  --  132* 135  K 3.2* 3.3* 4.3  --  4.4  --  4.0 4.0  CL 98 98 100  --  100  --  100 104  CO2 19* 21* 19*  --  22  --  20* 22  GLUCOSE 149* 96 152*  --  119*  --  91 126*  BUN 15 13 12   --  10  --  7* 6*  CREATININE 0.88 0.86 0.91  --  0.99  --  0.86 0.77  CALCIUM  8.8* 8.3* 8.7*  --  8.5*  --  9.0 8.7*   MG 1.8 1.6*  --  1.4*  --  2.1  --   --    Liver Function Tests: Recent Labs  Lab 11/04/23 0003 11/04/23 0400 11/05/23 1929 11/06/23 0332  AST 77* 74* 78* 52*  ALT 26 29 23 21   ALKPHOS 65 67 58 44  BILITOT 1.4* 1.3* 1.4* 1.0  PROT 6.3* 6.8 5.7* 5.2*  ALBUMIN  3.1* 3.4* 2.7* 2.8*   CBG: Recent Labs  Lab 11/07/23 1121 11/07/23 1609 11/07/23 2153 11/08/23 0730 11/08/23 1209  GLUCAP 134* 156* 103* 130* 210*    Discharge time spent: greater than 30 minutes.  Signed: Lorita Rosa, MD [facility] 11/08/2023

## 2023-11-08 NOTE — TOC Transition Note (Signed)
 Transition of Care [facility]) - Discharge Note   Patient Details  Name: Corey Meyer MRN: [id number] Date of Birth: [birth year]  [facility] CM/SW Contact:  Levie Ream, RN Phone Number: 11/08/2023, 1:46 PM   Clinical Narrative:    D/C orders received; no TOC needs.   Final next level of care: Home/Self Care Barriers to Discharge: No Barriers Identified   Patient Goals and CMS Choice Patient states their goals for this hospitalization and ongoing recovery are:: return home          Discharge Placement                       Discharge Plan and Services Additional resources added to the After Visit Summary for   In-house Referral: NA              DME Arranged: N/A DME Agency: NA       HH Arranged: NA HH Agency: NA        Social Drivers of Health (SDOH) Interventions SDOH Screenings   Food Insecurity: No Food Insecurity (11/04/2023)  Housing: Low Risk  (11/04/2023)  Transportation Needs: No Transportation Needs (11/04/2023)  Utilities: Not At Risk (11/04/2023)  Physical Activity: Unknown (12/25/2018)  Social Connections: Moderately Isolated (11/04/2023)  Stress: No Stress Concern Present (12/25/2018)  Tobacco Use: High Risk (11/03/2023)     Readmission Risk Interventions    03/11/2022   11:58 AM  Readmission Risk Prevention Plan  PCP or Specialist Appt within 3-5 Days Complete  HRI or Home Care Consult Complete  Social Work Consult for Recovery Care Planning/Counseling Complete  Palliative Care Screening Not Applicable  Medication Review Oceanographer) Complete

## 2023-11-08 NOTE — Consult Note (Signed)
 Ref: Clinic, [facility]   Subjective:  Awake. VS stable. Discussed diet, activity and medications but patient does not appear motivated to quit smoking and alcohol intake. Very good lipid panel with LDL of 45 mg. HDL has dropped from 73 mg. To 31 mg.   Objective:  Vital Signs in the last 24 hours: Temp:  [98.2 F (36.8 C)-99 F (37.2 C)] 98.2 F (36.8 C) (05/10 0154) Pulse Rate:  [85-111] 90 (05/10 0154) Cardiac Rhythm: Normal sinus rhythm (05/10 0700) Resp:  [16-31] 18 (05/10 0154) BP: (90-115)/(75-89) 101/77 (05/10 0154) SpO2:  [99 %-100 %] 100 % (05/10 0154)  Physical Exam: BP Readings from Last 1 Encounters:  11/08/23 101/77     Wt Readings from Last 1 Encounters:  11/06/23 73.4 kg    Weight change:  Body mass index is 23.9 kg/m. HEENT: [place]/AT, Eyes-Brown, Conjunctiva-Pink, Sclera-Non-icteric Neck: No JVD, No bruit, Trachea midline. Lungs:  Clear, Bilateral. Cardiac:  Regular rhythm, normal S1 and S2, no S3. II/VI systolic murmur. Abdomen:  Soft, non-tender. BS present. Extremities:  No edema present. No cyanosis. No clubbing. CNS: AxOx3, Cranial nerves grossly intact, moves all 4 extremities.  Skin: Warm and dry.   Intake/Output from previous day: 05/09 0701 - 05/10 0700 In: 100 [IV Piggyback:100] Out: 1325 [Urine:1325]    Lab Results: BMET    Component Value Date/Time   NA 135 11/08/2023 0834   NA 132 (L) 11/07/2023 0333   NA 133 (L) 11/06/2023 0332   NA 138 02/08/2019 1047   NA 137 01/28/2019 0910   K 4.0 11/08/2023 0834   K 4.0 11/07/2023 0333   K 4.4 11/06/2023 0332   CL 104 11/08/2023 0834   CL 100 11/07/2023 0333   CL 100 11/06/2023 0332   CO2 22 11/08/2023 0834   CO2 20 (L) 11/07/2023 0333   CO2 22 11/06/2023 0332   GLUCOSE 126 (H) 11/08/2023 0834   GLUCOSE 91 11/07/2023 0333   GLUCOSE 119 (H) 11/06/2023 0332   BUN 6 (L) 11/08/2023 0834   BUN 7 (L) 11/07/2023 0333   BUN 10 11/06/2023 0332   BUN 36 (H) 02/08/2019 1047   BUN 29 (H)  01/28/2019 0910   CREATININE 0.77 11/08/2023 0834   CREATININE 0.86 11/07/2023 0333   CREATININE 0.99 11/06/2023 0332   CALCIUM  8.7 (L) 11/08/2023 0834   CALCIUM  9.0 11/07/2023 0333   CALCIUM  8.5 (L) 11/06/2023 0332   GFRNONAA >60 11/08/2023 0834   GFRNONAA >60 11/07/2023 0333   GFRNONAA >60 11/06/2023 0332   GFRAA 33 (L) 02/08/2019 1047   GFRAA 39 (L) 01/28/2019 0910   GFRAA 54 (L) 01/06/2019 0347   CBC    Component Value Date/Time   WBC 9.6 11/08/2023 0834   RBC 3.13 (L) 11/08/2023 0834   HGB 10.3 (L) 11/08/2023 0834   HCT 31.3 (L) 11/08/2023 0834   PLT 183 11/08/2023 0834   MCV 100.0 11/08/2023 0834   MCH 32.9 11/08/2023 0834   MCHC 32.9 11/08/2023 0834   RDW 18.8 (H) 11/08/2023 0834   LYMPHSABS 1.3 11/08/2023 0834   MONOABS 1.1 (H) 11/08/2023 0834   EOSABS 0.1 11/08/2023 0834   BASOSABS 0.0 11/08/2023 0834   HEPATIC Function Panel Recent Labs    11/04/23 0400 11/05/23 1929 11/06/23 0332  PROT 6.8 5.7* 5.2*  ALBUMIN  3.4* 2.7* 2.8*  AST 74* 78* 52*  ALT 29 23 21   ALKPHOS 67 58 44   HEMOGLOBIN A1C Lab Results  Component Value Date   MPG 119.76 11/04/2023  CARDIAC ENZYMES No results found for: "CKTOTAL", "CKMB", "CKMBINDEX", "TROPONINI" BNP No results for input(s): "PROBNP" in the last 8760 hours. TSH No results for input(s): "TSH" in the last 8760 hours. CHOLESTEROL Recent Labs    11/07/23 1340  CHOL 93    Scheduled Meds:  carvedilol   6.25 mg Oral BID WC   Chlorhexidine  Gluconate Cloth  6 each Topical Q2200   digoxin   0.125 mg Oral Q M,W,F   feeding supplement  1 Container Oral TID BM   folic acid   1 mg Intravenous Daily   insulin  aspart  0-9 Units Subcutaneous TID WC   multivitamin with minerals  1 tablet Oral Daily   nicotine   21 mg Transdermal Daily   pantoprazole  (PROTONIX ) IV  40 mg Intravenous Q12H   [START ON 11/10/2023] potassium chloride   10 mEq Oral Q M,W,F   saccharomyces boulardii  250 mg Oral BID   simvastatin   20 mg Oral q1800    tamsulosin   0.4 mg Oral QHS   thiamine  (VITAMIN B1) injection  100 mg Intravenous Daily   [START ON 11/10/2023] torsemide  10 mg Oral Q M,W,F   vancomycin   125 mg Oral QID   Continuous Infusions:  metronidazole  500 mg (11/07/23 2235)   PRN Meds:.acetaminophen  **OR** acetaminophen , hydrALAZINE , levalbuterol , ondansetron  (ZOFRAN ) IV, mouth rinse, oxyCODONE   Assessment/Plan: Acute on chronic systolic left heart failure, HFrEF Paroxysmal atrial fibrillation, CHA2DS2VASc score of 4 Recent acute GI bleed Type 2 DM with nephropathy Dyslipidemia Tobacco use disorder Polysubstance abuse Liver cirrhosis Medication non-compliance Anemia of blood loss  Plan: Change Digoxin  to M-W-F. Add Torsemide to 10 mg. M-W-F as needed. Add small dose Losartan 25 mg. Daily. If BP remains stable consider Entresto  small dose on follow up. Increase activity. Patient reminded again to quit smoking and alcohol use   LOS: 2 days   Time spent including chart review, lab review, examination, discussion with patient/Nurse : 30 min   Pasqual Bone  MD  11/08/2023, 12:41 PM

## 2023-11-08 NOTE — Plan of Care (Signed)
  Problem: Elimination: Goal: Will not experience complications related to bowel motility Outcome: Progressing   Problem: Skin Integrity: Goal: Risk for impaired skin integrity will decrease Outcome: Progressing   Problem: Metabolic: Goal: Ability to maintain appropriate glucose levels will improve Outcome: Progressing

## 2023-11-09 LAB — TYPE AND SCREEN
ABO/RH(D): A POS
Antibody Screen: NEGATIVE
Unit division: 0
Unit division: 0

## 2023-11-09 LAB — BPAM RBC
Blood Product Expiration Date: 202506042359
Blood Product Expiration Date: 202506042359
Unit Type and Rh: 6200
Unit Type and Rh: 6200

## 2023-11-10 ENCOUNTER — Other Ambulatory Visit (HOSPITAL_COMMUNITY): Payer: Self-pay

## 2023-11-11 LAB — CULTURE, BLOOD (ROUTINE X 2)
Culture: NO GROWTH
Culture: NO GROWTH

## 2024-01-10 ENCOUNTER — Emergency Department (HOSPITAL_COMMUNITY)

## 2024-01-10 ENCOUNTER — Encounter (HOSPITAL_COMMUNITY): Payer: Self-pay

## 2024-01-10 ENCOUNTER — Emergency Department (HOSPITAL_COMMUNITY)
Admission: EM | Admit: 2024-01-10 | Discharge: 2024-01-10 | Disposition: A | Discharge status: Home / Self Care | Attending: Emergency Medicine | Admitting: Emergency Medicine

## 2024-01-10 DIAGNOSIS — F10120 Alcohol abuse with intoxication, uncomplicated: Secondary | ICD-10-CM | POA: Insufficient documentation

## 2024-01-10 DIAGNOSIS — Y906 Blood alcohol level of 120-199 mg/100 ml: Secondary | ICD-10-CM | POA: Diagnosis not present

## 2024-01-10 DIAGNOSIS — R5383 Other fatigue: Secondary | ICD-10-CM | POA: Diagnosis present

## 2024-01-10 DIAGNOSIS — F10920 Alcohol use, unspecified with intoxication, uncomplicated: Secondary | ICD-10-CM

## 2024-01-10 LAB — CBC WITH DIFFERENTIAL/PLATELET
Abs Immature Granulocytes: 0.07 K/uL (ref 0.00–0.07)
Basophils Absolute: 0 K/uL (ref 0.0–0.1)
Basophils Relative: 1 %
Eosinophils Absolute: 0.1 K/uL (ref 0.0–0.5)
Eosinophils Relative: 1 %
HCT: 35.1 % — ABNORMAL LOW (ref 39.0–52.0)
Hemoglobin: 12 g/dL — ABNORMAL LOW (ref 13.0–17.0)
Immature Granulocytes: 1 %
Lymphocytes Relative: 29 %
Lymphs Abs: 2.2 K/uL (ref 0.7–4.0)
MCH: 33.3 pg (ref 26.0–34.0)
MCHC: 34.2 g/dL (ref 30.0–36.0)
MCV: 97.5 fL (ref 80.0–100.0)
Monocytes Absolute: 0.5 K/uL (ref 0.1–1.0)
Monocytes Relative: 7 %
Neutro Abs: 4.5 K/uL (ref 1.7–7.7)
Neutrophils Relative %: 61 %
Platelets: 194 K/uL (ref 150–400)
RBC: 3.6 MIL/uL — ABNORMAL LOW (ref 4.22–5.81)
RDW: 16.8 % — ABNORMAL HIGH (ref 11.5–15.5)
WBC: 7.4 K/uL (ref 4.0–10.5)
nRBC: 0.5 % — ABNORMAL HIGH (ref 0.0–0.2)

## 2024-01-10 LAB — COMPREHENSIVE METABOLIC PANEL WITH GFR
ALT: 44 U/L (ref 0–44)
AST: 156 U/L — ABNORMAL HIGH (ref 15–41)
Albumin: 3.2 g/dL — ABNORMAL LOW (ref 3.5–5.0)
Alkaline Phosphatase: 120 U/L (ref 38–126)
Anion gap: 18 — ABNORMAL HIGH (ref 5–15)
BUN: 7 mg/dL — ABNORMAL LOW (ref 8–23)
CO2: 27 mmol/L (ref 22–32)
Calcium: 8.9 mg/dL (ref 8.9–10.3)
Chloride: 92 mmol/L — ABNORMAL LOW (ref 98–111)
Creatinine, Ser: 1.05 mg/dL (ref 0.61–1.24)
GFR, Estimated: >60 mL/min (ref >60)
Glucose, Bld: 194 mg/dL — ABNORMAL HIGH (ref 70–99)
Potassium: 3.5 mmol/L (ref 3.5–5.1)
Sodium: 137 mmol/L (ref 135–145)
Total Bilirubin: 1.2 mg/dL (ref 0.0–1.2)
Total Protein: 7.2 g/dL (ref 6.5–8.1)

## 2024-01-10 LAB — RAPID URINE DRUG SCREEN, HOSP PERFORMED
Amphetamines: NOT DETECTED
Barbiturates: NOT DETECTED
Benzodiazepines: NOT DETECTED
Cocaine: NOT DETECTED
Opiates: NOT DETECTED
Tetrahydrocannabinol: NOT DETECTED

## 2024-01-10 LAB — ETHANOL: Alcohol, Ethyl (B): 178 mg/dL — ABNORMAL HIGH (ref <15)

## 2024-01-10 LAB — MAGNESIUM: Magnesium: 1.4 mg/dL — ABNORMAL LOW (ref 1.7–2.4)

## 2024-01-10 MED ORDER — SODIUM CHLORIDE 0.9 % IV BOLUS
500.0000 mL | Freq: Once | INTRAVENOUS | Status: AC
  Administered 2024-01-10: 500 mL via INTRAVENOUS

## 2024-01-10 NOTE — ED Provider Notes (Signed)
 [place] EMERGENCY DEPARTMENT AT [facility] Provider Note   CSN: [id number] Arrival date & time: 01/10/24  1139     Patient presents with: Anorexia and Fatigue   Corey Meyer is a 73 y.o. male.   73 year old male with prior medical history as detailed below presents for evaluation.  Patient arrives from his residence with his roommate as transport.  It is somewhat unclear as to why the patient is presenting to the ED.  Patient reports that he has been lying in bed all week.  He drinks vodka every day.  His last vodka intake was yesterday.  He reports that he has not been eating.  He denies pain.  He denies shortness of breath.  He reports that his roommate had trouble getting me up -however, the patient was able to come here by private vehicle with roommate.  The roommate left the patient in triage.  The history is provided by the patient and medical records.       Prior to Admission medications   Medication Sig Start Date End Date Taking? Authorizing Provider  carvedilol  (COREG ) 6.25 MG tablet Take 1 tablet (6.25 mg total) by mouth 2 (two) times daily. 11/08/23 11/07/24  Barbarann Nest, MD  digoxin  (LANOXIN ) 0.125 MG tablet Take 1 tablet (0.125 mg total) by mouth every Monday, Wednesday, and Friday. 11/10/23   Barbarann Nest, MD  losartan  (COZAAR ) 25 MG tablet Take 1 tablet (25 mg total) by mouth daily. 11/08/23   Barbarann Nest, MD  nicotine  (NICODERM CQ  - DOSED IN MG/24 HOURS) 21 mg/24hr patch Place 1 patch (21 mg total) onto the skin daily. 11/09/23   Barbarann Nest, MD  potassium chloride  (KLOR-CON ) 10 MEQ tablet Take 1 tablet (10 mEq total) by mouth every Monday, Wednesday, and Friday. 11/10/23   Barbarann Nest, MD  saccharomyces boulardii (FLORASTOR) 250 MG capsule Take 1 capsule (250 mg total) by mouth 2 (two) times daily. 11/08/23   Barbarann Nest, MD  tamsulosin  (FLOMAX ) 0.4 MG CAPS capsule Take 1 capsule (0.4 mg total) by mouth at bedtime. 11/08/23   Barbarann Nest, MD  torsemide  (DEMADEX ) 20 MG tablet Take 1/2 tablet (10 mg total) by mouth every Monday, Wednesday, and Friday. 11/10/23   Barbarann Nest, MD    Allergies: Haldol [haloperidol lactate]    Review of Systems  All other systems reviewed and are negative.   Updated Vital Signs BP 96/76 (BP Location: Right Arm)   Pulse (!) 116   Temp 98.9 F (37.2 C) (Oral)   Resp 16   Ht 5' 9 (1.753 m)   Wt 73.4 kg   SpO2 100%   BMI 23.90 kg/m   Physical Exam Vitals and nursing note reviewed.  Constitutional:      General: He is not in acute distress.    Appearance: Normal appearance. He is well-developed.  HENT:     Head: Normocephalic and atraumatic.  Eyes:     Conjunctiva/sclera: Conjunctivae normal.     Pupils: Pupils are equal, round, and reactive to light.  Cardiovascular:     Rate and Rhythm: Normal rate and regular rhythm.     Heart sounds: Normal heart sounds.  Pulmonary:     Effort: Pulmonary effort is normal. No respiratory distress.     Breath sounds: Normal breath sounds.  Abdominal:     General: There is no distension.     Palpations: Abdomen is soft.     Tenderness: There is no abdominal tenderness.  Musculoskeletal:  General: No deformity. Normal range of motion.     Cervical back: Normal range of motion and neck supple.  Skin:    General: Skin is warm and dry.  Neurological:     General: No focal deficit present.     Mental Status: He is alert and oriented to person, place, and time.     (all labs ordered are listed, but only abnormal results are displayed) Labs Reviewed - No data to display  EKG: None  Radiology: No results found.   Procedures   Medications Ordered in the ED - No data to display                                  Medical Decision Making Amount and/or Complexity of Data Reviewed Labs: ordered. Radiology: ordered.    Medical Screen Complete  This patient presented to the ED with complaint of not feeling  well.  This complaint involves an extensive number of treatment options. The initial differential diagnosis includes, but is not limited to, alcohol intoxication, metabolic abnormality  This presentation is: Acute, Chronic, Self-Limited, Previously Undiagnosed, Uncertain Prognosis, Complicated, Systemic Symptoms, and Threat to Life/Bodily Function  Patient presents with nonspecific complaints.  He appears to be clinically intoxicated.  Lab suggests intoxication with an alcohol level of 178.  Other obtained labs are without significant acute abnormality.  After treatment in the ED the patient feels much improved.  He desires discharge home.  He is calling a friend for a ride home.  He is advised to moderate his alcohol consumption.  He agrees with this plan.  Importance of close follow-up is stressed.  Strict return precautions given and understood.  Additional history obtained:  External records from outside sources obtained and reviewed including prior ED visits and prior Inpatient records.    Problem List / ED Course:  Alcohol intoxication     Disposition:  After consideration of the diagnostic results and the patients response to treatment, I feel that the patent would benefit from close outpatient follow-up.       Final diagnoses:  Alcoholic intoxication without complication [facility])    ED Discharge Orders     None          Laurice Maude BROCKS, MD 01/10/24 1332

## 2024-01-10 NOTE — Discharge Instructions (Addendum)
Return for any problem.  Drink alcohol in moderation. 

## 2024-01-10 NOTE — ED Triage Notes (Signed)
 Pt brought in by roommate who left patient in triage, patient not the best historian. States he doesn't know why he is here. Patient denies pain. States he drank, last drink was last night. Has not been eating and drinking

## 2024-01-30 DEATH — deceased
